# Patient Record
Sex: Female | Born: 1965 | Race: Black or African American | Hispanic: No | Marital: Single | Smoking: Never smoker
Health system: Southern US, Community
[De-identification: ages and names within clinical notes are randomized; demographics above are authoritative.]

## PROBLEM LIST (undated history)

## (undated) DIAGNOSIS — C189 Malignant neoplasm of colon, unspecified: Secondary | ICD-10-CM

## (undated) DIAGNOSIS — J45909 Unspecified asthma, uncomplicated: Secondary | ICD-10-CM

## (undated) DIAGNOSIS — E039 Hypothyroidism, unspecified: Secondary | ICD-10-CM

## (undated) DIAGNOSIS — I10 Essential (primary) hypertension: Secondary | ICD-10-CM

## (undated) DIAGNOSIS — E282 Polycystic ovarian syndrome: Secondary | ICD-10-CM

## (undated) HISTORY — PX: COLON SURGERY: SHX602

## (undated) HISTORY — PX: ABDOMINAL HYSTERECTOMY: SHX81

---

## 2007-06-27 DIAGNOSIS — C20 Malignant neoplasm of rectum: Secondary | ICD-10-CM | POA: Insufficient documentation

## 2008-04-20 DIAGNOSIS — Z85048 Personal history of other malignant neoplasm of rectum, rectosigmoid junction, and anus: Secondary | ICD-10-CM | POA: Insufficient documentation

## 2008-04-20 HISTORY — PX: COLOSTOMY: SHX63

## 2008-04-20 HISTORY — PX: ABDOMINOPERINEAL PROCTOCOLECTOMY: SUR8

## 2011-07-18 DIAGNOSIS — K432 Incisional hernia without obstruction or gangrene: Secondary | ICD-10-CM | POA: Insufficient documentation

## 2011-07-18 DIAGNOSIS — IMO0002 Reserved for concepts with insufficient information to code with codable children: Secondary | ICD-10-CM | POA: Insufficient documentation

## 2013-01-26 DIAGNOSIS — D704 Cyclic neutropenia: Secondary | ICD-10-CM | POA: Insufficient documentation

## 2015-09-19 DIAGNOSIS — I10 Essential (primary) hypertension: Secondary | ICD-10-CM | POA: Diagnosis present

## 2015-09-19 DIAGNOSIS — F5101 Primary insomnia: Secondary | ICD-10-CM | POA: Diagnosis present

## 2015-09-19 DIAGNOSIS — D649 Anemia, unspecified: Secondary | ICD-10-CM | POA: Insufficient documentation

## 2015-09-19 DIAGNOSIS — K219 Gastro-esophageal reflux disease without esophagitis: Secondary | ICD-10-CM | POA: Diagnosis present

## 2015-09-25 DIAGNOSIS — Z6841 Body Mass Index (BMI) 40.0 and over, adult: Secondary | ICD-10-CM

## 2015-09-25 DIAGNOSIS — E039 Hypothyroidism, unspecified: Secondary | ICD-10-CM | POA: Insufficient documentation

## 2015-09-25 DIAGNOSIS — E282 Polycystic ovarian syndrome: Secondary | ICD-10-CM | POA: Diagnosis present

## 2015-10-07 ENCOUNTER — Emergency Department (HOSPITAL_BASED_OUTPATIENT_CLINIC_OR_DEPARTMENT_OTHER)
Admission: EM | Admit: 2015-10-07 | Discharge: 2015-10-07 | Disposition: A | Discharge status: Home / Self Care | Payer: BLUE CROSS/BLUE SHIELD | Attending: Emergency Medicine | Admitting: Emergency Medicine

## 2015-10-07 ENCOUNTER — Emergency Department (HOSPITAL_BASED_OUTPATIENT_CLINIC_OR_DEPARTMENT_OTHER): Payer: BLUE CROSS/BLUE SHIELD

## 2015-10-07 ENCOUNTER — Encounter (HOSPITAL_BASED_OUTPATIENT_CLINIC_OR_DEPARTMENT_OTHER): Payer: Self-pay | Admitting: *Deleted

## 2015-10-07 DIAGNOSIS — K439 Ventral hernia without obstruction or gangrene: Secondary | ICD-10-CM

## 2015-10-07 DIAGNOSIS — Z7984 Long term (current) use of oral hypoglycemic drugs: Secondary | ICD-10-CM | POA: Insufficient documentation

## 2015-10-07 DIAGNOSIS — Z85038 Personal history of other malignant neoplasm of large intestine: Secondary | ICD-10-CM | POA: Insufficient documentation

## 2015-10-07 DIAGNOSIS — Z7982 Long term (current) use of aspirin: Secondary | ICD-10-CM | POA: Diagnosis not present

## 2015-10-07 DIAGNOSIS — R1013 Epigastric pain: Secondary | ICD-10-CM

## 2015-10-07 DIAGNOSIS — R112 Nausea with vomiting, unspecified: Secondary | ICD-10-CM

## 2015-10-07 DIAGNOSIS — I10 Essential (primary) hypertension: Secondary | ICD-10-CM | POA: Insufficient documentation

## 2015-10-07 DIAGNOSIS — E119 Type 2 diabetes mellitus without complications: Secondary | ICD-10-CM | POA: Diagnosis not present

## 2015-10-07 DIAGNOSIS — J45909 Unspecified asthma, uncomplicated: Secondary | ICD-10-CM | POA: Insufficient documentation

## 2015-10-07 HISTORY — DX: Essential (primary) hypertension: I10

## 2015-10-07 HISTORY — DX: Polycystic ovarian syndrome: E28.2

## 2015-10-07 HISTORY — DX: Malignant neoplasm of colon, unspecified: C18.9

## 2015-10-07 HISTORY — DX: Unspecified asthma, uncomplicated: J45.909

## 2015-10-07 LAB — URINALYSIS, ROUTINE W REFLEX MICROSCOPIC
BILIRUBIN URINE: NEGATIVE
Glucose, UA: NEGATIVE mg/dL
Hgb urine dipstick: NEGATIVE
KETONES UR: NEGATIVE mg/dL
LEUKOCYTES UA: NEGATIVE
NITRITE: NEGATIVE
PH: 7 (ref 5.0–8.0)
Protein, ur: NEGATIVE mg/dL
Specific Gravity, Urine: 1.007 (ref 1.005–1.030)

## 2015-10-07 LAB — CBC WITH DIFFERENTIAL/PLATELET
BASOS ABS: 0 10*3/uL (ref 0.0–0.1)
BASOS PCT: 0 %
Eosinophils Absolute: 0 10*3/uL (ref 0.0–0.7)
Eosinophils Relative: 1 %
HEMATOCRIT: 38.3 % (ref 36.0–46.0)
HEMOGLOBIN: 12.6 g/dL (ref 12.0–15.0)
Lymphocytes Relative: 16 %
Lymphs Abs: 0.9 10*3/uL (ref 0.7–4.0)
MCH: 27.3 pg (ref 26.0–34.0)
MCHC: 32.9 g/dL (ref 30.0–36.0)
MCV: 83.1 fL (ref 78.0–100.0)
Monocytes Absolute: 0.5 10*3/uL (ref 0.1–1.0)
Monocytes Relative: 8 %
NEUTROS ABS: 4.5 10*3/uL (ref 1.7–7.7)
NEUTROS PCT: 75 %
Platelets: 256 10*3/uL (ref 150–400)
RBC: 4.61 MIL/uL (ref 3.87–5.11)
RDW: 14.2 % (ref 11.5–15.5)
WBC: 5.9 10*3/uL (ref 4.0–10.5)

## 2015-10-07 LAB — COMPREHENSIVE METABOLIC PANEL
ALBUMIN: 4.3 g/dL (ref 3.5–5.0)
ALT: 34 U/L (ref 14–54)
ANION GAP: 10 (ref 5–15)
AST: 29 U/L (ref 15–41)
Alkaline Phosphatase: 74 U/L (ref 38–126)
BILIRUBIN TOTAL: 0.8 mg/dL (ref 0.3–1.2)
BUN: 12 mg/dL (ref 6–20)
CO2: 26 mmol/L (ref 22–32)
Calcium: 9.3 mg/dL (ref 8.9–10.3)
Chloride: 100 mmol/L — ABNORMAL LOW (ref 101–111)
Creatinine, Ser: 0.76 mg/dL (ref 0.44–1.00)
GFR calc Af Amer: >60 mL/min (ref >60)
GLUCOSE: 113 mg/dL — AB (ref 65–99)
POTASSIUM: 3.8 mmol/L (ref 3.5–5.1)
Sodium: 136 mmol/L (ref 135–145)
TOTAL PROTEIN: 8.3 g/dL — AB (ref 6.5–8.1)

## 2015-10-07 LAB — LIPASE, BLOOD: LIPASE: 23 U/L (ref 11–51)

## 2015-10-07 MED ORDER — SODIUM CHLORIDE 0.9 % IV SOLN
Freq: Once | INTRAVENOUS | Status: AC
  Administered 2015-10-07: 07:00:00 via INTRAVENOUS

## 2015-10-07 MED ORDER — ONDANSETRON 4 MG PO TBDP: 4.0000 mg | ORAL_TABLET | Freq: Three times a day (TID) | ORAL | 0 refills | Status: DC | PRN

## 2015-10-07 MED ORDER — FENTANYL CITRATE (PF) 100 MCG/2ML IJ SOLN: 100.0000 ug | INTRAMUSCULAR | Status: DC | PRN

## 2015-10-07 MED ORDER — ONDANSETRON HCL 4 MG/2ML IJ SOLN
4.0000 mg | Freq: Once | INTRAMUSCULAR | Status: AC
Start: 2015-10-07 — End: 2015-10-07
  Administered 2015-10-07: 4 mg via INTRAVENOUS
  Filled 2015-10-07: qty 2

## 2015-10-07 MED ORDER — IOPAMIDOL (ISOVUE-300) INJECTION 61%
100.0000 mL | Freq: Once | INTRAVENOUS | Status: AC | PRN
  Administered 2015-10-07: 100 mL via INTRAVENOUS

## 2015-10-07 NOTE — ED Triage Notes (Signed)
Pt reports generalized abdominal pain x 1 day.  N/V that started this morning.  Vomited x 1 today-relief with vomiting.  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

## 2015-10-07 NOTE — ED Provider Notes (Signed)
[facility] DEPT [facility] Provider Note   CSN: [id number] Arrival date & time: 10/07/15  0608  First Provider Contact:  None       History   Chief Complaint Chief Complaint  Patient presents with  . Abdominal Pain    HPI Cindy Peterson is a 50 y.o. 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 She is here with abdominal pain that began yesterday morning. The pain is located primarily in the epigastrium and radiates around to the back. It is crampy in nature and has been waxing and waning. It is been severe at its worst but is currently about a 6/10. She had an episode of nausea and vomiting this morning about 5:30. She had multiple episodes of emesis and around which has subsequently subsided. She denies fever, abdominal distention or change in her colostomy output.  HPI  Past Medical History:  Diagnosis Date  . Asthma   . Colon cancer ([place])   . Diabetes mellitus without complication ([place])   . Hypertension   . PCOS (polycystic ovarian syndrome)     There are no active problems to display for this patient.   Past Surgical History:  Procedure Laterality Date  . ABDOMINAL HYSTERECTOMY    . COLON SURGERY    . COLOSTOMY      OB History    No data available       Home Medications    Prior to Admission medications   Medication Sig Start Date End Date Taking? Authorizing Provider  aspirin 81 MG tablet Take 81 mg by mouth daily.   Yes Historical Provider, MD  fluticasone (FLOVENT HFA) 44 MCG/ACT inhaler Inhale into the lungs 2 (two) times daily.   Yes Historical Provider, MD  losartan-hydrochlorothiazide (HYZAAR) 100-12.5 MG tablet Take 1 tablet by mouth daily.   Yes Historical Provider, MD  metFORMIN (GLUCOPHAGE) 500 MG tablet Take by mouth 2 (two) times daily with a meal.   Yes Historical Provider, MD  ondansetron (ZOFRAN ODT) 4 MG disintegrating tablet Take 1 tablet (4 mg total) by mouth every 8  (eight) hours as needed for nausea or vomiting. 10/07/15   Sherwood Gambler, MD    Family History History reviewed. No pertinent family history.  Social History Social History  Substance Use Topics  . Smoking status: Never Smoker  . Smokeless tobacco: Never Used  . Alcohol use No     Allergies   Latex   Review of Systems Review of Systems  All other systems reviewed and are negative.    Physical Exam Updated Vital Signs BP 111/87 (BP Location: Right Arm)   Pulse 92   Temp 98.3 F (36.8 C) (Oral)   Resp 16   Ht 5' 3.5" (1.613 m)   Wt 250 lb (113.4 kg)   SpO2 96%   BMI 43.59 kg/m   Physical Exam General: Well-developed, well-nourished female in no acute distress; appearance consistent with age of record HENT: normocephalic; atraumatic Eyes: pupils equal, round and reactive to light; extraocular muscles intact Neck: supple Heart: regular rate and rhythm Lungs: clear to auscultation bilaterally Abdomen: soft; nondistended; epigastric tenderness; no masses or hepatosplenomegaly; bowel sounds present; colostomy in left lower quadrant Extremities: No deformity; full range of motion; pulses normal Neurologic: Awake, alert and oriented; motor function intact in all extremities and symmetric; no facial droop Skin: Warm and dry Psychiatric: Normal mood and affect    ED Treatments / Results  Nursing  notes and vitals signs, including pulse oximetry, reviewed.  Summary of this visit's results, reviewed by myself:  Labs:  Results for orders placed or performed during the hospital encounter of 10/07/15 (from the past 24 hour(s))  Comprehensive metabolic panel     Status: Abnormal   Collection Time: 10/07/15  6:50 AM  Result Value Ref Range   Sodium 136 135 - 145 mmol/L   Potassium 3.8 3.5 - 5.1 mmol/L   Chloride 100 (L) 101 - 111 mmol/L   CO2 26 22 - 32 mmol/L   Glucose, Bld 113 (H) 65 - 99 mg/dL   BUN 12 6 - 20 mg/dL   Creatinine, Ser 0.76 0.44 - 1.00 mg/dL    Calcium 9.3 8.9 - 10.3 mg/dL   Total Protein 8.3 (H) 6.5 - 8.1 g/dL   Albumin 4.3 3.5 - 5.0 g/dL   AST 29 15 - 41 U/L   ALT 34 14 - 54 U/L   Alkaline Phosphatase 74 38 - 126 U/L   Total Bilirubin 0.8 0.3 - 1.2 mg/dL   GFR calc non Af Amer >60 >60 mL/min   GFR calc Af Amer >60 >60 mL/min   Anion gap 10 5 - 15  Lipase, blood     Status: None   Collection Time: 10/07/15  6:50 AM  Result Value Ref Range   Lipase 23 11 - 51 U/L  CBC with Differential/Platelet     Status: None   Collection Time: 10/07/15  6:50 AM  Result Value Ref Range   WBC 5.9 4.0 - 10.5 K/uL   RBC 4.61 3.87 - 5.11 MIL/uL   Hemoglobin 12.6 12.0 - 15.0 g/dL   HCT 38.3 36.0 - 46.0 %   MCV 83.1 78.0 - 100.0 fL   MCH 27.3 26.0 - 34.0 pg   MCHC 32.9 30.0 - 36.0 g/dL   RDW 14.2 11.5 - 15.5 %   Platelets 256 150 - 400 K/uL   Neutrophils Relative % 75 %   Neutro Abs 4.5 1.7 - 7.7 K/uL   Lymphocytes Relative 16 %   Lymphs Abs 0.9 0.7 - 4.0 K/uL   Monocytes Relative 8 %   Monocytes Absolute 0.5 0.1 - 1.0 K/uL   Eosinophils Relative 1 %   Eosinophils Absolute 0.0 0.0 - 0.7 K/uL   Basophils Relative 0 %   Basophils Absolute 0.0 0.0 - 0.1 K/uL  Urinalysis, Routine w reflex microscopic (not at [facility])     Status: None   Collection Time: 10/07/15  7:53 AM  Result Value Ref Range   Color, Urine YELLOW YELLOW   APPearance CLEAR CLEAR   Specific Gravity, Urine 1.007 1.005 - 1.030   pH 7.0 5.0 - 8.0   Glucose, UA NEGATIVE NEGATIVE mg/dL   Hgb urine dipstick NEGATIVE NEGATIVE   Bilirubin Urine NEGATIVE NEGATIVE   Ketones, ur NEGATIVE NEGATIVE mg/dL   Protein, ur NEGATIVE NEGATIVE mg/dL   Nitrite NEGATIVE NEGATIVE   Leukocytes, UA NEGATIVE NEGATIVE    Imaging Studies: Ct Abdomen Pelvis W Contrast  Result Date: 10/07/2015 CLINICAL DATA:  Epigastric pain, nausea, and vomiting for 1 day. Colon cancer with colostomy. EXAM: CT ABDOMEN AND PELVIS WITH CONTRAST TECHNIQUE: Multidetector CT imaging of the abdomen and pelvis was  performed using the standard protocol following bolus administration of intravenous contrast. CONTRAST:  12mL ISOVUE-300 IOPAMIDOL (ISOVUE-300) INJECTION 61% COMPARISON:  04/21/2009 FINDINGS: No lung base nodules. Soft tissue density is layering in the gallbladder. There are calcifications either at the periphery of this  density or in the wall of the gallbladder. No wall thickening of the gallbladder. Liver, spleen, pancreas, adrenal glands, and kidneys are within normal limits The patient is status post AP are. Left lower quadrant colostomy is stable. A peristomal hernia in the left lower quadrant containing small bowel loops is again noted. The hernia has worsened with an increasing amount of small bowel. There are also 2 adjacent midline hernia defects containing bowel loops. The most inferior hernia sac contains a bowel loop with an air-fluid level. This segment of small bowel is mildly dilated and there is wall thickened. There is possible edema in the adjacent mesenteric. See images 57 through 63 of series 2. There is no dilatation of proximal small bowel to suggest obstruction. Air-fluid levels throughout the colon are nonspecific. The see come is in the deep pelvis and the appendix is normal. Presacral soft tissue thickening is stable. There is no free fluid. There is no abnormal retroperitoneal adenopathy by measurement criteria. Soft tissue adjacent to the left external iliac artery on image 65 is stable and likely represents ovarian tissue. No vertebral compression deformity. Anterolisthesis L4 upon L5. Advanced facet arthropathy in the lumbar spine. IMPRESSION: There are multiple lower abdominal anterior hernia sacs as described. The most inferior a midline hernia sac contains a loop of small bowel with wall thickening. An inflammatory process and/or incarcerated hernia with venous congestion cannot be excluded. There is no evidence of bowel obstruction. Small bowel loops within the left lower quadrant  peristomal hernia have increased. There are calcifications within the region of the gallbladder representing either gallstones or gallbladder wall calcification. Ultrasound may be helpful. Electronically Signed   By: Marybelle Killings M.D.   On: 10/07/2015 08:50     Procedures (including critical care time)   Final Clinical Impressions(s) / ED Diagnoses   Final diagnoses:  Epigastric pain  Ventral hernia without obstruction or gangrene  Nausea and vomiting in adult      Shanon Rosser, MD 10/07/15 2244

## 2015-10-07 NOTE — ED Provider Notes (Signed)
Patient feels well. Reports no abdominal pain, no current nausea. On my exam I do not feel an obvious hernia or obvious tenderness. CT shows hernia with possible early incarceration be given no pain at this is less likely. Discussed with Dr. Lucia Gaskins of [place], who reviewed CT scan and feels she is stable for discharge home, which I agree. She is to follow-up with her surgeon who she can follow-up with about possible outpatient fixation. I did discussed strict return precautions including recurrent abdominal pain, nausea, vomiting, or other concerning symptoms.  Results for orders placed or performed during the hospital encounter of 10/07/15  Urinalysis, Routine w reflex microscopic (not at [facility])  Result Value Ref Range   Color, Urine YELLOW YELLOW   APPearance CLEAR CLEAR   Specific Gravity, Urine 1.007 1.005 - 1.030   pH 7.0 5.0 - 8.0   Glucose, UA NEGATIVE NEGATIVE mg/dL   Hgb urine dipstick NEGATIVE NEGATIVE   Bilirubin Urine NEGATIVE NEGATIVE   Ketones, ur NEGATIVE NEGATIVE mg/dL   Protein, ur NEGATIVE NEGATIVE mg/dL   Nitrite NEGATIVE NEGATIVE   Leukocytes, UA NEGATIVE NEGATIVE  Comprehensive metabolic panel  Result Value Ref Range   Sodium 136 135 - 145 mmol/L   Potassium 3.8 3.5 - 5.1 mmol/L   Chloride 100 (L) 101 - 111 mmol/L   CO2 26 22 - 32 mmol/L   Glucose, Bld 113 (H) 65 - 99 mg/dL   BUN 12 6 - 20 mg/dL   Creatinine, Ser 0.76 0.44 - 1.00 mg/dL   Calcium 9.3 8.9 - 10.3 mg/dL   Total Protein 8.3 (H) 6.5 - 8.1 g/dL   Albumin 4.3 3.5 - 5.0 g/dL   AST 29 15 - 41 U/L   ALT 34 14 - 54 U/L   Alkaline Phosphatase 74 38 - 126 U/L   Total Bilirubin 0.8 0.3 - 1.2 mg/dL   GFR calc non Af Amer >60 >60 mL/min   GFR calc Af Amer >60 >60 mL/min   Anion gap 10 5 - 15  Lipase, blood  Result Value Ref Range   Lipase 23 11 - 51 U/L  CBC with Differential/Platelet  Result Value Ref Range   WBC 5.9 4.0 - 10.5 K/uL   RBC 4.61 3.87 - 5.11 MIL/uL   Hemoglobin 12.6 12.0 - 15.0 g/dL   HCT 38.3  36.0 - 46.0 %   MCV 83.1 78.0 - 100.0 fL   MCH 27.3 26.0 - 34.0 pg   MCHC 32.9 30.0 - 36.0 g/dL   RDW 14.2 11.5 - 15.5 %   Platelets 256 150 - 400 K/uL   Neutrophils Relative % 75 %   Neutro Abs 4.5 1.7 - 7.7 K/uL   Lymphocytes Relative 16 %   Lymphs Abs 0.9 0.7 - 4.0 K/uL   Monocytes Relative 8 %   Monocytes Absolute 0.5 0.1 - 1.0 K/uL   Eosinophils Relative 1 %   Eosinophils Absolute 0.0 0.0 - 0.7 K/uL   Basophils Relative 0 %   Basophils Absolute 0.0 0.0 - 0.1 K/uL   Ct Abdomen Pelvis W Contrast  Result Date: 10/07/2015 CLINICAL DATA:  Epigastric pain, nausea, and vomiting for 1 day. Colon cancer with colostomy. EXAM: CT ABDOMEN AND PELVIS WITH CONTRAST TECHNIQUE: Multidetector CT imaging of the abdomen and pelvis was performed using the standard protocol following bolus administration of intravenous contrast. CONTRAST:  159mL ISOVUE-300 IOPAMIDOL (ISOVUE-300) INJECTION 61% COMPARISON:  04/21/2009 FINDINGS: No lung base nodules. Soft tissue density is layering in the gallbladder. There  are calcifications either at the periphery of this density or in the wall of the gallbladder. No wall thickening of the gallbladder. Liver, spleen, pancreas, adrenal glands, and kidneys are within normal limits The patient is status post AP are. Left lower quadrant colostomy is stable. A peristomal hernia in the left lower quadrant containing small bowel loops is again noted. The hernia has worsened with an increasing amount of small bowel. There are also 2 adjacent midline hernia defects containing bowel loops. The most inferior hernia sac contains a bowel loop with an air-fluid level. This segment of small bowel is mildly dilated and there is wall thickened. There is possible edema in the adjacent mesenteric. See images 57 through 63 of series 2. There is no dilatation of proximal small bowel to suggest obstruction. Air-fluid levels throughout the colon are nonspecific. The see come is in the deep pelvis and  the appendix is normal. Presacral soft tissue thickening is stable. There is no free fluid. There is no abnormal retroperitoneal adenopathy by measurement criteria. Soft tissue adjacent to the left external iliac artery on image 65 is stable and likely represents ovarian tissue. No vertebral compression deformity. Anterolisthesis L4 upon L5. Advanced facet arthropathy in the lumbar spine. IMPRESSION: There are multiple lower abdominal anterior hernia sacs as described. The most inferior a midline hernia sac contains a loop of small bowel with wall thickening. An inflammatory process and/or incarcerated hernia with venous congestion cannot be excluded. There is no evidence of bowel obstruction. Small bowel loops within the left lower quadrant peristomal hernia have increased. There are calcifications within the region of the gallbladder representing either gallstones or gallbladder wall calcification. Ultrasound may be helpful. Electronically Signed   By: Marybelle Killings M.D.   On: 10/07/2015 08:50      Sherwood Gambler, MD 10/07/15 [phone]

## 2016-04-04 DIAGNOSIS — R7989 Other specified abnormal findings of blood chemistry: Secondary | ICD-10-CM | POA: Insufficient documentation

## 2016-05-23 DIAGNOSIS — J4531 Mild persistent asthma with (acute) exacerbation: Secondary | ICD-10-CM | POA: Insufficient documentation

## 2016-12-06 DIAGNOSIS — H50112 Monocular exotropia, left eye: Secondary | ICD-10-CM | POA: Insufficient documentation

## 2016-12-06 DIAGNOSIS — H04123 Dry eye syndrome of bilateral lacrimal glands: Secondary | ICD-10-CM | POA: Insufficient documentation

## 2016-12-06 DIAGNOSIS — Z83511 Family history of glaucoma: Secondary | ICD-10-CM | POA: Insufficient documentation

## 2016-12-06 DIAGNOSIS — H5203 Hypermetropia, bilateral: Secondary | ICD-10-CM | POA: Insufficient documentation

## 2017-03-05 DIAGNOSIS — D509 Iron deficiency anemia, unspecified: Secondary | ICD-10-CM | POA: Insufficient documentation

## 2017-04-05 ENCOUNTER — Emergency Department (HOSPITAL_BASED_OUTPATIENT_CLINIC_OR_DEPARTMENT_OTHER)
Admission: EM | Admit: 2017-04-05 | Discharge: 2017-04-05 | Disposition: A | Discharge status: Home / Self Care | Payer: Managed Care, Other (non HMO) | Attending: Physician Assistant | Admitting: Physician Assistant

## 2017-04-05 ENCOUNTER — Other Ambulatory Visit: Payer: Self-pay

## 2017-04-05 ENCOUNTER — Encounter (HOSPITAL_BASED_OUTPATIENT_CLINIC_OR_DEPARTMENT_OTHER): Payer: Self-pay | Admitting: *Deleted

## 2017-04-05 DIAGNOSIS — J111 Influenza due to unidentified influenza virus with other respiratory manifestations: Secondary | ICD-10-CM

## 2017-04-05 DIAGNOSIS — J45909 Unspecified asthma, uncomplicated: Secondary | ICD-10-CM | POA: Insufficient documentation

## 2017-04-05 DIAGNOSIS — Z79899 Other long term (current) drug therapy: Secondary | ICD-10-CM | POA: Insufficient documentation

## 2017-04-05 DIAGNOSIS — I10 Essential (primary) hypertension: Secondary | ICD-10-CM | POA: Insufficient documentation

## 2017-04-05 DIAGNOSIS — Z85038 Personal history of other malignant neoplasm of large intestine: Secondary | ICD-10-CM | POA: Insufficient documentation

## 2017-04-05 DIAGNOSIS — R69 Illness, unspecified: Secondary | ICD-10-CM

## 2017-04-05 DIAGNOSIS — R509 Fever, unspecified: Secondary | ICD-10-CM | POA: Diagnosis present

## 2017-04-05 DIAGNOSIS — Z91040 Latex allergy status: Secondary | ICD-10-CM | POA: Insufficient documentation

## 2017-04-05 MED ORDER — ACETAMINOPHEN 325 MG PO TABS
650.0000 mg | ORAL_TABLET | Freq: Once | ORAL | Status: AC
  Administered 2017-04-05: 650 mg via ORAL

## 2017-04-05 MED ORDER — IBUPROFEN 800 MG PO TABS
800.0000 mg | ORAL_TABLET | Freq: Once | ORAL | Status: AC
  Administered 2017-04-05: 800 mg via ORAL
  Filled 2017-04-05: qty 1

## 2017-04-05 MED ORDER — ACETAMINOPHEN 325 MG PO TABS
ORAL_TABLET | ORAL | Status: AC
  Filled 2017-04-05: qty 2

## 2017-04-05 NOTE — ED Notes (Signed)
Given OJ to drink.

## 2017-04-05 NOTE — ED Provider Notes (Signed)
[facility] EMERGENCY DEPARTMENT Provider Note   CSN: [id number] Arrival date & time: 04/05/17  1844     History   Chief Complaint Chief Complaint  Patient presents with  . URI    HPI Paulina Muchmore is a 52 y.o. female.  HPI   Is a 52 year old female presenting with fever, body aches.  Patient saw her primary physician yesterday who diagnosed her with a viral illness.  She is worried that she has a flu because her family members had flu recently.  She did not have a fever yesterday when she saw a physician now she is febrile, similar symptoms.  The symptoms are consistent with the flu.  Area of the cast, colon cancer, hypertension.  Patient had no vomiting, has not take any medications to help with fever.  Past Medical History:  Diagnosis Date  . Asthma   . Colon cancer ([place])   . Hypertension   . PCOS (polycystic ovarian syndrome)     There are no active problems to display for this patient.   Past Surgical History:  Procedure Laterality Date  . ABDOMINAL HYSTERECTOMY    . COLON SURGERY    . COLOSTOMY      OB History    No data available       Home Medications    Prior to Admission medications   Medication Sig Start Date End Date Taking? Authorizing Provider  omeprazole (PRILOSEC) 20 MG capsule Take 20 mg by mouth daily.   Yes [provider]  zolpidem (AMBIEN) 5 MG tablet Take 5 mg by mouth at bedtime as needed for sleep.   Yes [provider]  fluticasone (FLOVENT HFA) 44 MCG/ACT inhaler Inhale into the lungs 2 (two) times daily.    [provider]  losartan-hydrochlorothiazide (HYZAAR) 100-12.5 MG tablet Take 1 tablet by mouth daily.    [provider]  metFORMIN (GLUCOPHAGE) 500 MG tablet Take by mouth 2 (two) times daily with a meal.    [provider]    Family History History reviewed. No pertinent family history.  Social History Social History   Tobacco Use  . Smoking status: Never Smoker    . Smokeless tobacco: Never Used  Substance Use Topics  . Alcohol use: No  . Drug use: No     Allergies   Latex   Review of Systems Review of Systems  Constitutional: Positive for fatigue and fever. Negative for activity change.  HENT: Positive for congestion.   Respiratory: Positive for cough. Negative for shortness of breath.   Cardiovascular: Negative for chest pain.  Gastrointestinal: Negative for abdominal pain.  Neurological: Positive for headaches.  All other systems reviewed and are negative.    Physical Exam Updated Vital Signs BP (!) 143/95 (BP Location: Left Arm)   Pulse (!) 118   Temp (!) 101 F (38.3 C) (Oral)   Resp 20   Ht 5\' 3"  (1.6 m)   Wt 122.5 kg (270 lb)   SpO2 98%   BMI 47.83 kg/m   Physical Exam  Constitutional: She is oriented to person, place, and time. She appears well-developed and well-nourished.  HENT:  Head: Normocephalic and atraumatic.  Eyes: EOM are normal. Pupils are equal, round, and reactive to light. Right eye exhibits no discharge. Left eye exhibits no discharge.  Neck:  movign neck normally.  Cardiovascular: Regular rhythm.  Tachycardic.  Pulmonary/Chest: Effort normal and breath sounds normal. No respiratory distress.  Neurological: She is oriented to person, place, and time.  Skin: Skin is warm and dry. She is not diaphoretic.  Psychiatric: She has a normal mood and affect.  Nursing note and vitals reviewed.    ED Treatments / Results  Labs (all labs ordered are listed, but only abnormal results are displayed) Labs Reviewed - No data to display  EKG  EKG Interpretation None       Radiology No results found.  Procedures Procedures (including critical care time)  Medications Ordered in ED Medications  acetaminophen (TYLENOL) 325 MG tablet (not administered)  acetaminophen (TYLENOL) tablet 650 mg (650 mg Oral Given 04/05/17 1909)     Initial Impression / Assessment and Plan / ED Course  I have reviewed  the triage vital signs and the nursing notes.  Pertinent labs & imaging results that were available during my care of the patient were reviewed by me and considered in my medical decision making (see chart for details).     Is a 52 year old female presenting with fever, body aches.  Patient saw her primary physician yesterday who diagnosed her with a viral illness.  She is worried that she has a flu because her family members had flu recently.  She did not have a fever yesterday when she saw a physician now she is febrile, similar symptoms.  The symptoms are consistent with the flu.  Area of the cast, colon cancer, hypertension.  Patient had no vomiting, has not take any medications to help with fever.  9:12 PM Believe patient has a flu.  Discussed Tamiflu with her, patient does not think that it would be a good financial decision for her.  We will give her room for supportive care, ibuprofen, Tylenol, work note.   Final Clinical Impressions(s) / ED Diagnoses   Final diagnoses:  None    ED Discharge Orders    None       Macarthur Critchley, MD 04/05/17 [phone]

## 2017-04-05 NOTE — Discharge Instructions (Signed)
Please take ibuprofen and Tylenol to help with your symptoms.  Please rest and drink plenty of fluids.  Please return with any concerns.

## 2017-04-05 NOTE — ED Notes (Signed)
Pt given d/c instructions as per chart. Verbalizes understanding. No questions. 

## 2017-04-05 NOTE — ED Triage Notes (Signed)
Fever, body aches, cough x 1-2 days.

## 2017-09-18 IMAGING — CT CT ABD-PELV W/ CM
2 of 5 series · 16 of 46 positions shown, 18 images · IV contrast (iopamidol)
Comparison: 04/21/2009

CLINICAL DATA: Epigastric pain, nausea, and vomiting for 1 day.
Colon cancer with colostomy.

EXAM:
CT ABDOMEN AND PELVIS WITH CONTRAST
TECHNIQUE: Multidetector CT imaging of the abdomen and pelvis was performed
using the standard protocol following bolus administration of
intravenous contrast.
CONTRAST:  100mL 3G812B-GYY IOPAMIDOL (3G812B-GYY) INJECTION 61%

[Series 2: axial st · axial · 0.98mm/px · z∈[-456,-42]mm · 13 of 95 slices shown, 15 images]
[im 6/95  soft-tissue]
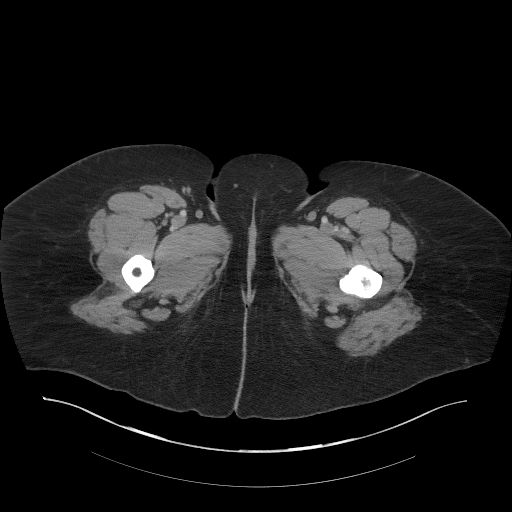
[im 6/95  bone]
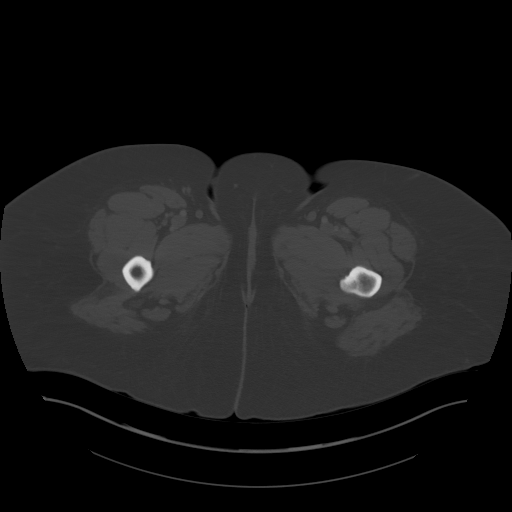
[im 11/95  soft-tissue]
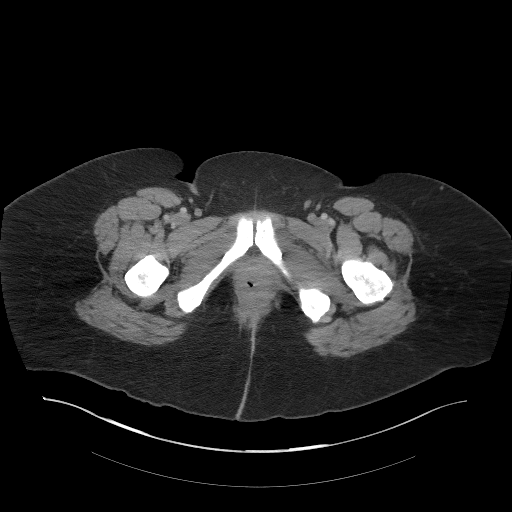
[im 21/95  soft-tissue]
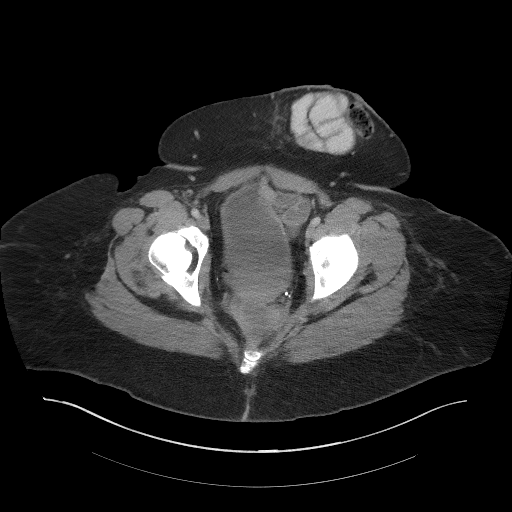
[im 27/95  soft-tissue]
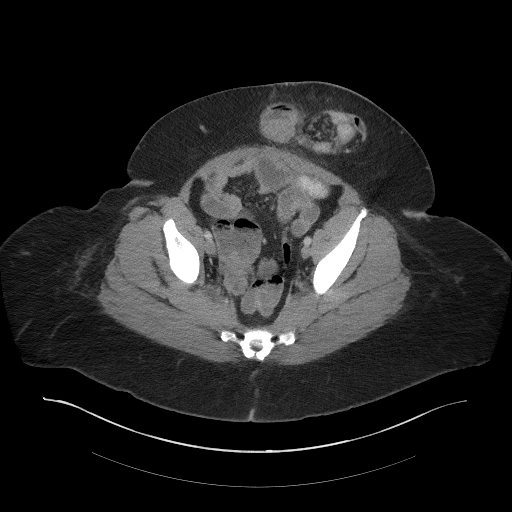
[im 32/95  soft-tissue]
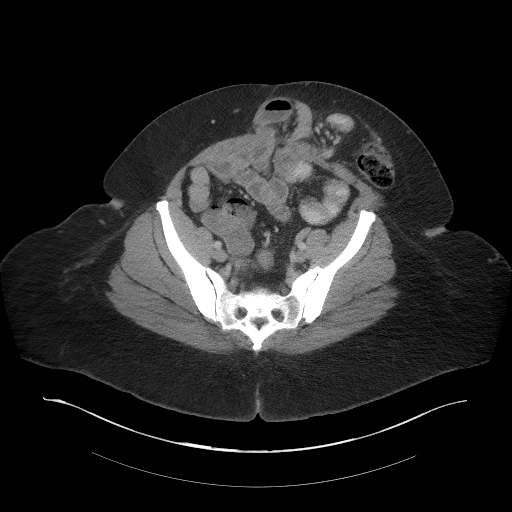
[im 42/95  soft-tissue]
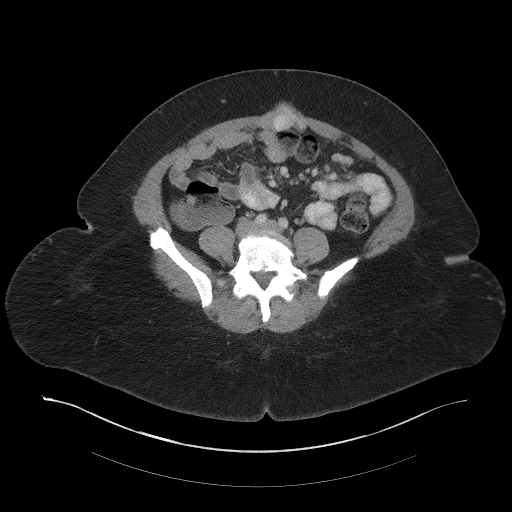
[im 48/95  soft-tissue]
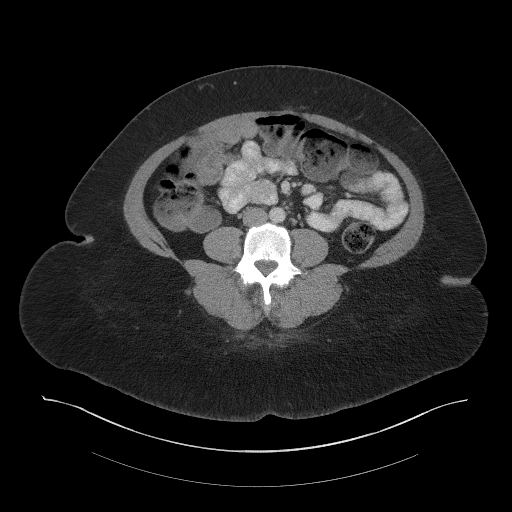
[im 53/95  soft-tissue]
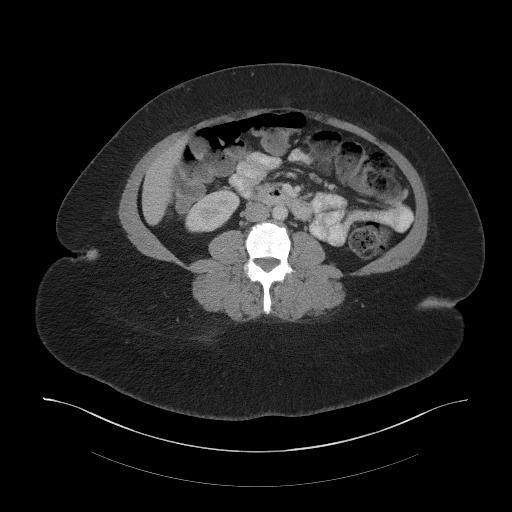
[im 63/95  soft-tissue]
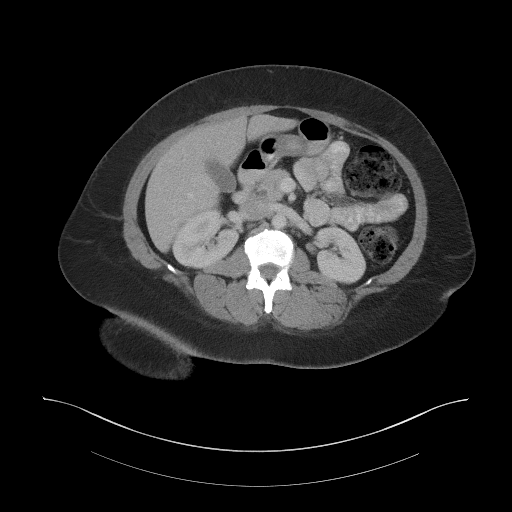
[im 63/95  bone]
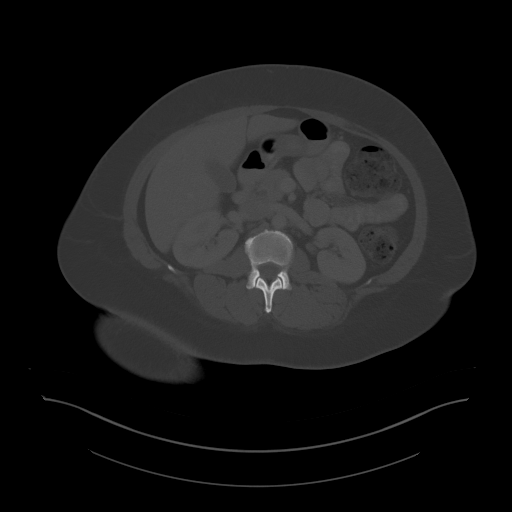
[im 68/95  soft-tissue]
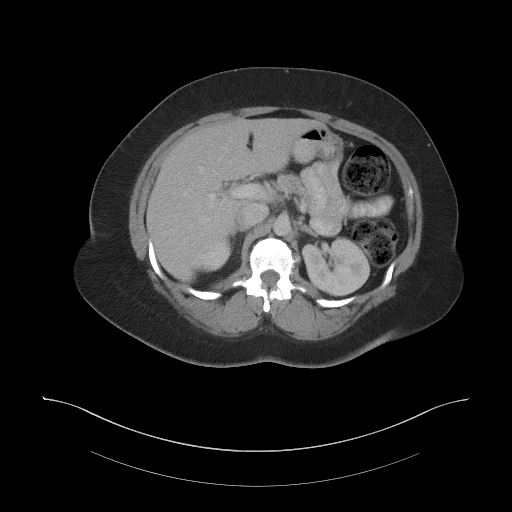
[im 74/95  soft-tissue]
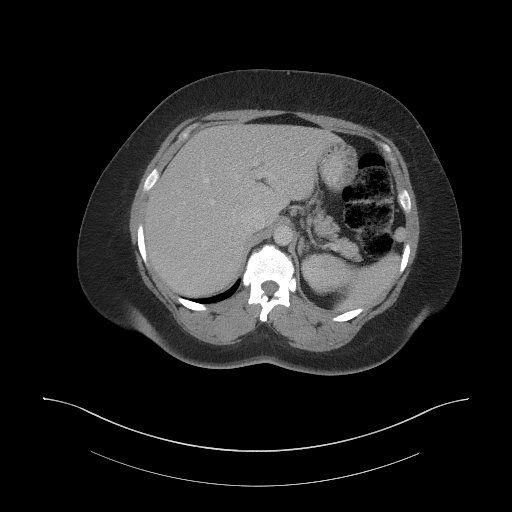
[im 84/95  soft-tissue]
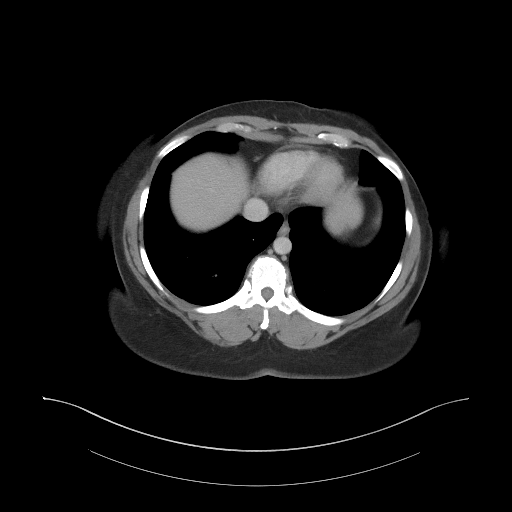
[im 89/95  soft-tissue]
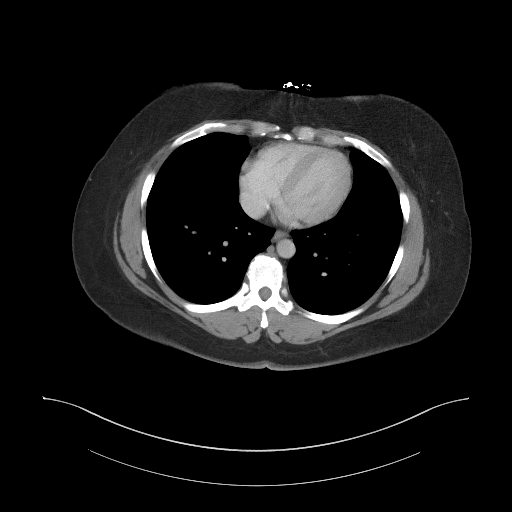

[Series 5: coronal st · coronal · 0.98mm/px · 3 of 92 slices shown]
[im 31/92  soft-tissue]
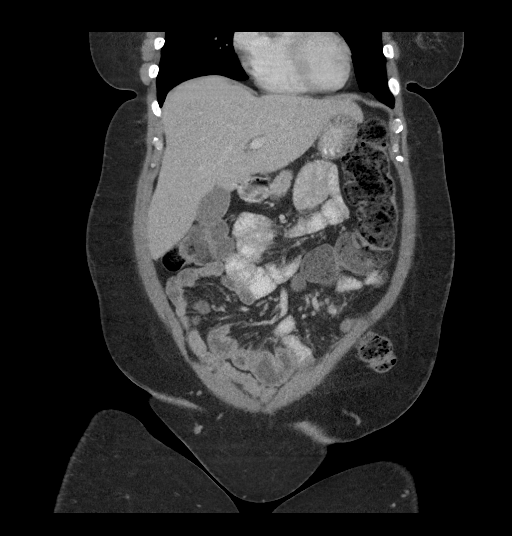
[im 41/92  soft-tissue]
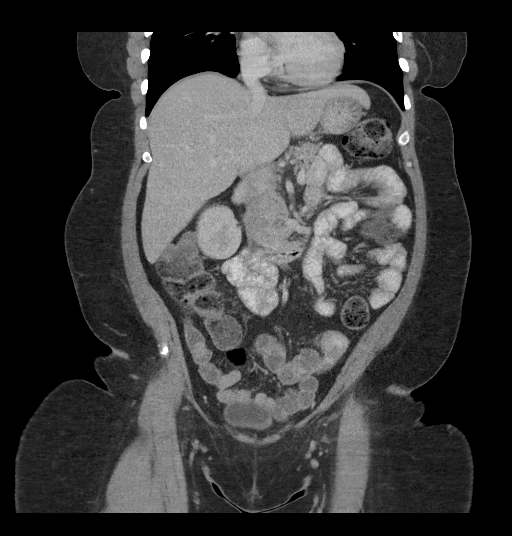
[im 51/92  soft-tissue]
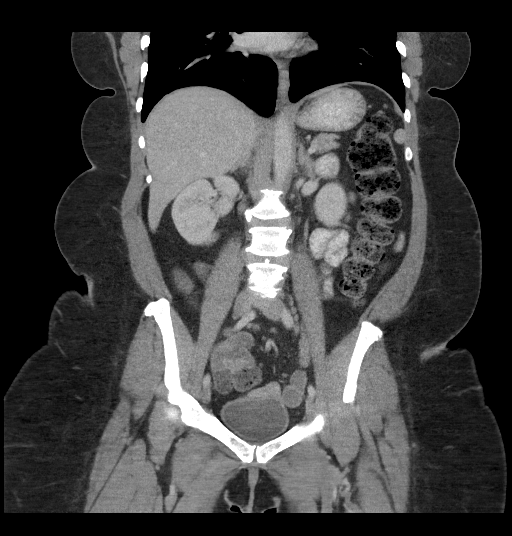

[16 of 46 positions shown; findings below may reference images not displayed]

FINDINGS: No lung base nodules.

Soft tissue density is layering in the gallbladder. There are
calcifications either at the periphery of this density or in the
wall of the gallbladder. No wall thickening of the gallbladder.

Liver, spleen, pancreas, adrenal glands, and kidneys are within
normal limits

The patient is status post AP are. Left lower quadrant colostomy is
stable. A peristomal hernia in the left lower quadrant containing
small bowel loops is again noted. The hernia has worsened with an
increasing amount of small bowel. There are also 2 adjacent midline
hernia defects containing bowel loops. The most inferior hernia sac
contains a bowel loop with an air-fluid level. This segment of small
bowel is mildly dilated and there is wall thickened. There is
possible edema in the adjacent mesenteric. See images 57 through 63
of series 2. There is no dilatation of proximal small bowel to
suggest obstruction. Air-fluid levels throughout the colon are
nonspecific.

The see come is in the deep pelvis and the appendix is normal.
Presacral soft tissue thickening is stable.

There is no free fluid. There is no abnormal retroperitoneal
adenopathy by measurement criteria. Soft tissue adjacent to the left
external iliac artery on image 65 is stable and likely represents
ovarian tissue.

No vertebral compression deformity. Anterolisthesis L4 upon L5.
Advanced facet arthropathy in the lumbar spine.
IMPRESSION: There are multiple lower abdominal anterior hernia sacs as
described. The most inferior a midline hernia sac contains a loop of
small bowel with wall thickening. An inflammatory process and/or
incarcerated hernia with venous congestion cannot be excluded. There
is no evidence of bowel obstruction. Small bowel loops within the
left lower quadrant peristomal hernia have increased.

There are calcifications within the region of the gallbladder
representing either gallstones or gallbladder wall calcification.
Ultrasound may be helpful.

## 2018-09-26 HISTORY — PX: LAPAROSCOPIC CHOLECYSTECTOMY: SUR755

## 2019-10-13 DIAGNOSIS — R7301 Impaired fasting glucose: Secondary | ICD-10-CM | POA: Insufficient documentation

## 2020-01-24 ENCOUNTER — Inpatient Hospital Stay (HOSPITAL_BASED_OUTPATIENT_CLINIC_OR_DEPARTMENT_OTHER)
Admission: EM | Admit: 2020-01-24 | Discharge: 2020-02-08 | DRG: 336 | Disposition: A | Discharge status: Home / Self Care | Payer: BC Managed Care – PPO | Attending: Internal Medicine | Admitting: Internal Medicine

## 2020-01-24 ENCOUNTER — Emergency Department (HOSPITAL_BASED_OUTPATIENT_CLINIC_OR_DEPARTMENT_OTHER): Payer: BC Managed Care – PPO

## 2020-01-24 ENCOUNTER — Other Ambulatory Visit: Payer: Self-pay

## 2020-01-24 ENCOUNTER — Encounter (HOSPITAL_BASED_OUTPATIENT_CLINIC_OR_DEPARTMENT_OTHER): Payer: Self-pay | Admitting: *Deleted

## 2020-01-24 DIAGNOSIS — Z8249 Family history of ischemic heart disease and other diseases of the circulatory system: Secondary | ICD-10-CM | POA: Diagnosis not present

## 2020-01-24 DIAGNOSIS — Z9049 Acquired absence of other specified parts of digestive tract: Secondary | ICD-10-CM

## 2020-01-24 DIAGNOSIS — Z6841 Body Mass Index (BMI) 40.0 and over, adult: Secondary | ICD-10-CM | POA: Diagnosis not present

## 2020-01-24 DIAGNOSIS — E282 Polycystic ovarian syndrome: Secondary | ICD-10-CM | POA: Diagnosis present

## 2020-01-24 DIAGNOSIS — Z20822 Contact with and (suspected) exposure to covid-19: Secondary | ICD-10-CM | POA: Diagnosis present

## 2020-01-24 DIAGNOSIS — Z933 Colostomy status: Secondary | ICD-10-CM | POA: Diagnosis not present

## 2020-01-24 DIAGNOSIS — Z85048 Personal history of other malignant neoplasm of rectum, rectosigmoid junction, and anus: Secondary | ICD-10-CM

## 2020-01-24 DIAGNOSIS — K660 Peritoneal adhesions (postprocedural) (postinfection): Secondary | ICD-10-CM | POA: Diagnosis present

## 2020-01-24 DIAGNOSIS — R112 Nausea with vomiting, unspecified: Secondary | ICD-10-CM

## 2020-01-24 DIAGNOSIS — K219 Gastro-esophageal reflux disease without esophagitis: Secondary | ICD-10-CM | POA: Diagnosis present

## 2020-01-24 DIAGNOSIS — E119 Type 2 diabetes mellitus without complications: Secondary | ICD-10-CM | POA: Diagnosis present

## 2020-01-24 DIAGNOSIS — K433 Parastomal hernia with obstruction, without gangrene: Principal | ICD-10-CM | POA: Diagnosis present

## 2020-01-24 DIAGNOSIS — Z800 Family history of malignant neoplasm of digestive organs: Secondary | ICD-10-CM | POA: Diagnosis not present

## 2020-01-24 DIAGNOSIS — K435 Parastomal hernia without obstruction or  gangrene: Secondary | ICD-10-CM | POA: Diagnosis present

## 2020-01-24 DIAGNOSIS — E876 Hypokalemia: Secondary | ICD-10-CM | POA: Diagnosis present

## 2020-01-24 DIAGNOSIS — Z7984 Long term (current) use of oral hypoglycemic drugs: Secondary | ICD-10-CM

## 2020-01-24 DIAGNOSIS — K432 Incisional hernia without obstruction or gangrene: Secondary | ICD-10-CM | POA: Diagnosis present

## 2020-01-24 DIAGNOSIS — J45909 Unspecified asthma, uncomplicated: Secondary | ICD-10-CM | POA: Diagnosis present

## 2020-01-24 DIAGNOSIS — F5101 Primary insomnia: Secondary | ICD-10-CM | POA: Diagnosis present

## 2020-01-24 DIAGNOSIS — G4730 Sleep apnea, unspecified: Secondary | ICD-10-CM | POA: Diagnosis present

## 2020-01-24 DIAGNOSIS — Z833 Family history of diabetes mellitus: Secondary | ICD-10-CM | POA: Diagnosis not present

## 2020-01-24 DIAGNOSIS — K5909 Other constipation: Secondary | ICD-10-CM | POA: Diagnosis not present

## 2020-01-24 DIAGNOSIS — K567 Ileus, unspecified: Secondary | ICD-10-CM | POA: Diagnosis present

## 2020-01-24 DIAGNOSIS — Z0189 Encounter for other specified special examinations: Secondary | ICD-10-CM

## 2020-01-24 DIAGNOSIS — I10 Essential (primary) hypertension: Secondary | ICD-10-CM | POA: Diagnosis present

## 2020-01-24 DIAGNOSIS — K56609 Unspecified intestinal obstruction, unspecified as to partial versus complete obstruction: Secondary | ICD-10-CM | POA: Diagnosis not present

## 2020-01-24 LAB — URINALYSIS, ROUTINE W REFLEX MICROSCOPIC
Bilirubin Urine: NEGATIVE
Glucose, UA: NEGATIVE mg/dL
Hgb urine dipstick: NEGATIVE
Ketones, ur: 40 mg/dL — AB
Nitrite: NEGATIVE
Protein, ur: NEGATIVE mg/dL
Specific Gravity, Urine: 1.02 (ref 1.005–1.030)
pH: 6 (ref 5.0–8.0)

## 2020-01-24 LAB — RESP PANEL BY RT-PCR (FLU A&B, COVID) ARPGX2
Influenza A by PCR: NEGATIVE
Influenza B by PCR: NEGATIVE
SARS Coronavirus 2 by RT PCR: NEGATIVE

## 2020-01-24 LAB — CBC
HCT: 37.3 % (ref 36.0–46.0)
Hemoglobin: 12.1 g/dL (ref 12.0–15.0)
MCH: 27 pg (ref 26.0–34.0)
MCHC: 32.4 g/dL (ref 30.0–36.0)
MCV: 83.3 fL (ref 80.0–100.0)
Platelets: 280 10*3/uL (ref 150–400)
RBC: 4.48 MIL/uL (ref 3.87–5.11)
RDW: 15.4 % (ref 11.5–15.5)
WBC: 7 10*3/uL (ref 4.0–10.5)
nRBC: 0 % (ref 0.0–0.2)

## 2020-01-24 LAB — COMPREHENSIVE METABOLIC PANEL
ALT: 37 U/L (ref 0–44)
AST: 25 U/L (ref 15–41)
Albumin: 4.7 g/dL (ref 3.5–5.0)
Alkaline Phosphatase: 71 U/L (ref 38–126)
Anion gap: 12 (ref 5–15)
BUN: 13 mg/dL (ref 6–20)
CO2: 23 mmol/L (ref 22–32)
Calcium: 9.3 mg/dL (ref 8.9–10.3)
Chloride: 99 mmol/L (ref 98–111)
Creatinine, Ser: 0.67 mg/dL (ref 0.44–1.00)
GFR, Estimated: >60 mL/min (ref >60)
Glucose, Bld: 154 mg/dL — ABNORMAL HIGH (ref 70–99)
Potassium: 4.2 mmol/L (ref 3.5–5.1)
Sodium: 134 mmol/L — ABNORMAL LOW (ref 135–145)
Total Bilirubin: 0.5 mg/dL (ref 0.3–1.2)
Total Protein: 8.8 g/dL — ABNORMAL HIGH (ref 6.5–8.1)

## 2020-01-24 LAB — URINALYSIS, MICROSCOPIC (REFLEX)

## 2020-01-24 LAB — LACTIC ACID, PLASMA
Lactic Acid, Venous: 2.1 mmol/L (ref 0.5–1.9)
Lactic Acid, Venous: 2.7 mmol/L (ref 0.5–1.9)

## 2020-01-24 LAB — PREGNANCY, URINE: Preg Test, Ur: NEGATIVE

## 2020-01-24 LAB — LIPASE, BLOOD: Lipase: 19 U/L (ref 11–51)

## 2020-01-24 MED ORDER — ONDANSETRON 4 MG PO TBDP: 4.0000 mg | ORAL_TABLET | Freq: Once | ORAL | Status: AC | PRN

## 2020-01-24 MED ORDER — DROPERIDOL 2.5 MG/ML IJ SOLN
2.5000 mg | Freq: Once | INTRAMUSCULAR | Status: AC
  Administered 2020-01-24: 2.5 mg via INTRAVENOUS
  Filled 2020-01-24: qty 2

## 2020-01-24 MED ORDER — SODIUM CHLORIDE 0.9 % IV SOLN: Freq: Once | INTRAVENOUS | Status: AC

## 2020-01-24 MED ORDER — SODIUM CHLORIDE 0.9 % IV BOLUS
1000.0000 mL | Freq: Once | INTRAVENOUS | Status: AC
  Administered 2020-01-24: 1000 mL via INTRAVENOUS

## 2020-01-24 MED ORDER — ONDANSETRON 4 MG PO TBDP
ORAL_TABLET | ORAL | Status: AC
  Administered 2020-01-24: 4 mg via ORAL
  Filled 2020-01-24: qty 1

## 2020-01-24 MED ORDER — FENTANYL CITRATE (PF) 100 MCG/2ML IJ SOLN
100.0000 ug | Freq: Once | INTRAMUSCULAR | Status: AC
  Administered 2020-01-24: 100 ug via INTRAVENOUS
  Filled 2020-01-24: qty 2

## 2020-01-24 MED ORDER — IOHEXOL 300 MG/ML  SOLN
100.0000 mL | Freq: Once | INTRAMUSCULAR | Status: AC
  Administered 2020-01-24: 100 mL via INTRAVENOUS

## 2020-01-24 MED ORDER — ONDANSETRON HCL 4 MG/2ML IJ SOLN
INTRAMUSCULAR | Status: AC
  Administered 2020-01-24: 4 mg
  Filled 2020-01-24: qty 2

## 2020-01-24 MED ORDER — DIPHENHYDRAMINE HCL 50 MG/ML IJ SOLN
12.5000 mg | Freq: Once | INTRAMUSCULAR | Status: AC
  Administered 2020-01-24: 12.5 mg via INTRAVENOUS
  Filled 2020-01-24: qty 1

## 2020-01-24 MED ORDER — PROCHLORPERAZINE EDISYLATE 10 MG/2ML IJ SOLN
10.0000 mg | Freq: Once | INTRAMUSCULAR | Status: AC
  Administered 2020-01-24: 10 mg via INTRAVENOUS
  Filled 2020-01-24: qty 2

## 2020-01-24 MED ORDER — FENTANYL CITRATE (PF) 100 MCG/2ML IJ SOLN
50.0000 ug | Freq: Once | INTRAMUSCULAR | Status: AC
  Administered 2020-01-24: 50 ug via INTRAVENOUS
  Filled 2020-01-24: qty 2

## 2020-01-24 NOTE — ED Notes (Signed)
Per [place] MD pt can go to CT scan without waiting for labs at med center r/t labs being WNL at [facility] earlier today.

## 2020-01-24 NOTE — ED Notes (Signed)
Date and time results received: 01/24/20 2036  Test: lactic acid Critical Value: 2.7  Name of Provider Notified: Dr. Ronnald Nian  Orders Received? Or Actions Taken?: no new orders

## 2020-01-24 NOTE — Progress Notes (Signed)
Placed patient on oxygen set at 5lpm due to decrease in Sp02 levels.

## 2020-01-24 NOTE — ED Provider Notes (Signed)
[facility] EMERGENCY DEPARTMENT Provider Note   CSN: [id number] Arrival date & time: 01/24/20  1628     History Chief Complaint  Patient presents with  . Abdominal Pain    Cindy Peterson is a 54 y.o. female.  The history is provided by the patient.  Abdominal Pain Pain location: ostomy site. Pain quality: aching and bloating   Pain radiates to:  Does not radiate Pain severity:  Severe Onset quality:  Gradual Timing:  Constant Progression:  Worsening Chronicity:  New Context: previous surgery (History of colon cancer s/p ostomy with hisotry of parastomal hernia, acute worsening of pain today and inscreasing pain and swelling around ostomy with minimal input)   Relieved by:  Nothing Worsened by:  Nothing Associated symptoms: nausea and vomiting   Associated symptoms: no chest pain, no chills, no cough, no dysuria, no fever, no hematuria, no shortness of breath and no sore throat   Risk factors: multiple surgeries        Past Medical History:  Diagnosis Date  . Asthma   . Colon cancer ([place])   . Hypertension   . PCOS (polycystic ovarian syndrome)     There are no problems to display for this patient.   Past Surgical History:  Procedure Laterality Date  . ABDOMINAL HYSTERECTOMY    . COLON SURGERY    . COLOSTOMY       OB History   No obstetric history on file.     History reviewed. No pertinent family history.  Social History   Tobacco Use  . Smoking status: Never Smoker  . Smokeless tobacco: Never Used  Substance Use Topics  . Alcohol use: Yes    Comment: rarely  . Drug use: No    Home Medications Prior to Admission medications   Medication Sig Start Date End Date Taking? Authorizing Provider  fluticasone (FLOVENT HFA) 44 MCG/ACT inhaler Inhale into the lungs 2 (two) times daily.    [provider]  losartan-hydrochlorothiazide (HYZAAR) 100-12.5 MG tablet Take 1 tablet by mouth daily.    [provider]  metFORMIN  (GLUCOPHAGE) 500 MG tablet Take by mouth 2 (two) times daily with a meal.    [provider]  omeprazole (PRILOSEC) 20 MG capsule Take 20 mg by mouth daily.    [provider]  zolpidem (AMBIEN) 5 MG tablet Take 5 mg by mouth at bedtime as needed for sleep.    [provider]    Allergies    Morphine and related and Latex  Review of Systems   Review of Systems  Constitutional: Negative for chills and fever.  HENT: Negative for ear pain and sore throat.   Eyes: Negative for pain and visual disturbance.  Respiratory: Negative for cough and shortness of breath.   Cardiovascular: Negative for chest pain and palpitations.  Gastrointestinal: Positive for abdominal distention, abdominal pain, nausea and vomiting.  Genitourinary: Negative for dysuria and hematuria.  Musculoskeletal: Negative for arthralgias and back pain.  Skin: Negative for color change and rash.  Neurological: Negative for seizures and syncope.  All other systems reviewed and are negative.   Physical Exam Updated Vital Signs  ED Triage Vitals  Enc Vitals Group     BP 01/24/20 1653 (!) 118/55     Pulse Rate 01/24/20 1653 73     Resp --      Temp 01/24/20 1653 98.3 F (36.8 C)     Temp Source 01/24/20 1653 Oral     SpO2 01/24/20 1653  100 %     Weight 01/24/20 1655 270 lb (122.5 kg)     Height 01/24/20 1655 5\' 3"  (1.6 m)     Head Circumference --      Peak Flow --      Pain Score 01/24/20 1654 10     Pain Loc --      Pain Edu? --      Excl. in [place]? --     Physical Exam Vitals and nursing note reviewed.  Constitutional:      General: She is not in acute distress.    Appearance: She is well-developed.  HENT:     Head: Normocephalic and atraumatic.  Eyes:     Conjunctiva/sclera: Conjunctivae normal.  Cardiovascular:     Rate and Rhythm: Normal rate and regular rhythm.     Heart sounds: No murmur heard.   Pulmonary:     Effort: Pulmonary effort is normal. No respiratory  distress.     Breath sounds: Normal breath sounds.  Abdominal:     Palpations: Abdomen is soft.     Tenderness: There is abdominal tenderness. There is guarding and rebound.     Hernia: A hernia (parastomal hernia that is firm and irreducible, ostomy with no gas, pink bowel protruding, firmness goes up into the LUQ) is present.  Musculoskeletal:     Cervical back: Neck supple.  Skin:    General: Skin is warm and dry.     Capillary Refill: Capillary refill takes less than 2 seconds.  Neurological:     General: No focal deficit present.     Mental Status: She is alert.  Psychiatric:        Mood and Affect: Mood normal.     ED Results / Procedures / Treatments   Labs (all labs ordered are listed, but only abnormal results are displayed) Labs Reviewed  COMPREHENSIVE METABOLIC PANEL - Abnormal; Notable for the following components:      Result Value   Sodium 134 (*)    Glucose, Bld 154 (*)    Total Protein 8.8 (*)    All other components within normal limits  URINALYSIS, ROUTINE W REFLEX MICROSCOPIC - Abnormal; Notable for the following components:   Ketones, ur 40 (*)    Leukocytes,Ua TRACE (*)    All other components within normal limits  LACTIC ACID, PLASMA - Abnormal; Notable for the following components:   Lactic Acid, Venous 2.1 (*)    All other components within normal limits  URINALYSIS, MICROSCOPIC (REFLEX) - Abnormal; Notable for the following components:   Bacteria, UA FEW (*)    All other components within normal limits  RESP PANEL BY RT-PCR (FLU A&B, COVID) ARPGX2  LIPASE, BLOOD  PREGNANCY, URINE  CBC  LACTIC ACID, PLASMA    EKG None  Radiology DG Abdomen 1 View  Result Date: 01/24/2020 CLINICAL DATA:  Status post gastric catheter placed EXAM: ABDOMEN - 1 VIEW COMPARISON:  None. FINDINGS: Gastric catheter is noted within the stomach. Relative paucity of bowel gas is noted within the abdomen. No free air is seen. IMPRESSION: Gastric catheter within the  stomach. Electronically Signed   By: Inez Catalina M.D.   On: 01/24/2020 20:15   CT ABDOMEN PELVIS W CONTRAST  Result Date: 01/24/2020 CLINICAL DATA:  Abdominal pain, nausea and vomiting, history of colon cancer EXAM: CT ABDOMEN AND PELVIS WITH CONTRAST TECHNIQUE: Multidetector CT imaging of the abdomen and pelvis was performed using the standard protocol following bolus administration of intravenous contrast. CONTRAST:  175mL OMNIPAQUE IOHEXOL 300 MG/ML  SOLN COMPARISON:  03/20/2019 FINDINGS: Lower chest: No acute pleural or parenchymal lung disease. Hepatobiliary: No focal liver abnormality is seen. Status post cholecystectomy. No biliary dilatation. Pancreas: Unremarkable. No pancreatic ductal dilatation or surrounding inflammatory changes. Spleen: Normal in size without focal abnormality. Adrenals/Urinary Tract: Adrenal glands are unremarkable. Kidneys are normal, without renal calculi, focal lesion, or hydronephrosis. Bladder is unremarkable. Stomach/Bowel: There is a left lower quadrant colostomy. There is a large parastomal hernia, containing multiple loops of small bowel. The small bowel is dilated measuring up to 5 cm in diameter, consistent with obstruction. There is also an infraumbilical midline ventral hernia containing nondilated loops of small bowel. No bowel wall thickening. Vascular/Lymphatic: No significant vascular findings are present. No enlarged abdominal or pelvic lymph nodes. Reproductive: Status post hysterectomy. No adnexal masses. Other: No free fluid or free gas. Musculoskeletal: No acute or destructive bony lesions. Reconstructed images demonstrate no additional findings. IMPRESSION: 1. Large left lower quadrant parastomal hernia, with small-bowel obstruction involving the herniated small bowel. 2. Midline infraumbilical ventral hernia without evidence of complication. Electronically Signed   By: Randa Ngo M.D.   On: 01/24/2020 19:18    Procedures Procedures (including  critical care time)  Medications Ordered in ED Medications  ondansetron (ZOFRAN-ODT) disintegrating tablet 4 mg (4 mg Oral Given 01/24/20 1710)  sodium chloride 0.9 % bolus 1,000 mL (0 mLs Intravenous Stopped 01/24/20 1954)  prochlorperazine (COMPAZINE) injection 10 mg (10 mg Intravenous Given 01/24/20 1834)  diphenhydrAMINE (BENADRYL) injection 12.5 mg (12.5 mg Intravenous Given 01/24/20 1828)  fentaNYL (SUBLIMAZE) injection 100 mcg (100 mcg Intravenous Given 01/24/20 1829)  iohexol (OMNIPAQUE) 300 MG/ML solution 100 mL (100 mLs Intravenous Contrast Given 01/24/20 1851)  0.9 %  sodium chloride infusion ( Intravenous New Bag/Given 01/24/20 1956)  ondansetron (ZOFRAN) 4 MG/2ML injection (4 mg  Given 01/24/20 1956)    ED Course  I have reviewed the triage vital signs and the nursing notes.  Pertinent labs & imaging results that were available during my care of the patient were reviewed by me and considered in my medical decision making (see chart for details).    MDM Rules/Calculators/A&P                          Avie Checo is a 54 year old female who presents to the ED with abdominal pain.  History of colon cancer status post colectomy.  Has ostomy in place.  Known parastomal hernia and now with worsening pain.  Has firmness and tenderness around the parastomal hernia area and suspect she has a bowel obstruction causing increasing pain.  Has not had much output from stoma today.  CT scan of the abdomen pelvis confirmed large parastomal hernia with small bowel obstruction.  Lactic acid was 2.1.  Otherwise lab work is unremarkable.  NG tube was placed.  I made phone calls to [facility] surgery where she has followed with in the past but hospital has a 24 to 48-hour wait.  Therefore talked with Dr. Redmond Pulling with our general surgery team and are okay with admission to our hospitalist.  Patient was given IV fluids, IV narcotics and improving following NG tube placement.  Will admit to  medicine for further care.  This chart was dictated using voice recognition software.  Despite best efforts to proofread,  errors can occur which can change the documentation meaning.    Final Clinical Impression(s) / ED Diagnoses Final diagnoses:  SBO (  small bowel obstruction) [facility])    Rx / DC Orders ED Discharge Orders    None       Lennice Sites, DO 01/24/20 2033

## 2020-01-24 NOTE — ED Notes (Signed)
Patient is resting comfortably. 

## 2020-01-24 NOTE — ED Notes (Signed)
[facility] arrived at bedside.

## 2020-01-24 NOTE — ED Notes (Signed)
Patient transported to CT 

## 2020-01-24 NOTE — ED Triage Notes (Addendum)
Patient has colostomy and stated that she might have a blockage as per history.  Pain started this morning with nausea and vomiting. Denies fever.  Patient is supposed to take Miralax but has not taken it for a week or so.

## 2020-01-25 ENCOUNTER — Inpatient Hospital Stay (HOSPITAL_COMMUNITY): Payer: BC Managed Care – PPO

## 2020-01-25 ENCOUNTER — Encounter (HOSPITAL_COMMUNITY): Payer: Self-pay | Admitting: Family Medicine

## 2020-01-25 DIAGNOSIS — K433 Parastomal hernia with obstruction, without gangrene: Principal | ICD-10-CM

## 2020-01-25 DIAGNOSIS — I10 Essential (primary) hypertension: Secondary | ICD-10-CM

## 2020-01-25 DIAGNOSIS — K435 Parastomal hernia without obstruction or  gangrene: Secondary | ICD-10-CM | POA: Diagnosis present

## 2020-01-25 DIAGNOSIS — K56609 Unspecified intestinal obstruction, unspecified as to partial versus complete obstruction: Secondary | ICD-10-CM

## 2020-01-25 LAB — GLUCOSE, CAPILLARY
Glucose-Capillary: 125 mg/dL — ABNORMAL HIGH (ref 70–99)
Glucose-Capillary: 137 mg/dL — ABNORMAL HIGH (ref 70–99)
Glucose-Capillary: 104 mg/dL — ABNORMAL HIGH (ref 70–99)
Glucose-Capillary: 105 mg/dL — ABNORMAL HIGH (ref 70–99)
Glucose-Capillary: 117 mg/dL — ABNORMAL HIGH (ref 70–99)

## 2020-01-25 LAB — LACTIC ACID, PLASMA: Lactic Acid, Venous: 1.9 mmol/L (ref 0.5–1.9)

## 2020-01-25 LAB — HIV ANTIBODY (ROUTINE TESTING W REFLEX): HIV Screen 4th Generation wRfx: NONREACTIVE

## 2020-01-25 LAB — BASIC METABOLIC PANEL
Anion gap: 12 (ref 5–15)
BUN: 11 mg/dL (ref 6–20)
CO2: 25 mmol/L (ref 22–32)
Calcium: 9 mg/dL (ref 8.9–10.3)
Chloride: 100 mmol/L (ref 98–111)
Creatinine, Ser: 0.61 mg/dL (ref 0.44–1.00)
GFR, Estimated: >60 mL/min (ref >60)
Glucose, Bld: 145 mg/dL — ABNORMAL HIGH (ref 70–99)
Potassium: 3.8 mmol/L (ref 3.5–5.1)
Sodium: 137 mmol/L (ref 135–145)

## 2020-01-25 LAB — HEMOGLOBIN A1C
Hgb A1c MFr Bld: 6.2 % — ABNORMAL HIGH (ref 4.8–5.6)
Mean Plasma Glucose: 131.24 mg/dL

## 2020-01-25 MED ORDER — ACETAMINOPHEN 650 MG RE SUPP: 650.0000 mg | Freq: Four times a day (QID) | RECTAL | Status: DC | PRN

## 2020-01-25 MED ORDER — ENOXAPARIN SODIUM 40 MG/0.4ML ~~LOC~~ SOLN
40.0000 mg | SUBCUTANEOUS | Status: DC
  Administered 2020-01-25 – 2020-01-31 (×7): 40 mg via SUBCUTANEOUS
  Filled 2020-01-25 (×7): qty 0.4

## 2020-01-25 MED ORDER — DIATRIZOATE MEGLUMINE & SODIUM 66-10 % PO SOLN
90.0000 mL | Freq: Once | ORAL | Status: AC
  Administered 2020-01-25: 90 mL via NASOGASTRIC
  Filled 2020-01-25: qty 90

## 2020-01-25 MED ORDER — HYDROMORPHONE HCL 1 MG/ML IJ SOLN
0.5000 mg | INTRAMUSCULAR | Status: DC | PRN
  Administered 2020-01-25 – 2020-02-04 (×53): 1 mg via INTRAVENOUS
  Filled 2020-01-25 (×53): qty 1

## 2020-01-25 MED ORDER — ACETAMINOPHEN 325 MG PO TABS
650.0000 mg | ORAL_TABLET | Freq: Four times a day (QID) | ORAL | Status: DC | PRN
  Administered 2020-01-27: 650 mg via ORAL
  Filled 2020-01-25: qty 2

## 2020-01-25 MED ORDER — LACTATED RINGERS IV SOLN: INTRAVENOUS | Status: DC

## 2020-01-25 MED ORDER — ONDANSETRON HCL 4 MG PO TABS
4.0000 mg | ORAL_TABLET | Freq: Four times a day (QID) | ORAL | Status: DC | PRN
  Filled 2020-01-25: qty 1

## 2020-01-25 MED ORDER — INSULIN ASPART 100 UNIT/ML ~~LOC~~ SOLN: 0.0000 [IU] | SUBCUTANEOUS | Status: DC

## 2020-01-25 MED ORDER — ONDANSETRON HCL 4 MG/2ML IJ SOLN
4.0000 mg | Freq: Four times a day (QID) | INTRAMUSCULAR | Status: DC | PRN
  Administered 2020-01-25 – 2020-02-07 (×24): 4 mg via INTRAVENOUS
  Filled 2020-01-25 (×25): qty 2

## 2020-01-25 NOTE — Progress Notes (Signed)
Patient ID: Cindy Peterson, female   DOB: 10-Feb-1966, 54 y.o.   MRN: [id number] [facility] Surgery Progress Note:   * No surgery found *  Subjective: Mental status is clear;  Patient seen and examined.  Complaints pain is better. . Objective: Vital signs in last 24 hours: Temp:  [98 F (36.7 C)-98.6 F (37 C)] 98.6 F (37 C) (11/30 0539) Pulse Rate:  [61-109] 98 (11/30 0539) Resp:  [16-18] 18 (11/30 0539) BP: (118-157)/(55-110) 130/65 (11/30 0539) SpO2:  [80 %-100 %] 100 % (11/30 0539) Weight:  [122.5 kg] 122.5 kg (11/29 1655)  Intake/Output from previous day: 11/29 0701 - 11/30 0700 In: 1000.4 [I.V.:1.4; IV Piggyback:999] Out: 1 [Urine:1] Intake/Output this shift: Total I/O In: 81.1 [I.V.:81.1] Out: 450 [Urine:450]  Physical Exam: Work of breathing is normal.  LLQ ostomy in this large lady (APR per Darrol Jump at [facility]).  Some ostomy in bag.    Lab Results:  Results for orders placed or performed during the hospital encounter of 01/24/20 (from the past 48 hour(s))  Lipase, blood     Status: None   Collection Time: 01/24/20  6:10 PM  Result Value Ref Range   Lipase 19 11 - 51 U/L    Comment: Performed at [facility], [address] [place], [place] [address]  Comprehensive metabolic panel     Status: Abnormal   Collection Time: 01/24/20  6:10 PM  Result Value Ref Range   Sodium 134 (L) 135 - 145 mmol/L   Potassium 4.2 3.5 - 5.1 mmol/L   Chloride 99 98 - 111 mmol/L   CO2 23 22 - 32 mmol/L   Glucose, Bld 154 (H) 70 - 99 mg/dL    Comment: Glucose reference range applies only to samples taken after fasting for at least 8 hours.   BUN 13 6 - 20 mg/dL   Creatinine, Ser 0.67 0.44 - 1.00 mg/dL   Calcium 9.3 8.9 - 10.3 mg/dL   Total Protein 8.8 (H) 6.5 - 8.1 g/dL   Albumin 4.7 3.5 - 5.0 g/dL   AST 25 15 - 41 U/L   ALT 37 0 - 44 U/L   Alkaline Phosphatase 71 38 - 126 U/L   Total Bilirubin 0.5 0.3 - 1.2 mg/dL   GFR, Estimated >60 >60 mL/min    Comment:  (NOTE) Calculated using the CKD-EPI Creatinine Equation (2021)    Anion gap 12 5 - 15    Comment: Performed at [facility], [address] [place], [place] [address]  Lactic acid, plasma     Status: Abnormal   Collection Time: 01/24/20  6:10 PM  Result Value Ref Range   Lactic Acid, Venous 2.1 (HH) 0.5 - 1.9 mmol/L    Comment: CRITICAL RESULT CALLED TO, READ BACK BY AND VERIFIED WITH: JAMIE BAILEY,RN AT 01/24/20 1842 BY K BARR Performed at [facility], [address] [place], [place] [address]   CBC     Status: None   Collection Time: 01/24/20  6:45 PM  Result Value Ref Range   WBC 7.0 4.0 - 10.5 K/uL   RBC 4.48 3.87 - 5.11 MIL/uL   Hemoglobin 12.1 12.0 - 15.0 g/dL   HCT 37.3 36 - 46 %   MCV 83.3 80.0 - 100.0 fL   MCH 27.0 26.0 - 34.0 pg   MCHC 32.4 30.0 - 36.0 g/dL   RDW 15.4 11.5 - 15.5 %   Platelets 280 150 - 400 K/uL  nRBC 0.0 0.0 - 0.2 %    Comment: Performed at [facility], [address] [place], [place] [address]  Resp Panel by RT-PCR (Flu A&B, Covid) Nasopharyngeal Swab     Status: None   Collection Time: 01/24/20  7:24 PM   Specimen: Nasopharyngeal Swab; Nasopharyngeal(NP) swabs in vial transport medium  Result Value Ref Range   SARS Coronavirus 2 by RT PCR NEGATIVE NEGATIVE    Comment: (NOTE) SARS-CoV-2 target nucleic acids are NOT DETECTED.  The SARS-CoV-2 RNA is generally detectable in upper respiratory specimens during the acute phase of infection. The lowest concentration of SARS-CoV-2 viral copies this assay can detect is 138 copies/mL. A negative result does not preclude SARS-Cov-2 infection and should not be used as the sole basis for treatment or other patient management decisions. A negative result may occur with  improper specimen collection/handling, submission of specimen other than nasopharyngeal swab, presence of viral mutation(s) within the areas targeted by this assay, and inadequate number of  viral copies(<138 copies/mL). A negative result must be combined with clinical observations, patient history, and epidemiological information. The expected result is Negative.  Fact Sheet for Patients:  EntrepreneurPulse.com.au  Fact Sheet for Healthcare Providers:  IncredibleEmployment.be  This test is no t yet approved or cleared by the [place] FDA and  has been authorized for detection and/or diagnosis of SARS-CoV-2 by FDA under an Emergency Use Authorization (EUA). This EUA will remain  in effect (meaning this test can be used) for the duration of the COVID-19 declaration under Section 564(b)(1) of the Act, 21 U.S.C.section 360bbb-3(b)(1), unless the authorization is terminated  or revoked sooner.       Influenza A by PCR NEGATIVE NEGATIVE   Influenza B by PCR NEGATIVE NEGATIVE    Comment: (NOTE) The Xpert Xpress SARS-CoV-2/FLU/RSV plus assay is intended as an aid in the diagnosis of influenza from Nasopharyngeal swab specimens and should not be used as a sole basis for treatment. Nasal washings and aspirates are unacceptable for Xpert Xpress SARS-CoV-2/FLU/RSV testing.  Fact Sheet for Patients: EntrepreneurPulse.com.au  Fact Sheet for Healthcare Providers: IncredibleEmployment.be  This test is not yet approved or cleared by the [place] FDA and has been authorized for detection and/or diagnosis of SARS-CoV-2 by FDA under an Emergency Use Authorization (EUA). This EUA will remain in effect (meaning this test can be used) for the duration of the COVID-19 declaration under Section 564(b)(1) of the Act, 21 U.S.C. section 360bbb-3(b)(1), unless the authorization is terminated or revoked.  Performed at [facility], [address] [place], [place] [address]   Lactic acid, plasma     Status: Abnormal   Collection Time: 01/24/20  7:57 PM  Result Value Ref Range   Lactic Acid,  Venous 2.7 (HH) 0.5 - 1.9 mmol/L    Comment: CRITICAL RESULT CALLED TO, READ BACK BY AND VERIFIED WITH: Marsa Aris RN AT 2035 ON 01/24/20 BY [email or web address] Performed at [facility], [address] [place], [place] [address]   Urinalysis, Routine w reflex microscopic     Status: Abnormal   Collection Time: 01/24/20  7:58 PM  Result Value Ref Range   Color, Urine YELLOW YELLOW   APPearance CLEAR CLEAR   Specific Gravity, Urine 1.020 1.005 - 1.030   pH 6.0 5.0 - 8.0   Glucose, UA NEGATIVE NEGATIVE mg/dL   Hgb urine dipstick NEGATIVE NEGATIVE   Bilirubin Urine NEGATIVE NEGATIVE   Ketones, ur 40 (A) NEGATIVE mg/dL  Protein, ur NEGATIVE NEGATIVE mg/dL   Nitrite NEGATIVE NEGATIVE   Leukocytes,Ua TRACE (A) NEGATIVE    Comment: Performed at [facility], [address] [place], [place] [address]  Pregnancy, urine     Status: None   Collection Time: 01/24/20  7:58 PM  Result Value Ref Range   Preg Test, Ur NEGATIVE NEGATIVE    Comment:        THE SENSITIVITY OF THIS METHODOLOGY IS >20 mIU/mL. Performed at [facility], [address] [place], [place] [address]   Urinalysis, Microscopic (reflex)     Status: Abnormal   Collection Time: 01/24/20  7:58 PM  Result Value Ref Range   RBC / HPF 0-5 0 - 5 RBC/hpf   WBC, UA 0-5 0 - 5 WBC/hpf   Bacteria, UA FEW (A) NONE SEEN   Squamous Epithelial / LPF 0-5 0 - 5    Comment: Performed at [facility], [address] [place], [place] [address]  Glucose, capillary     Status: Abnormal   Collection Time: 01/25/20  4:01 AM  Result Value Ref Range   Glucose-Capillary 125 (H) 70 - 99 mg/dL    Comment: Glucose reference range applies only to samples taken after fasting for at least 8 hours.  Basic metabolic panel     Status: Abnormal   Collection Time: 01/25/20  6:12 AM  Result Value Ref Range   Sodium 137 135 - 145 mmol/L   Potassium 3.8 3.5 - 5.1 mmol/L   Chloride 100 98 - 111 mmol/L   CO2 25 22 - 32  mmol/L   Glucose, Bld 145 (H) 70 - 99 mg/dL    Comment: Glucose reference range applies only to samples taken after fasting for at least 8 hours.   BUN 11 6 - 20 mg/dL   Creatinine, Ser 0.61 0.44 - 1.00 mg/dL   Calcium 9.0 8.9 - 10.3 mg/dL   GFR, Estimated >60 >60 mL/min    Comment: (NOTE) Calculated using the CKD-EPI Creatinine Equation (2021)    Anion gap 12 5 - 15    Comment: Performed at [facility], [place] [address], [place], [place] [address]  Hemoglobin A1c     Status: Abnormal   Collection Time: 01/25/20  6:12 AM  Result Value Ref Range   Hgb A1c MFr Bld 6.2 (H) 4.8 - 5.6 %    Comment: (NOTE) Pre diabetes:          5.7%-6.4%  Diabetes:              >6.4%  Glycemic control for   <7.0% adults with diabetes    Mean Plasma Glucose 131.24 mg/dL    Comment: Performed at [facility] [address] [place], [place] [address]  Glucose, capillary     Status: Abnormal   Collection Time: 01/25/20  7:44 AM  Result Value Ref Range   Glucose-Capillary 137 (H) 70 - 99 mg/dL    Comment: Glucose reference range applies only to samples taken after fasting for at least 8 hours.    Radiology/Results: DG Abdomen 1 View  Result Date: 01/24/2020 CLINICAL DATA:  Status post gastric catheter placed EXAM: ABDOMEN - 1 VIEW COMPARISON:  None. FINDINGS: Gastric catheter is noted within the stomach. Relative paucity of bowel gas is noted within the abdomen. No free air is seen. IMPRESSION: Gastric catheter within the stomach. Electronically Signed   By: Linus Mako.D.  On: 01/24/2020 20:15   CT ABDOMEN PELVIS W CONTRAST  Result Date: 01/24/2020 CLINICAL DATA:  Abdominal pain, nausea and vomiting, history of colon cancer EXAM: CT ABDOMEN AND PELVIS WITH CONTRAST TECHNIQUE: Multidetector CT imaging of the abdomen and pelvis was performed using the standard protocol following bolus administration of intravenous contrast. CONTRAST:  167mL OMNIPAQUE IOHEXOL 300 MG/ML   SOLN COMPARISON:  03/20/2019 FINDINGS: Lower chest: No acute pleural or parenchymal lung disease. Hepatobiliary: No focal liver abnormality is seen. Status post cholecystectomy. No biliary dilatation. Pancreas: Unremarkable. No pancreatic ductal dilatation or surrounding inflammatory changes. Spleen: Normal in size without focal abnormality. Adrenals/Urinary Tract: Adrenal glands are unremarkable. Kidneys are normal, without renal calculi, focal lesion, or hydronephrosis. Bladder is unremarkable. Stomach/Bowel: There is a left lower quadrant colostomy. There is a large parastomal hernia, containing multiple loops of small bowel. The small bowel is dilated measuring up to 5 cm in diameter, consistent with obstruction. There is also an infraumbilical midline ventral hernia containing nondilated loops of small bowel. No bowel wall thickening. Vascular/Lymphatic: No significant vascular findings are present. No enlarged abdominal or pelvic lymph nodes. Reproductive: Status post hysterectomy. No adnexal masses. Other: No free fluid or free gas. Musculoskeletal: No acute or destructive bony lesions. Reconstructed images demonstrate no additional findings. IMPRESSION: 1. Large left lower quadrant parastomal hernia, with small-bowel obstruction involving the herniated small bowel. 2. Midline infraumbilical ventral hernia without evidence of complication. Electronically Signed   By: Randa Ngo M.D.   On: 01/24/2020 19:18    Anti-infectives: Anti-infectives (From admission, onward)   None      Assessment/Plan: Problem List: Patient Active Problem List   Diagnosis Date Noted  . Parastomal hernia 01/25/2020  . HTN (hypertension) 01/25/2020  . Small bowel obstruction ([place]) 01/24/2020    She need ostomy irrigation.  Patient ran out of Miralax and now with constipation.  May need ostomy repair and will refer back to Dr. Gillie Manners.   * No surgery found *    LOS: 1 day   Matt B. Hassell Done, MD, [facility] [phone] to reach the surgeon on call.    01/25/2020 9:43 AM

## 2020-01-25 NOTE — H&P (Addendum)
History and Physical    Cindy Peterson IWP:[id number] DOB: 11-10-1965 DOA: 01/24/2020  PCP: Karlene Einstein, MD  Patient coming from: Home  I have personally briefly reviewed patient's old medical records in [place]  Chief Complaint: Abd pain  HPI: Cindy Peterson is a 54 y.o. female with medical history significant of colon cancer s/p colectomy and ostomy creation, parastomal hernia with chronic incarceration, prior SBO from hernia.  Pt presents to ED at [facility] with c/o severe abd pain.  Located at ostomy site with radiation to back.  Pain is severe, onset was gradual and pain is worsening.  Pain onset earlier today.  Pt with poor ostomy out put.  Supposed to take miralax but hasnt taken in a week.  No fever, has had N/V.   ED Course: CT reveals incarcerated parastomal hernia with SBO.  No strangulation.  NGT placed and pt transferred to [facility] for admit.   Review of Systems: As per HPI, otherwise all review of systems negative.  Past Medical History:  Diagnosis Date  . Asthma   . Colon cancer ([place])   . Hypertension   . PCOS (polycystic ovarian syndrome)     Past Surgical History:  Procedure Laterality Date  . ABDOMINAL HYSTERECTOMY    . COLON SURGERY    . COLOSTOMY       reports that she has never smoked. She has never used smokeless tobacco. She reports current alcohol use. She reports that she does not use drugs.  Allergies  Allergen Reactions  . Morphine And Related Nausea And Vomiting  . Latex     Family History  Problem Relation Age of Onset  . Diabetes Mother   . Hypertension Mother   . Colon cancer Father      Prior to Admission medications   Medication Sig Start Date End Date Taking? Authorizing Provider  fluticasone (FLOVENT HFA) 44 MCG/ACT inhaler Inhale into the lungs 2 (two) times daily.    [provider]  losartan-hydrochlorothiazide (HYZAAR) 100-12.5 MG tablet Take 1 tablet by mouth daily.    [provider]  metFORMIN  (GLUCOPHAGE) 500 MG tablet Take by mouth 2 (two) times daily with a meal.    [provider]  omeprazole (PRILOSEC) 20 MG capsule Take 20 mg by mouth daily.    [provider]  zolpidem (AMBIEN) 5 MG tablet Take 5 mg by mouth at bedtime as needed for sleep.    [provider]    Physical Exam: Vitals:   01/24/20 2105 01/24/20 2200 01/24/20 2300 01/24/20 2356  BP:  (!) 149/81 (!) 153/110 138/80  Pulse: (!) 102 (!) 105 (!) 102 61  Resp:  16 16 18   Temp:    98.2 F (36.8 C)  TempSrc:    Oral  SpO2: 100% 100% 100% 100%  Weight:      Height:        Constitutional: NAD, calm, comfortable Eyes: PERRL, lids and conjunctivae normal ENMT: Mucous membranes are moist. Posterior pharynx clear of any exudate or lesions.Normal dentition.  Neck: normal, supple, no masses, no thyromegaly Respiratory: clear to auscultation bilaterally, no wheezing, no crackles. Normal respiratory effort. No accessory muscle use.  Cardiovascular: Regular rate and rhythm, no murmurs / rubs / gallops. No extremity edema. 2+ pedal pulses. No carotid bruits.  Abdomen: Rock hard parastomal hernia, not reducible. Musculoskeletal: no clubbing / cyanosis. No joint deformity upper and lower extremities. Good ROM, no contractures. Normal muscle tone.  Skin: no rashes, lesions, ulcers. No  induration Neurologic: CN 2-12 grossly intact. Sensation intact, DTR normal. Strength 5/5 in all 4.  Psychiatric: Normal judgment and insight. Alert and oriented x 3. Normal mood.    Labs on Admission: I have personally reviewed following labs and imaging studies  CBC: Recent Labs  Lab 01/24/20 1845  WBC 7.0  HGB 12.1  HCT 37.3  MCV 83.3  PLT 397   Basic Metabolic Panel: Recent Labs  Lab 01/24/20 1810  NA 134*  K 4.2  CL 99  CO2 23  GLUCOSE 154*  BUN 13  CREATININE 0.67  CALCIUM 9.3   GFR: Estimated Creatinine Clearance: 104.4 mL/min (by C-G formula based on SCr of 0.67 mg/dL). Liver Function  Tests: Recent Labs  Lab 01/24/20 1810  AST 25  ALT 37  ALKPHOS 71  BILITOT 0.5  PROT 8.8*  ALBUMIN 4.7   Recent Labs  Lab 01/24/20 1810  LIPASE 19   No results for input(s): AMMONIA in the last 168 hours. Coagulation Profile: No results for input(s): INR, PROTIME in the last 168 hours. Cardiac Enzymes: No results for input(s): CKTOTAL, CKMB, CKMBINDEX, TROPONINI in the last 168 hours. BNP (last 3 results) No results for input(s): PROBNP in the last 8760 hours. HbA1C: No results for input(s): HGBA1C in the last 72 hours. CBG: No results for input(s): GLUCAP in the last 168 hours. Lipid Profile: No results for input(s): CHOL, HDL, LDLCALC, TRIG, CHOLHDL, LDLDIRECT in the last 72 hours. Thyroid Function Tests: No results for input(s): TSH, T4TOTAL, FREET4, T3FREE, THYROIDAB in the last 72 hours. Anemia Panel: No results for input(s): VITAMINB12, FOLATE, FERRITIN, TIBC, IRON, RETICCTPCT in the last 72 hours. Urine analysis:    Component Value Date/Time   COLORURINE YELLOW 01/24/2020 1958   APPEARANCEUR CLEAR 01/24/2020 1958   LABSPEC 1.020 01/24/2020 [place] 6.0 01/24/2020 [place] NEGATIVE 01/24/2020 [place] NEGATIVE 01/24/2020 [place] NEGATIVE 01/24/2020 1958   KETONESUR 40 (A) 01/24/2020 1958   PROTEINUR NEGATIVE 01/24/2020 1958   NITRITE NEGATIVE 01/24/2020 1958   LEUKOCYTESUR TRACE (A) 01/24/2020 1958    Radiological Exams on Admission: DG Abdomen 1 View  Result Date: 01/24/2020 CLINICAL DATA:  Status post gastric catheter placed EXAM: ABDOMEN - 1 VIEW COMPARISON:  None. FINDINGS: Gastric catheter is noted within the stomach. Relative paucity of bowel gas is noted within the abdomen. No free air is seen. IMPRESSION: Gastric catheter within the stomach. Electronically Signed   By: Inez Catalina M.D.   On: 01/24/2020 20:15   CT ABDOMEN PELVIS W CONTRAST  Result Date: 01/24/2020 CLINICAL DATA:  Abdominal pain, nausea and vomiting,  history of colon cancer EXAM: CT ABDOMEN AND PELVIS WITH CONTRAST TECHNIQUE: Multidetector CT imaging of the abdomen and pelvis was performed using the standard protocol following bolus administration of intravenous contrast. CONTRAST:  155mL OMNIPAQUE IOHEXOL 300 MG/ML  SOLN COMPARISON:  03/20/2019 FINDINGS: Lower chest: No acute pleural or parenchymal lung disease. Hepatobiliary: No focal liver abnormality is seen. Status post cholecystectomy. No biliary dilatation. Pancreas: Unremarkable. No pancreatic ductal dilatation or surrounding inflammatory changes. Spleen: Normal in size without focal abnormality. Adrenals/Urinary Tract: Adrenal glands are unremarkable. Kidneys are normal, without renal calculi, focal lesion, or hydronephrosis. Bladder is unremarkable. Stomach/Bowel: There is a left lower quadrant colostomy. There is a large parastomal hernia, containing multiple loops of small bowel. The small bowel is dilated measuring up to 5 cm in diameter, consistent with obstruction. There is also an infraumbilical midline ventral hernia  containing nondilated loops of small bowel. No bowel wall thickening. Vascular/Lymphatic: No significant vascular findings are present. No enlarged abdominal or pelvic lymph nodes. Reproductive: Status post hysterectomy. No adnexal masses. Other: No free fluid or free gas. Musculoskeletal: No acute or destructive bony lesions. Reconstructed images demonstrate no additional findings. IMPRESSION: 1. Large left lower quadrant parastomal hernia, with small-bowel obstruction involving the herniated small bowel. 2. Midline infraumbilical ventral hernia without evidence of complication. Electronically Signed   By: Randa Ngo M.D.   On: 01/24/2020 19:18    EKG: Independently reviewed.  Assessment/Plan Principal Problem:   Small bowel obstruction (HCC) Active Problems:   Parastomal hernia   HTN (hypertension)    1. SBO - 1. NPO 2. IVF: LR at 125 3. NGT in  place 4. Dilaudid PRN pain control 1. Cont pulse ox 5. Spoke with Dr. Redmond Pulling 1. Nothing emergent surgical to do 2. Parastomal hernia is chronically incarcerated, not surprised at the hard feel.  No strangulation as this would have been seen on CT scan he states. 3. Surgery will see in AM 2. HTN - 1. Add PRN if needed, 140/73 currently 3. DM2 - 1. Hold metformin 2. Sensitive SSI Q4H  DVT prophylaxis: Lovenox Code Status: Full Family Communication: No family in room Disposition Plan: Home after SBO resolved and taking POs again Consults called: Dr. Redmond Pulling Admission status: Admit to inpatient  Severity of Illness: The appropriate patient status for this patient is INPATIENT. Inpatient status is judged to be reasonable and necessary in order to provide the required intensity of service to ensure the patient's safety. The patient's presenting symptoms, physical exam findings, and initial radiographic and laboratory data in the context of their chronic comorbidities is felt to place them at high risk for further clinical deterioration. Furthermore, it is not anticipated that the patient will be medically stable for discharge from the hospital within 2 midnights of admission. The following factors support the patient status of inpatient.   IP status due to SBO treated with NGT.  * I certify that at the point of admission it is my clinical judgment that the patient will require inpatient hospital care spanning beyond 2 midnights from the point of admission due to high intensity of service, high risk for further deterioration and high frequency of surveillance required.*    Berit Raczkowski M. DO [facility]  How to contact the [facility] Attending or Consulting provider [place] or covering provider during after hours [place], for this patient?  1. Check the care team in [facility] and look for a) attending/consulting [facility] provider listed and b) the [facility] team listed 2. Log into [email or web address]  Amion  Physician Scheduling and messaging for groups and whole hospitals  On call and physician scheduling software for group practices, residents, hospitalists and other medical providers for call, clinic, rotation and shift schedules. On[employer] is a hospital-wide system for scheduling doctors and paging doctors on call. EasyPlot is for scientific plotting and data analysis.  [email or web address]  and use [place]'s universal password to access. If you do not have the password, please contact the hospital operator.  3. Locate the [facility] provider you are looking for under [facility] and page to a number that you can be directly reached. 4. If you still have difficulty reaching the provider, please page the [facility] (Director on Call) for the Hospitalists listed on amion for assistance.  01/25/2020, 1:44 AM

## 2020-01-25 NOTE — Progress Notes (Signed)
Patient seen and examined.  Admitted early morning hours by nighttime hospitalist.  54 year old with history of colon cancer and prior colectomy with colostomy.  She also has parastomal hernia with chronic incarceration.  Presented to the ER with abdominal pain nausea and vomiting.  Poor output from ostomy.  Probable small bowel obstruction or constipation.  Seen by surgery.  Already improving.  Conservative management and follow-up with her regular surgeon for parastomal hernia anticipated.  We will continue to monitor.

## 2020-01-25 NOTE — Progress Notes (Signed)
Gastrografin given at 9200 called x-ray and spoke with Amy about re-scan in 8 hours.

## 2020-01-25 NOTE — Consult Note (Signed)
[place] [facility] Nurse ostomy consult note Stoma type/location: LLQ colostomy. Asked to assist with colostomy irrigation. Stomal assessment/size: 2 and 1/4 inch round, slightly prolapsed, pink, dry Peristomal assessment: intact Treatment options for stomal/peristomal skin: skin carrier ring, changed from a 1-piece pouching system to a 2 and 3/4 inch pouching system for colostomy irrigation Output small smear of brown stool and flatus in pouch prior to procedure Ostomy pouching: 2pc. 2 and 3/4 inch pouching system with skin barrier ring Education provided:  Patient is provided information about procedure to instill 575mls of SSE with warm tap water via a colostomy irrigation set.537mls instilled with some difficulty, stoma points downward to 5 o'clock. There is a mild parastomal hernia present. No stool felt following digital exam or on tip of cone. Following procedure, there is approximately 250 mls of tea colored water in irrigation sleeve.  No stool flecks. Bedside RN instructed to leave irrigation sleeve in place for 60 minutes and to remove, empty and record output post procedure.  She will thn attach the pouch that corresponds to the 2 and 3/4 inch skin barrier. Patient tolerated procedure well.  Enrolled patient in [place] program: No. Patient is established with a supplier  [place] nursing team will not follow, but will remain available to this patient, the nursing and medical teams.  Please re-consult if needed. Thanks, Maudie Flakes, MSN, RN, [place], Arther Abbott  Pager# [phone]

## 2020-01-25 NOTE — Progress Notes (Signed)
Patient coughed NGT out, Randol Kern was paged.

## 2020-01-25 NOTE — Consult Note (Addendum)
[facility] Surgery Consult Note  Cindy Peterson Mar 05, 1965  [id number].    Requesting MD: Jennette Kettle Chief Complaint: abdominal pain with nausea and vomiting Reason for Consult: SBO  HPI: Patient is a 54 year old female with a history of colon cancer and prior colectomy with colostomy.  She has a history of parastomal hernia with chronic incarceration.  She presented to the ED with abdominal pain, nausea and vomiting.  She reports poor output from her ostomy.  She also reported she was supposed to be taking MiraLAX but has not taken it in a week.  Work-up in the ED shows she is afebrile.  Blood pressure is mildly elevated at times.  She also has some mild tachycardia intermittently.  Sats are 100% on 5 L nasal cannula.  Admission labs shows a sodium of 134, glucose of 154, remainder of the CMP is normal.  Lactate on admission was 2.1, repeat 2.7.  WBC 7.0, hemoglobin 12.1, hematocrit 37.3, platelets 280,000.CT scan shows a large left lower quadrant parastomal hernia with SBO involving the herniated small bowel, midline infraumbilical ventral hernia without complication.    She has a hs of rectal Cancer with abdominal perineal resection 2017, Dr. Morton Stall . She was hospitalized at [facility] 1/23-1/28/2021, with a small bowel obstruction secondary to constipation.  She has managed conservatively with an NG tube.  Her SBO was thought to be secondary to constipation so she was treated with laxatives her diet was advanced and she was discharged.  She was seen 03/30/2019, Dr. Samuel Germany for consultation and treatment of her parastomal hernia.  It was their opinion her SBO in January was due to constipation and not a hernia.  They recommended conservative management because of her weight.  Currently she has had some gas and loose stool from her ostomy, NG with minimal drainage, and she is feeling better.   ROS: Review of Systems  Constitutional: Negative.   HENT: Negative.    Eyes: Negative.   Respiratory:       She was told she has sleep apnea, last hospital admit, but has not followed up as an Op for this.  Cardiovascular: Negative.   Gastrointestinal: Positive for abdominal pain, constipation, nausea and vomiting.  Genitourinary: Negative.   Musculoskeletal: Negative.   Skin: Negative.   Neurological: Negative.   Endo/Heme/Allergies: Negative.   Psychiatric/Behavioral: Negative.     Family History  Problem Relation Age of Onset  . Diabetes Mother   . Hypertension Mother   . Colon cancer Father     Past Medical History:  Diagnosis Date  . Asthma   . Colon cancer ([place])   . Hypertension   . PCOS (polycystic ovarian syndrome)     Past Surgical History:  Procedure Laterality Date  . ABDOMINAL HYSTERECTOMY    . COLON SURGERY    . COLOSTOMY      Social History:  reports that she has never smoked. She has never used smokeless tobacco. She reports current alcohol use. She reports that she does not use drugs.  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 Morphine And Related Nausea And Vomiting  . Latex   . Tape Rash    Medications Prior to Admission  Medication Sig Dispense Refill  . albuterol (VENTOLIN HFA) 108 (90 Base) MCG/ACT inhaler INHALE 2 PUFFS INTO THE LUNGS EVERY 4 HOURS AS NEEDED FOR WHEEZING OR SHORTNESS OF BREATH OR  COUGH    . candesartan-hydrochlorothiazide (ATACAND HCT) 32-12.5 MG tablet Take 1 tablet by mouth daily.    . Fluticasone Propionate, Inhal, (FLOVENT DISKUS) 250 MCG/BLIST AEPB INHALE 1 PUFF INTO THE LUNGS TWICE DAILY    . metFORMIN (GLUCOPHAGE) 500 MG tablet Take by mouth 2 (two) times daily with a meal.    . montelukast (SINGULAIR) 10 MG tablet TAKE 1 TABLET(10 MG) BY MOUTH EVERY NIGHT    . zolpidem (AMBIEN) 5 MG tablet Take 5 mg by mouth at bedtime as needed for sleep.      Blood pressure 130/65, pulse 98, temperature 98.6  F (37 C), temperature source Oral, resp. rate 18, height 5\' 3"  (1.6 m), weight 122.5 kg, SpO2 100 %. Physical Exam:  General: pleasant, WD, obese AA female, who is laying in bed in NAD HEENT: head is normocephalic, atraumatic.  Sclera are noninjected.  Pupils are equal.  Ears and nose without any masses or lesions.  Mouth is pink and moist Heart: regular, rate, and rhythm.  Normal s1,s2. No obvious murmurs, gallops, or rubs noted.  Palpable radial and pedal pulses bilaterally Lungs: CTAB, no wheezes, rhonchi, or rales noted.  Respiratory effort nonlabored Abd: large abdomen with left sided colostomy, large parastomal hernia, not tender this AM.  +BS, some flatus and liquid stool in the ostomy bag this AM. no masses, or organomegaly MS: all 4 extremities are symmetrical with no cyanosis, clubbing, or edema. Skin: warm and dry with no masses, lesions, or rashes Neuro: Cranial nerves 2-12 grossly intact, sensation is normal throughout Psych: A&Ox3 with an appropriate affect.   Results for orders placed or performed during the hospital encounter of 01/24/20 (from the past 48 hour(s))  Lipase, blood     Status: None   Collection Time: 01/24/20  6:10 PM  Result Value Ref Range   Lipase 19 11 - 51 U/L    Comment: Performed at [facility], [address] [place], [place] [address]  Comprehensive metabolic panel     Status: Abnormal   Collection Time: 01/24/20  6:10 PM  Result Value Ref Range   Sodium 134 (L) 135 - 145 mmol/L   Potassium 4.2 3.5 - 5.1 mmol/L   Chloride 99 98 - 111 mmol/L   CO2 23 22 - 32 mmol/L   Glucose, Bld 154 (H) 70 - 99 mg/dL    Comment: Glucose reference range applies only to samples taken after fasting for at least 8 hours.   BUN 13 6 - 20 mg/dL   Creatinine, Ser 0.67 0.44 - 1.00 mg/dL   Calcium 9.3 8.9 - 10.3 mg/dL   Total Protein 8.8 (H) 6.5 - 8.1 g/dL   Albumin 4.7 3.5 - 5.0 g/dL   AST 25 15 - 41 U/L   ALT 37 0 - 44 U/L   Alkaline Phosphatase 71 38 -  126 U/L   Total Bilirubin 0.5 0.3 - 1.2 mg/dL   GFR, Estimated >60 >60 mL/min    Comment: (NOTE) Calculated using the CKD-EPI Creatinine Equation (2021)    Anion gap 12 5 - 15    Comment: Performed at [facility], [address] [place], [place] [address]  Lactic acid, plasma     Status: Abnormal   Collection Time: 01/24/20  6:10 PM  Result Value Ref Range   Lactic Acid, Venous 2.1 (HH) 0.5 - 1.9 mmol/L    Comment: CRITICAL RESULT CALLED TO, READ BACK BY AND VERIFIED WITH: JAMIE BAILEY,RN AT  01/24/20 1842 BY [place] Performed at [facility], [address] [place], [place] [address]   CBC     Status: None   Collection Time: 01/24/20  6:45 PM  Result Value Ref Range   WBC 7.0 4.0 - 10.5 K/uL   RBC 4.48 3.87 - 5.11 MIL/uL   Hemoglobin 12.1 12.0 - 15.0 g/dL   HCT 37.3 36 - 46 %   MCV 83.3 80.0 - 100.0 fL   MCH 27.0 26.0 - 34.0 pg   MCHC 32.4 30.0 - 36.0 g/dL   RDW 15.4 11.5 - 15.5 %   Platelets 280 150 - 400 K/uL   nRBC 0.0 0.0 - 0.2 %    Comment: Performed at [facility], [address] [place], [place] [address]  Resp Panel by RT-PCR (Flu A&B, Covid) Nasopharyngeal Swab     Status: None   Collection Time: 01/24/20  7:24 PM   Specimen: Nasopharyngeal Swab; Nasopharyngeal(NP) swabs in vial transport medium  Result Value Ref Range   SARS Coronavirus 2 by RT PCR NEGATIVE NEGATIVE    Comment: (NOTE) SARS-CoV-2 target nucleic acids are NOT DETECTED.  The SARS-CoV-2 RNA is generally detectable in upper respiratory specimens during the acute phase of infection. The lowest concentration of SARS-CoV-2 viral copies this assay can detect is 138 copies/mL. A negative result does not preclude SARS-Cov-2 infection and should not be used as the sole basis for treatment or other patient management decisions. A negative result may occur with  improper specimen collection/handling, submission of specimen other than nasopharyngeal swab, presence of viral  mutation(s) within the areas targeted by this assay, and inadequate number of viral copies(<138 copies/mL). A negative result must be combined with clinical observations, patient history, and epidemiological information. The expected result is Negative.  Fact Sheet for Patients:  EntrepreneurPulse.com.au  Fact Sheet for Healthcare Providers:  IncredibleEmployment.be  This test is no t yet approved or cleared by the [place] FDA and  has been authorized for detection and/or diagnosis of SARS-CoV-2 by FDA under an Emergency Use Authorization (EUA). This EUA will remain  in effect (meaning this test can be used) for the duration of the COVID-19 declaration under Section 564(b)(1) of the Act, 21 U.S.C.section 360bbb-3(b)(1), unless the authorization is terminated  or revoked sooner.       Influenza A by PCR NEGATIVE NEGATIVE   Influenza B by PCR NEGATIVE NEGATIVE    Comment: (NOTE) The Xpert Xpress SARS-CoV-2/FLU/RSV plus assay is intended as an aid in the diagnosis of influenza from Nasopharyngeal swab specimens and should not be used as a sole basis for treatment. Nasal washings and aspirates are unacceptable for Xpert Xpress SARS-CoV-2/FLU/RSV testing.  Fact Sheet for Patients: EntrepreneurPulse.com.au  Fact Sheet for Healthcare Providers: IncredibleEmployment.be  This test is not yet approved or cleared by the [place] FDA and has been authorized for detection and/or diagnosis of SARS-CoV-2 by FDA under an Emergency Use Authorization (EUA). This EUA will remain in effect (meaning this test can be used) for the duration of the COVID-19 declaration under Section 564(b)(1) of the Act, 21 U.S.C. section 360bbb-3(b)(1), unless the authorization is terminated or revoked.  Performed at [facility], [address] [place], [place] [address]   Lactic acid, plasma     Status: Abnormal    Collection Time: 01/24/20  7:57 PM  Result Value Ref Range   Lactic Acid, Venous 2.7 (HH) 0.5 - 1.9 mmol/L  Comment: CRITICAL RESULT CALLED TO, READ BACK BY AND VERIFIED WITH: Marsa Aris RN AT 2035 ON 01/24/20 BY I.[email or web address] Performed at [facility], [address] [place], [place] [address]   Urinalysis, Routine w reflex microscopic     Status: Abnormal   Collection Time: 01/24/20  7:58 PM  Result Value Ref Range   Color, Urine YELLOW YELLOW   APPearance CLEAR CLEAR   Specific Gravity, Urine 1.020 1.005 - 1.030   pH 6.0 5.0 - 8.0   Glucose, UA NEGATIVE NEGATIVE mg/dL   Hgb urine dipstick NEGATIVE NEGATIVE   Bilirubin Urine NEGATIVE NEGATIVE   Ketones, ur 40 (A) NEGATIVE mg/dL   Protein, ur NEGATIVE NEGATIVE mg/dL   Nitrite NEGATIVE NEGATIVE   Leukocytes,Ua TRACE (A) NEGATIVE    Comment: Performed at [facility], [address] [place], [place] [address]  Pregnancy, urine     Status: None   Collection Time: 01/24/20  7:58 PM  Result Value Ref Range   Preg Test, Ur NEGATIVE NEGATIVE    Comment:        THE SENSITIVITY OF THIS METHODOLOGY IS >20 mIU/mL. Performed at [facility] [place], [address] [place], [place] [address]   Urinalysis, Microscopic (reflex)     Status: Abnormal   Collection Time: 01/24/20  7:58 PM  Result Value Ref Range   RBC / HPF 0-5 0 - 5 RBC/hpf   WBC, UA 0-5 0 - 5 WBC/hpf   Bacteria, UA FEW (A) NONE SEEN   Squamous Epithelial / LPF 0-5 0 - 5    Comment: Performed at [facility], [address] [place], [place] [address]  Glucose, capillary     Status: Abnormal   Collection Time: 01/25/20  4:01 AM  Result Value Ref Range   Glucose-Capillary 125 (H) 70 - 99 mg/dL    Comment: Glucose reference range applies only to samples taken after fasting for at least 8 hours.  Basic metabolic panel     Status: Abnormal   Collection Time: 01/25/20  6:12 AM  Result Value Ref Range   Sodium 137 135 - 145 mmol/L    Potassium 3.8 3.5 - 5.1 mmol/L   Chloride 100 98 - 111 mmol/L   CO2 25 22 - 32 mmol/L   Glucose, Bld 145 (H) 70 - 99 mg/dL    Comment: Glucose reference range applies only to samples taken after fasting for at least 8 hours.   BUN 11 6 - 20 mg/dL   Creatinine, Ser 0.61 0.44 - 1.00 mg/dL   Calcium 9.0 8.9 - 10.3 mg/dL   GFR, Estimated >60 >60 mL/min    Comment: (NOTE) Calculated using the CKD-EPI Creatinine Equation (2021)    Anion gap 12 5 - 15    Comment: Performed at [facility], [address] [place], [place] [address]   DG Abdomen 1 View  Result Date: 01/24/2020 CLINICAL DATA:  Status post gastric catheter placed EXAM: ABDOMEN - 1 VIEW COMPARISON:  None. FINDINGS: Gastric catheter is noted within the stomach. Relative paucity of bowel gas is noted within the abdomen. No free air is seen. IMPRESSION: Gastric catheter within the stomach. Electronically Signed   By: Inez Catalina M.D.   On: 01/24/2020 20:15   CT ABDOMEN PELVIS W CONTRAST  Result Date: 01/24/2020 CLINICAL DATA:  Abdominal pain, nausea and vomiting, history of colon cancer EXAM: CT ABDOMEN AND PELVIS WITH CONTRAST TECHNIQUE: Multidetector CT imaging  of the abdomen and pelvis was performed using the standard protocol following bolus administration of intravenous contrast. CONTRAST:  163mL OMNIPAQUE IOHEXOL 300 MG/ML  SOLN COMPARISON:  03/20/2019 FINDINGS: Lower chest: No acute pleural or parenchymal lung disease. Hepatobiliary: No focal liver abnormality is seen. Status post cholecystectomy. No biliary dilatation. Pancreas: Unremarkable. No pancreatic ductal dilatation or surrounding inflammatory changes. Spleen: Normal in size without focal abnormality. Adrenals/Urinary Tract: Adrenal glands are unremarkable. Kidneys are normal, without renal calculi, focal lesion, or hydronephrosis. Bladder is unremarkable. Stomach/Bowel: There is a left lower quadrant colostomy. There is a large parastomal hernia,  containing multiple loops of small bowel. The small bowel is dilated measuring up to 5 cm in diameter, consistent with obstruction. There is also an infraumbilical midline ventral hernia containing nondilated loops of small bowel. No bowel wall thickening. Vascular/Lymphatic: No significant vascular findings are present. No enlarged abdominal or pelvic lymph nodes. Reproductive: Status post hysterectomy. No adnexal masses. Other: No free fluid or free gas. Musculoskeletal: No acute or destructive bony lesions. Reconstructed images demonstrate no additional findings. IMPRESSION: 1. Large left lower quadrant parastomal hernia, with small-bowel obstruction involving the herniated small bowel. 2. Midline infraumbilical ventral hernia without evidence of complication. Electronically Signed   By: Randa Ngo M.D.   On: 01/24/2020 19:18   . lactated ringers 125 mL/hr at 01/25/20 0818      Assessment/Plan Hypertension Type II diabetes Asthma Morbid obesity BMI 47.8 Reported sleep apnea  SBO Constipation  Hx of rectal Ca with APR 2017 [facility]  FEN: NPO/NG/IV fluids ID:  None DVT: Lovenox Follow up:  H[facility]:  She is much better this AM.  I will order a SSE via colostomy and we can see how she does.  Continue NG, but give her some sips and chips.  We are hoping this is just constipation.  I will also do SBP.  We will follow with you.     Earnstine Regal [facility] Surgery 01/25/2020, 6:55 AM Please see Amion for pager number during day hours 7:00am-4:30pm

## 2020-01-26 LAB — GLUCOSE, CAPILLARY
Glucose-Capillary: 112 mg/dL — ABNORMAL HIGH (ref 70–99)
Glucose-Capillary: 123 mg/dL — ABNORMAL HIGH (ref 70–99)
Glucose-Capillary: 125 mg/dL — ABNORMAL HIGH (ref 70–99)
Glucose-Capillary: 128 mg/dL — ABNORMAL HIGH (ref 70–99)
Glucose-Capillary: 94 mg/dL (ref 70–99)
Glucose-Capillary: 96 mg/dL (ref 70–99)

## 2020-01-26 LAB — BASIC METABOLIC PANEL
Anion gap: 11 (ref 5–15)
BUN: 13 mg/dL (ref 6–20)
CO2: 28 mmol/L (ref 22–32)
Calcium: 8.6 mg/dL — ABNORMAL LOW (ref 8.9–10.3)
Chloride: 99 mmol/L (ref 98–111)
Creatinine, Ser: 0.62 mg/dL (ref 0.44–1.00)
GFR, Estimated: >60 mL/min (ref >60)
Glucose, Bld: 125 mg/dL — ABNORMAL HIGH (ref 70–99)
Potassium: 3.5 mmol/L (ref 3.5–5.1)
Sodium: 138 mmol/L (ref 135–145)

## 2020-01-26 LAB — MAGNESIUM: Magnesium: 2.1 mg/dL (ref 1.7–2.4)

## 2020-01-26 MED ORDER — POTASSIUM CHLORIDE 10 MEQ/100ML IV SOLN
10.0000 meq | INTRAVENOUS | Status: AC
  Administered 2020-01-26 (×5): 10 meq via INTRAVENOUS
  Filled 2020-01-26: qty 100

## 2020-01-26 MED ORDER — SORBITOL 70 % SOLN
960.0000 mL | TOPICAL_OIL | Freq: Once | ORAL | Status: AC
  Administered 2020-01-26: 960 mL via RECTAL
  Filled 2020-01-26: qty 473

## 2020-01-26 NOTE — Progress Notes (Signed)
PROGRESS NOTE    Cindy Peterson  ALP:[id number] DOB: Feb 07, 1966 DOA: 01/24/2020 PCP: Karlene Einstein, MD    Brief Narrative:  54 year old female with history of colon cancer and prior colectomy with colostomy and parastomal hernia, recurrent large bowel obstruction presented to the emergency room with abdominal pain and poor output from ostomy. Admitted to hospital for treatment and management followed by surgery.   Assessment & Plan:   Principal Problem:   Small bowel obstruction (HCC) Active Problems:   Parastomal hernia   HTN (hypertension)  Partial small bowel obstruction/parastomal hernia/ileus with constipation: Followed by surgery. Anticipating conservative management. N.p.o., NG drain, adequate pain medications and mobility Repeating enema through colostomy. IV fluid. Electrolyte replacement. Waiting for lab test today.  Essential hypertension: Blood pressure is stable. On as needed medications. Unable to take by mouth.  Type 2 diabetes: On Metformin at home. Fairly controlled. Currently remains n.p.o. She is on sliding scale.   DVT prophylaxis: enoxaparin (LOVENOX) injection 40 mg Start: 01/25/20 0600   Code Status: Full code Family Communication: None Disposition Plan: Status is: Inpatient  Remains inpatient appropriate because:IV treatments appropriate due to intensity of illness or inability to take PO and Inpatient level of care appropriate due to severity of illness   Dispo: The patient is from: Home              Anticipated d/c is to: Home              Anticipated d/c date is: 2 days              Patient currently is not medically stable to d/c.         Consultants:   Surgery  Procedures:   None  Antimicrobials:   None   Subjective: Patient seen and examined. Overnight NG tube was replaced because it fell off. Patient still has some pain on her stomal area, however her pain in upper abdomen is better. Feels less distended than  yesterday. No meaningful output on her colostomy bag. Thin liquid present.  Objective: Vitals:   01/25/20 2256 01/26/20 0209 01/26/20 0626 01/26/20 0901  BP: (!) 149/83 (!) 141/69 (!) 149/84 139/87  Pulse: (!) 101 93 95 91  Resp: 18 18 17 18   Temp: 98.2 F (36.8 C) 98 F (36.7 C) 97.9 F (36.6 C) (!) 97.5 F (36.4 C)  TempSrc: Oral Oral Oral Oral  SpO2: 97% 93% 96% 98%  Weight:      Height:        Intake/Output Summary (Last 24 hours) at 01/26/2020 1044 Last data filed at 01/26/2020 0904 Gross per 24 hour  Intake 2674.72 ml  Output 2650 ml  Net 24.72 ml   Filed Weights   01/24/20 1655  Weight: 122.5 kg    Examination:  General exam: Appears calm and comfortable, on room air. Respiratory system: Clear to auscultation. Respiratory effort normal. Cardiovascular system: S1 & S2 heard, RRR. No JVD, murmurs, rubs, gallops or clicks. No pedal edema. Gastrointestinal system: Abdomen is large and pendulous. Nontender. Left lower quadrant colostomy present, scant thin secretions present. No stool. Central nervous system: Alert and oriented. No focal neurological deficits. Extremities: Symmetric 5 x 5 power. Skin: No rashes, lesions or ulcers Psychiatry: Judgement and insight appear normal. Mood & affect appropriate.     Data Reviewed: I have personally reviewed following labs and imaging studies  CBC: Recent Labs  Lab 01/24/20 1845  WBC 7.0  HGB 12.1  HCT 37.3  MCV 83.3  PLT 335   Basic Metabolic Panel: Recent Labs  Lab 01/24/20 1810 01/25/20 0612  NA 134* 137  K 4.2 3.8  CL 99 100  CO2 23 25  GLUCOSE 154* 145*  BUN 13 11  CREATININE 0.67 0.61  CALCIUM 9.3 9.0   GFR: Estimated Creatinine Clearance: 104.4 mL/min (by C-G formula based on SCr of 0.61 mg/dL). Liver Function Tests: Recent Labs  Lab 01/24/20 1810  AST 25  ALT 37  ALKPHOS 71  BILITOT 0.5  PROT 8.8*  ALBUMIN 4.7   Recent Labs  Lab 01/24/20 1810  LIPASE 19   No results for  input(s): AMMONIA in the last 168 hours. Coagulation Profile: No results for input(s): INR, PROTIME in the last 168 hours. Cardiac Enzymes: No results for input(s): CKTOTAL, CKMB, CKMBINDEX, TROPONINI in the last 168 hours. BNP (last 3 results) No results for input(s): PROBNP in the last 8760 hours. HbA1C: Recent Labs    01/25/20 0612  HGBA1C 6.2*   CBG: Recent Labs  Lab 01/25/20 1638 01/25/20 2018 01/26/20 0010 01/26/20 0408 01/26/20 0751  GLUCAP 105* 117* 112* 123* 125*   Lipid Profile: No results for input(s): CHOL, HDL, LDLCALC, TRIG, CHOLHDL, LDLDIRECT in the last 72 hours. Thyroid Function Tests: No results for input(s): TSH, T4TOTAL, FREET4, T3FREE, THYROIDAB in the last 72 hours. Anemia Panel: No results for input(s): VITAMINB12, FOLATE, FERRITIN, TIBC, IRON, RETICCTPCT in the last 72 hours. Sepsis Labs: Recent Labs  Lab 01/24/20 1810 01/24/20 1957 01/25/20 0922  LATICACIDVEN 2.1* 2.7* 1.9    Recent Results (from the past 240 hour(s))  Resp Panel by RT-PCR (Flu A&B, Covid) Nasopharyngeal Swab     Status: None   Collection Time: 01/24/20  7:24 PM   Specimen: Nasopharyngeal Swab; Nasopharyngeal(NP) swabs in vial transport medium  Result Value Ref Range Status   SARS Coronavirus 2 by RT PCR NEGATIVE NEGATIVE Final    Comment: (NOTE) SARS-CoV-2 target nucleic acids are NOT DETECTED.  The SARS-CoV-2 RNA is generally detectable in upper respiratory specimens during the acute phase of infection. The lowest concentration of SARS-CoV-2 viral copies this assay can detect is 138 copies/mL. A negative result does not preclude SARS-Cov-2 infection and should not be used as the sole basis for treatment or other patient management decisions. A negative result may occur with  improper specimen collection/handling, submission of specimen other than nasopharyngeal swab, presence of viral mutation(s) within the areas targeted by this assay, and inadequate number of  viral copies(<138 copies/mL). A negative result must be combined with clinical observations, patient history, and epidemiological information. The expected result is Negative.  Fact Sheet for Patients:  EntrepreneurPulse.com.au  Fact Sheet for Healthcare Providers:  IncredibleEmployment.be  This test is no t yet approved or cleared by the [place] FDA and  has been authorized for detection and/or diagnosis of SARS-CoV-2 by FDA under an Emergency Use Authorization (EUA). This EUA will remain  in effect (meaning this test can be used) for the duration of the COVID-19 declaration under Section 564(b)(1) of the Act, 21 U.S.C.section 360bbb-3(b)(1), unless the authorization is terminated  or revoked sooner.       Influenza A by PCR NEGATIVE NEGATIVE Final   Influenza B by PCR NEGATIVE NEGATIVE Final    Comment: (NOTE) The Xpert Xpress SARS-CoV-2/FLU/RSV plus assay is intended as an aid in the diagnosis of influenza from Nasopharyngeal swab specimens and should not be used as a sole basis for treatment. Nasal washings and aspirates are unacceptable for Xpert Xpress  SARS-CoV-2/FLU/RSV testing.  Fact Sheet for Patients: EntrepreneurPulse.com.au  Fact Sheet for Healthcare Providers: IncredibleEmployment.be  This test is not yet approved or cleared by the [place] FDA and has been authorized for detection and/or diagnosis of SARS-CoV-2 by FDA under an Emergency Use Authorization (EUA). This EUA will remain in effect (meaning this test can be used) for the duration of the COVID-19 declaration under Section 564(b)(1) of the Act, 21 U.S.C. section 360bbb-3(b)(1), unless the authorization is terminated or revoked.  Performed at [facility], [address] [place],  [address]          Radiology Studies: DG Abd 1 View  Result Date: 01/25/2020 CLINICAL DATA:  NG placement  assessment EXAM: ABDOMEN - 1 VIEW COMPARISON:  Radiograph 01/25/2020 FINDINGS: Radiograph encompasses much of the chest and portion of the upper abdomen. This is not a complete assessment of the abdomen itself. A transesophageal tube is seen coiling in the left upper quadrant with tip and side port distal to the GE junction. Small amount of high attenuation material is present in the gastric lumen as well, likely retained contrast media. Small-bowel dilatation seen on comparison abdominal radiographs is less well assessed given incomplete visualization of the abdomen. Atelectatic changes are present in the lungs. The cardiomediastinal contours are unremarkable. IMPRESSION: 1. A transesophageal tube is seen coiling in the left upper quadrant with tip and side port distal to the GE junction. 2. Small amount of high attenuation material in the gastric lumen, likely retained contrast media. Electronically Signed   By: Lovena Le M.D.   On: 01/25/2020 23:05   DG Abdomen 1 View  Result Date: 01/24/2020 CLINICAL DATA:  Status post gastric catheter placed EXAM: ABDOMEN - 1 VIEW COMPARISON:  None. FINDINGS: Gastric catheter is noted within the stomach. Relative paucity of bowel gas is noted within the abdomen. No free air is seen. IMPRESSION: Gastric catheter within the stomach. Electronically Signed   By: Inez Catalina M.D.   On: 01/24/2020 20:15   CT ABDOMEN PELVIS W CONTRAST  Result Date: 01/24/2020 CLINICAL DATA:  Abdominal pain, nausea and vomiting, history of colon cancer EXAM: CT ABDOMEN AND PELVIS WITH CONTRAST TECHNIQUE: Multidetector CT imaging of the abdomen and pelvis was performed using the standard protocol following bolus administration of intravenous contrast. CONTRAST:  165mL OMNIPAQUE IOHEXOL 300 MG/ML  SOLN COMPARISON:  03/20/2019 FINDINGS: Lower chest: No acute pleural or parenchymal lung disease. Hepatobiliary: No focal liver abnormality is seen. Status post cholecystectomy. No biliary  dilatation. Pancreas: Unremarkable. No pancreatic ductal dilatation or surrounding inflammatory changes. Spleen: Normal in size without focal abnormality. Adrenals/Urinary Tract: Adrenal glands are unremarkable. Kidneys are normal, without renal calculi, focal lesion, or hydronephrosis. Bladder is unremarkable. Stomach/Bowel: There is a left lower quadrant colostomy. There is a large parastomal hernia, containing multiple loops of small bowel. The small bowel is dilated measuring up to 5 cm in diameter, consistent with obstruction. There is also an infraumbilical midline ventral hernia containing nondilated loops of small bowel. No bowel wall thickening. Vascular/Lymphatic: No significant vascular findings are present. No enlarged abdominal or pelvic lymph nodes. Reproductive: Status post hysterectomy. No adnexal masses. Other: No free fluid or free gas. Musculoskeletal: No acute or destructive bony lesions. Reconstructed images demonstrate no additional findings. IMPRESSION: 1. Large left lower quadrant parastomal hernia, with small-bowel obstruction involving the herniated small bowel. 2. Midline infraumbilical ventral hernia without evidence of complication. Electronically Signed   By: Randa Ngo M.D.   On: 01/24/2020  19:18   DG Abd Portable 1V-Small Bowel Obstruction Protocol-initial, 8 hr delay  Result Date: 01/25/2020 CLINICAL DATA:  8 hour follow-up exam for small-bowel obstruction. EXAM: PORTABLE ABDOMEN - 1 VIEW COMPARISON:  Film from earlier in the same day. FINDINGS: Gastric catheter is noted within the stomach. A small amount of contrast is noted with stomach as well. Persistent small bowel dilatation is seen. The large peristomal hernia is again noted on the left. No definitive contrast is noted within the more distal small bowel. Continued follow-up is recommended. IMPRESSION: Minimal contrast within the stomach. No definitive contrast is noted within the colon. Continued follow-up is  recommended. Small bowel dilatation remains. Electronically Signed   By: Inez Catalina M.D.   On: 01/25/2020 20:31   DG Abd Portable 1V-Small Bowel Protocol-Position Verification  Result Date: 01/25/2020 CLINICAL DATA:  NG tube placement. EXAM: PORTABLE ABDOMEN - 1 VIEW COMPARISON:  01/24/2020. FINDINGS: NG tube noted with tip over the stomach. Surgical clips right upper quadrant. No bowel distention. Stool noted throughout the colon. No free air. Mild bibasilar atelectasis. Degenerative change thoracolumbar spine. IMPRESSION: NG tube noted with tip over the stomach. Electronically Signed   By: [place]   On: 01/25/2020 11:41        Scheduled Meds: . enoxaparin (LOVENOX) injection  40 mg Subcutaneous Q24H  . insulin aspart  0-9 Units Subcutaneous Q4H  . sorbitol, milk of mag, mineral oil, glycerin (SMOG) enema  960 mL Rectal Once   Continuous Infusions: . lactated ringers 125 mL/hr at 01/26/20 0800     LOS: 2 days    Time spent: 30 minutes    Barb Merino, MD [facility] Pager [phone]

## 2020-01-26 NOTE — Progress Notes (Signed)
Progress Note     Subjective: Patient reports some tea colored liquid output from stoma after stomal irrigation yesterday. Having more flatus via stoma. Asking about getting some miralax via NGT. Has been ambulating to bathroom but not in halls. Denies overt abdominal pain but still feels bloated.   Objective: Vital signs in last 24 hours: Temp:  [97.5 F (36.4 C)-98.6 F (37 C)] 97.5 F (36.4 C) (12/01 0901) Pulse Rate:  [87-101] 91 (12/01 0901) Resp:  [14-18] 18 (12/01 0901) BP: (133-150)/(69-92) 139/87 (12/01 0901) SpO2:  [93 %-98 %] 98 % (12/01 0901) Last BM Date: 01/24/20  Intake/Output from previous day: 11/30 0701 - 12/01 0700 In: 2755.8 [I.V.:2665.8; NG/GT:90] Out: 27 [Urine:1150; Emesis/NG output:1250; Stool:200] Intake/Output this shift: Total I/O In: 0  Out: 650 [Emesis/NG output:650]  PE:  General: pleasant, WD, obese female who is laying in bed in NAD Lungs:  Respiratory effort nonlabored Abd: soft, NT, distended, BS hypoactive, left sided colostomy with scant fluid, parastomal hernia Psych: A&Ox3 with an appropriate affect.    Lab Results:  Recent Labs    01/24/20 1845  WBC 7.0  HGB 12.1  HCT 37.3  PLT 280   BMET Recent Labs    01/24/20 1810 01/25/20 0612  NA 134* 137  K 4.2 3.8  CL 99 100  CO2 23 25  GLUCOSE 154* 145*  BUN 13 11  CREATININE 0.67 0.61  CALCIUM 9.3 9.0   PT/INR No results for input(s): LABPROT, INR in the last 72 hours. CMP     Component Value Date/Time   NA 137 01/25/2020 0612   K 3.8 01/25/2020 0612   CL 100 01/25/2020 0612   CO2 25 01/25/2020 0612   GLUCOSE 145 (H) 01/25/2020 0612   BUN 11 01/25/2020 0612   CREATININE 0.61 01/25/2020 0612   CALCIUM 9.0 01/25/2020 0612   PROT 8.8 (H) 01/24/2020 1810   ALBUMIN 4.7 01/24/2020 1810   AST 25 01/24/2020 1810   ALT 37 01/24/2020 1810   ALKPHOS 71 01/24/2020 1810   BILITOT 0.5 01/24/2020 1810   GFRNONAA >60 01/25/2020 0612   GFRAA >60 10/07/2015 0650    Lipase     Component Value Date/Time   LIPASE 19 01/24/2020 1810       Studies/Results: DG Abd 1 View  Result Date: 01/25/2020 CLINICAL DATA:  NG placement assessment EXAM: ABDOMEN - 1 VIEW COMPARISON:  Radiograph 01/25/2020 FINDINGS: Radiograph encompasses much of the chest and portion of the upper abdomen. This is not a complete assessment of the abdomen itself. A transesophageal tube is seen coiling in the left upper quadrant with tip and side port distal to the GE junction. Small amount of high attenuation material is present in the gastric lumen as well, likely retained contrast media. Small-bowel dilatation seen on comparison abdominal radiographs is less well assessed given incomplete visualization of the abdomen. Atelectatic changes are present in the lungs. The cardiomediastinal contours are unremarkable. IMPRESSION: 1. A transesophageal tube is seen coiling in the left upper quadrant with tip and side port distal to the GE junction. 2. Small amount of high attenuation material in the gastric lumen, likely retained contrast media. Electronically Signed   By: Lovena Le M.D.   On: 01/25/2020 23:05   DG Abdomen 1 View  Result Date: 01/24/2020 CLINICAL DATA:  Status post gastric catheter placed EXAM: ABDOMEN - 1 VIEW COMPARISON:  None. FINDINGS: Gastric catheter is noted within the stomach. Relative paucity of bowel gas is noted within the abdomen.  No free air is seen. IMPRESSION: Gastric catheter within the stomach. Electronically Signed   By: Inez Catalina M.D.   On: 01/24/2020 20:15   CT ABDOMEN PELVIS W CONTRAST  Result Date: 01/24/2020 CLINICAL DATA:  Abdominal pain, nausea and vomiting, history of colon cancer EXAM: CT ABDOMEN AND PELVIS WITH CONTRAST TECHNIQUE: Multidetector CT imaging of the abdomen and pelvis was performed using the standard protocol following bolus administration of intravenous contrast. CONTRAST:  123mL OMNIPAQUE IOHEXOL 300 MG/ML  SOLN COMPARISON:   03/20/2019 FINDINGS: Lower chest: No acute pleural or parenchymal lung disease. Hepatobiliary: No focal liver abnormality is seen. Status post cholecystectomy. No biliary dilatation. Pancreas: Unremarkable. No pancreatic ductal dilatation or surrounding inflammatory changes. Spleen: Normal in size without focal abnormality. Adrenals/Urinary Tract: Adrenal glands are unremarkable. Kidneys are normal, without renal calculi, focal lesion, or hydronephrosis. Bladder is unremarkable. Stomach/Bowel: There is a left lower quadrant colostomy. There is a large parastomal hernia, containing multiple loops of small bowel. The small bowel is dilated measuring up to 5 cm in diameter, consistent with obstruction. There is also an infraumbilical midline ventral hernia containing nondilated loops of small bowel. No bowel wall thickening. Vascular/Lymphatic: No significant vascular findings are present. No enlarged abdominal or pelvic lymph nodes. Reproductive: Status post hysterectomy. No adnexal masses. Other: No free fluid or free gas. Musculoskeletal: No acute or destructive bony lesions. Reconstructed images demonstrate no additional findings. IMPRESSION: 1. Large left lower quadrant parastomal hernia, with small-bowel obstruction involving the herniated small bowel. 2. Midline infraumbilical ventral hernia without evidence of complication. Electronically Signed   By: Randa Ngo M.D.   On: 01/24/2020 19:18   DG Abd Portable 1V-Small Bowel Obstruction Protocol-initial, 8 hr delay  Result Date: 01/25/2020 CLINICAL DATA:  8 hour follow-up exam for small-bowel obstruction. EXAM: PORTABLE ABDOMEN - 1 VIEW COMPARISON:  Film from earlier in the same day. FINDINGS: Gastric catheter is noted within the stomach. A small amount of contrast is noted with stomach as well. Persistent small bowel dilatation is seen. The large peristomal hernia is again noted on the left. No definitive contrast is noted within the more distal small  bowel. Continued follow-up is recommended. IMPRESSION: Minimal contrast within the stomach. No definitive contrast is noted within the colon. Continued follow-up is recommended. Small bowel dilatation remains. Electronically Signed   By: Inez Catalina M.D.   On: 01/25/2020 20:31   DG Abd Portable 1V-Small Bowel Protocol-Position Verification  Result Date: 01/25/2020 CLINICAL DATA:  NG tube placement. EXAM: PORTABLE ABDOMEN - 1 VIEW COMPARISON:  01/24/2020. FINDINGS: NG tube noted with tip over the stomach. Surgical clips right upper quadrant. No bowel distention. Stool noted throughout the colon. No free air. Mild bibasilar atelectasis. Degenerative change thoracolumbar spine. IMPRESSION: NG tube noted with tip over the stomach. Electronically Signed   By: Marcello Moores  Register   On: 01/25/2020 11:41    Anti-infectives: Anti-infectives (From admission, onward)   None       Assessment/Plan Hypertension Type II diabetes Asthma Morbid obesity BMI 47.8 Reported sleep apnea  SBO Constipation  Hx of rectal Ca with APR [facility] hospital - 8h film without clear contrast in colon, NGT replaced overnight - Some stool out with stomal enema yesterday, can repeat this today - continue NGT to LIWS  - mobilize more today  - repeat labs today - recommend keeping K >4.0 and Mg >2.0   FEN: NPO/NG/IV fluids ID:  None DVT: Lovenox Follow up:  Hoping she can return to  [facility]   LOS: 2 days    Norm Parcel , [facility] Surgery 01/26/2020, 9:43 AM Please see Amion for pager number during day hours 7:00am-4:30pm

## 2020-01-26 NOTE — Consult Note (Signed)
[place] Nurse ostomy consult note [place] Nurse requested for SSE via Colostomy this am.  Bedside RN instructed on procedure yesterday and assisted this Probation officer with procedure.  She Jarrett Soho, Therapist, sports) to perform SSE via colostomy this am. Supplies in room. [facility] PA Sandria Senter notified via text.  [place] nursing team will not follow, but will remain available to this patient, the nursing and medical teams.  Please re-consult if needed. Thanks, Maudie Flakes, MSN, RN, [place], Arther Abbott  Pager# [phone]

## 2020-01-27 ENCOUNTER — Inpatient Hospital Stay (HOSPITAL_COMMUNITY): Payer: BC Managed Care – PPO

## 2020-01-27 LAB — BASIC METABOLIC PANEL
Anion gap: 11 (ref 5–15)
BUN: 11 mg/dL (ref 6–20)
CO2: 28 mmol/L (ref 22–32)
Calcium: 8.7 mg/dL — ABNORMAL LOW (ref 8.9–10.3)
Chloride: 99 mmol/L (ref 98–111)
Creatinine, Ser: 0.61 mg/dL (ref 0.44–1.00)
GFR, Estimated: >60 mL/min (ref >60)
Glucose, Bld: 110 mg/dL — ABNORMAL HIGH (ref 70–99)
Potassium: 3.8 mmol/L (ref 3.5–5.1)
Sodium: 138 mmol/L (ref 135–145)

## 2020-01-27 LAB — GLUCOSE, CAPILLARY
Glucose-Capillary: 107 mg/dL — ABNORMAL HIGH (ref 70–99)
Glucose-Capillary: 109 mg/dL — ABNORMAL HIGH (ref 70–99)
Glucose-Capillary: 93 mg/dL (ref 70–99)
Glucose-Capillary: 94 mg/dL (ref 70–99)
Glucose-Capillary: 94 mg/dL (ref 70–99)
Glucose-Capillary: 96 mg/dL (ref 70–99)

## 2020-01-27 MED ORDER — SORBITOL 70 % SOLN
960.0000 mL | TOPICAL_OIL | Freq: Once | ORAL | Status: AC
  Administered 2020-01-27: 960 mL via RECTAL
  Filled 2020-01-27: qty 473

## 2020-01-27 MED ORDER — POTASSIUM CHLORIDE 10 MEQ/100ML IV SOLN
10.0000 meq | INTRAVENOUS | Status: AC
  Administered 2020-01-27 (×3): 10 meq via INTRAVENOUS
  Filled 2020-01-27 (×3): qty 100

## 2020-01-27 MED ORDER — ALUM & MAG HYDROXIDE-SIMETH 200-200-20 MG/5ML PO SUSP
30.0000 mL | Freq: Four times a day (QID) | ORAL | Status: DC | PRN
  Administered 2020-01-27 – 2020-01-30 (×4): 30 mL via ORAL
  Filled 2020-01-27 (×4): qty 30

## 2020-01-27 NOTE — Progress Notes (Addendum)
Progress Note     Subjective: Patient reports abdominal pain continues to improve. She reports more stool and gas since enema yesterday. Denies nausea this AM. Taking in ice chips. Patient reports she wants to be home by the weekend, we discussed that we will continue to see how she's doing but when she goes home will depend on bowel function.   Objective: Vital signs in last 24 hours: Temp:  [97.5 F (36.4 C)-99.3 F (37.4 C)] 98 F (36.7 C) (12/02 0610) Pulse Rate:  [62-81] 72 (12/02 0610) Resp:  [16-20] 16 (12/02 0610) BP: (129-157)/(72-96) 157/95 (12/02 0610) SpO2:  [97 %-100 %] 97 % (12/02 0610) Last BM Date: 01/24/20  Intake/Output from previous day: 12/01 0701 - 12/02 0700 In: 3469.4 [I.V.:2877.1; IV Piggyback:592.3] Out: 1610 [Urine:1200; Emesis/NG output:2100; Stool:320] Intake/Output this shift: No intake/output data recorded.  PE: General: pleasant, WD, obese female who is sitting in chair NAD Lungs:  Respiratory effort nonlabored Abd: soft, NT, less distended, BS hypoactive, left sided colostomy with scant liquid stool, parastomal hernia Psych: A&Ox3 with an appropriate affect.   Lab Results:  Recent Labs    01/24/20 1845  WBC 7.0  HGB 12.1  HCT 37.3  PLT 280   BMET Recent Labs    01/26/20 1125 01/27/20 0536  NA 138 138  K 3.5 3.8  CL 99 99  CO2 28 28  GLUCOSE 125* 110*  BUN 13 11  CREATININE 0.62 0.61  CALCIUM 8.6* 8.7*   PT/INR No results for input(s): LABPROT, INR in the last 72 hours. CMP     Component Value Date/Time   NA 138 01/27/2020 0536   K 3.8 01/27/2020 0536   CL 99 01/27/2020 0536   CO2 28 01/27/2020 0536   GLUCOSE 110 (H) 01/27/2020 0536   BUN 11 01/27/2020 0536   CREATININE 0.61 01/27/2020 0536   CALCIUM 8.7 (L) 01/27/2020 0536   PROT 8.8 (H) 01/24/2020 1810   ALBUMIN 4.7 01/24/2020 1810   AST 25 01/24/2020 1810   ALT 37 01/24/2020 1810   ALKPHOS 71 01/24/2020 1810   BILITOT 0.5 01/24/2020 1810   GFRNONAA >60  01/27/2020 0536   GFRAA >60 10/07/2015 0650   Lipase     Component Value Date/Time   LIPASE 19 01/24/2020 1810       Studies/Results: DG Abd 1 View  Result Date: 01/25/2020 CLINICAL DATA:  NG placement assessment EXAM: ABDOMEN - 1 VIEW COMPARISON:  Radiograph 01/25/2020 FINDINGS: Radiograph encompasses much of the chest and portion of the upper abdomen. This is not a complete assessment of the abdomen itself. A transesophageal tube is seen coiling in the left upper quadrant with tip and side port distal to the GE junction. Small amount of high attenuation material is present in the gastric lumen as well, likely retained contrast media. Small-bowel dilatation seen on comparison abdominal radiographs is less well assessed given incomplete visualization of the abdomen. Atelectatic changes are present in the lungs. The cardiomediastinal contours are unremarkable. IMPRESSION: 1. A transesophageal tube is seen coiling in the left upper quadrant with tip and side port distal to the GE junction. 2. Small amount of high attenuation material in the gastric lumen, likely retained contrast media. Electronically Signed   By: Lovena Le M.D.   On: 01/25/2020 23:05   DG Abd Portable 1V  Result Date: 01/27/2020 CLINICAL DATA:  Small-bowel obstruction. EXAM: PORTABLE ABDOMEN - 1 VIEW COMPARISON:  01/25/2020.  CT 01/24/2020. FINDINGS: NG tube noted stomach. Surgical clips right  upper quadrant. Persistent dilated loop of small bowel noted. Stool noted in the colon. No free air identified. Degenerative change lumbar spine. IMPRESSION: NG tube noted the stomach. Persistent dilated loop of small bowel noted. Stool noted in the colon. Reference is made to CT report 01/24/2020. Electronically Signed   By: Marcello Moores  Register   On: 01/27/2020 06:19   DG Abd Portable 1V-Small Bowel Obstruction Protocol-initial, 8 hr delay  Result Date: 01/25/2020 CLINICAL DATA:  8 hour follow-up exam for small-bowel obstruction. EXAM:  PORTABLE ABDOMEN - 1 VIEW COMPARISON:  Film from earlier in the same day. FINDINGS: Gastric catheter is noted within the stomach. A small amount of contrast is noted with stomach as well. Persistent small bowel dilatation is seen. The large peristomal hernia is again noted on the left. No definitive contrast is noted within the more distal small bowel. Continued follow-up is recommended. IMPRESSION: Minimal contrast within the stomach. No definitive contrast is noted within the colon. Continued follow-up is recommended. Small bowel dilatation remains. Electronically Signed   By: Inez Catalina M.D.   On: 01/25/2020 20:31   DG Abd Portable 1V-Small Bowel Protocol-Position Verification  Result Date: 01/25/2020 CLINICAL DATA:  NG tube placement. EXAM: PORTABLE ABDOMEN - 1 VIEW COMPARISON:  01/24/2020. FINDINGS: NG tube noted with tip over the stomach. Surgical clips right upper quadrant. No bowel distention. Stool noted throughout the colon. No free air. Mild bibasilar atelectasis. Degenerative change thoracolumbar spine. IMPRESSION: NG tube noted with tip over the stomach. Electronically Signed   By: Marcello Moores  Register   On: 01/25/2020 11:41    Anti-infectives: Anti-infectives (From admission, onward)   None       Assessment/Plan Hypertension Type II diabetes Asthma Morbid obesity BMI 47.8 Reported sleep apnea  SBO Constipation  Hx of rectal Ca with APR 2017 [facility] hospital - film this AM with persistent dilated loop of small bowel and stool in colon  - Some stool out with stomal enema yesterday, can repeat again today  - clamp NGT and allow some sips of clears   - mobilize more today  - recommend keeping K >4.0 and Mg >2.0   FEN: NGT clamping, sips of clears, IVF ID: None DVT: Lovenox Follow up: Hoping she can return to [facility]   LOS: 3 days    Norm Parcel , [facility] Surgery 01/27/2020, 9:12 AM Please see Amion for pager number during day hours  7:00am-4:30pm

## 2020-01-27 NOTE — Progress Notes (Signed)
Patient NG tube came out, notified on call doctor and ordered to put another one. We will continue to monitor.

## 2020-01-27 NOTE — Progress Notes (Signed)
PROGRESS NOTE    Cindy Peterson  JEH:[id number] DOB: [birth year] DOA: 01/24/2020 PCP: Karlene Einstein, MD    Brief Narrative:  54 year old female with history of colon cancer and prior colectomy with colostomy and parastomal hernia, recurrent large bowel obstruction presented to the emergency room with abdominal pain and poor output from ostomy. Admitted to hospital for treatment and management followed by surgery.   Assessment & Plan:   Principal Problem:   Small bowel obstruction (HCC) Active Problems:   Parastomal hernia   HTN (hypertension)  Partial small bowel obstruction/parastomal hernia/ileus with constipation: Followed by surgery. Anticipating conservative management. Some clinical improvement today.  NG clamping trial and clears today.  adequate pain medications and mobility IV fluid. Electrolyte replacement.  Potassium magnesium adequate.  Essential hypertension: Blood pressure is stable. On as needed medications. Unable to take by mouth.  Will resume oral antihypertensives once she is able to have NG tube out.  Type 2 diabetes: On Metformin at home. Fairly controlled.  On sliding scale.  Resume Metformin on discharge.  A1c 6.2.   DVT prophylaxis: enoxaparin (LOVENOX) injection 40 mg Start: 01/25/20 0600   Code Status: Full code Family Communication: None Disposition Plan: Status is: Inpatient  Remains inpatient appropriate because:IV treatments appropriate due to intensity of illness or inability to take PO and Inpatient level of care appropriate due to severity of illness   Dispo: The patient is from: Home              Anticipated d/c is to: Home              Anticipated d/c date is: Architectural technologist.              Patient currently is not medically stable to d/c.         Consultants:   Surgery  Procedures:   None  Antimicrobials:   None   Subjective: Seen and examined.  Most of the abdomen pain and distention gone.  Eager to go home.  Denies any  nausea vomiting.  Tolerating clamping trial.  Objective: Vitals:   01/26/20 2208 01/27/20 0140 01/27/20 0610 01/27/20 1027  BP: (!) 144/77 129/72 (!) 157/95 (!) 152/85  Pulse: 72 72 72 60  Resp: 16 18 16 15   Temp: 99.3 F (37.4 C) 98.5 F (36.9 C) 98 F (36.7 C) 98.4 F (36.9 C)  TempSrc: Oral Oral Oral Oral  SpO2: 99% 98% 97% 100%  Weight:      Height:        Intake/Output Summary (Last 24 hours) at 01/27/2020 1142 Last data filed at 01/27/2020 1032 Gross per 24 hour  Intake 3156.17 ml  Output 3665 ml  Net -508.83 ml   Filed Weights   01/24/20 1655  Weight: 122.5 kg    Examination:  General exam: Appears calm and comfortable, on room air. Respiratory system: Clear to auscultation. Respiratory effort normal. Cardiovascular system: S1 & S2 heard, RRR. No JVD, murmurs, rubs, gallops or clicks. No pedal edema. Gastrointestinal system: Abdomen is large and pendulous. Nontender. Left lower quadrant colostomy present, do not see any stool. Central nervous system: Alert and oriented. No focal neurological deficits. Extremities: Symmetric 5 x 5 power. Skin: No rashes, lesions or ulcers Psychiatry: Judgement and insight appear normal. Mood & affect appropriate.     Data Reviewed: I have personally reviewed following labs and imaging studies  CBC: Recent Labs  Lab 01/24/20 1845  WBC 7.0  HGB 12.1  HCT 37.3  MCV 83.3  PLT  417   Basic Metabolic Panel: Recent Labs  Lab 01/24/20 1810 01/25/20 0612 01/26/20 1125 01/27/20 0536  NA 134* 137 138 138  K 4.2 3.8 3.5 3.8  CL 99 100 99 99  CO2 23 25 28 28   GLUCOSE 154* 145* 125* 110*  BUN 13 11 13 11   CREATININE 0.67 0.61 0.62 0.61  CALCIUM 9.3 9.0 8.6* 8.7*  MG  --   --  2.1  --    GFR: Estimated Creatinine Clearance: 104.4 mL/min (by C-G formula based on SCr of 0.61 mg/dL). Liver Function Tests: Recent Labs  Lab 01/24/20 1810  AST 25  ALT 37  ALKPHOS 71  BILITOT 0.5  PROT 8.8*  ALBUMIN 4.7   Recent  Labs  Lab 01/24/20 1810  LIPASE 19   No results for input(s): AMMONIA in the last 168 hours. Coagulation Profile: No results for input(s): INR, PROTIME in the last 168 hours. Cardiac Enzymes: No results for input(s): CKTOTAL, CKMB, CKMBINDEX, TROPONINI in the last 168 hours. BNP (last 3 results) No results for input(s): PROBNP in the last 8760 hours. HbA1C: Recent Labs    01/25/20 0612  HGBA1C 6.2*   CBG: Recent Labs  Lab 01/26/20 1624 01/26/20 2006 01/27/20 0009 01/27/20 0419 01/27/20 0747  GLUCAP 96 94 94 96 107*   Lipid Profile: No results for input(s): CHOL, HDL, LDLCALC, TRIG, CHOLHDL, LDLDIRECT in the last 72 hours. Thyroid Function Tests: No results for input(s): TSH, T4TOTAL, FREET4, T3FREE, THYROIDAB in the last 72 hours. Anemia Panel: No results for input(s): VITAMINB12, FOLATE, FERRITIN, TIBC, IRON, RETICCTPCT in the last 72 hours. Sepsis Labs: Recent Labs  Lab 01/24/20 1810 01/24/20 1957 01/25/20 0922  LATICACIDVEN 2.1* 2.7* 1.9    Recent Results (from the past 240 hour(s))  Resp Panel by RT-PCR (Flu A&B, Covid) Nasopharyngeal Swab     Status: None   Collection Time: 01/24/20  7:24 PM   Specimen: Nasopharyngeal Swab; Nasopharyngeal(NP) swabs in vial transport medium  Result Value Ref Range Status   SARS Coronavirus 2 by RT PCR NEGATIVE NEGATIVE Final    Comment: (NOTE) SARS-CoV-2 target nucleic acids are NOT DETECTED.  The SARS-CoV-2 RNA is generally detectable in upper respiratory specimens during the acute phase of infection. The lowest concentration of SARS-CoV-2 viral copies this assay can detect is 138 copies/mL. A negative result does not preclude SARS-Cov-2 infection and should not be used as the sole basis for treatment or other patient management decisions. A negative result may occur with  improper specimen collection/handling, submission of specimen other than nasopharyngeal swab, presence of viral mutation(s) within the areas  targeted by this assay, and inadequate number of viral copies(<138 copies/mL). A negative result must be combined with clinical observations, patient history, and epidemiological information. The expected result is Negative.  Fact Sheet for Patients:  EntrepreneurPulse.com.au  Fact Sheet for Healthcare Providers:  IncredibleEmployment.be  This test is no t yet approved or cleared by the [place] FDA and  has been authorized for detection and/or diagnosis of SARS-CoV-2 by FDA under an Emergency Use Authorization (EUA). This EUA will remain  in effect (meaning this test can be used) for the duration of the COVID-19 declaration under Section 564(b)(1) of the Act, 21 U.S.C.section 360bbb-3(b)(1), unless the authorization is terminated  or revoked sooner.       Influenza A by PCR NEGATIVE NEGATIVE Final   Influenza B by PCR NEGATIVE NEGATIVE Final    Comment: (NOTE) The Xpert Xpress SARS-CoV-2/FLU/RSV plus assay is intended as  an aid in the diagnosis of influenza from Nasopharyngeal swab specimens and should not be used as a sole basis for treatment. Nasal washings and aspirates are unacceptable for Xpert Xpress SARS-CoV-2/FLU/RSV testing.  Fact Sheet for Patients: EntrepreneurPulse.com.au  Fact Sheet for Healthcare Providers: IncredibleEmployment.be  This test is not yet approved or cleared by the [place] FDA and has been authorized for detection and/or diagnosis of SARS-CoV-2 by FDA under an Emergency Use Authorization (EUA). This EUA will remain in effect (meaning this test can be used) for the duration of the COVID-19 declaration under Section 564(b)(1) of the Act, 21 U.S.C. section 360bbb-3(b)(1), unless the authorization is terminated or revoked.  Performed at [facility], [address] [place], [place] [address]          Radiology Studies: DG Abd 1 View  Result  Date: 01/25/2020 CLINICAL DATA:  NG placement assessment EXAM: ABDOMEN - 1 VIEW COMPARISON:  Radiograph 01/25/2020 FINDINGS: Radiograph encompasses much of the chest and portion of the upper abdomen. This is not a complete assessment of the abdomen itself. A transesophageal tube is seen coiling in the left upper quadrant with tip and side port distal to the GE junction. Small amount of high attenuation material is present in the gastric lumen as well, likely retained contrast media. Small-bowel dilatation seen on comparison abdominal radiographs is less well assessed given incomplete visualization of the abdomen. Atelectatic changes are present in the lungs. The cardiomediastinal contours are unremarkable. IMPRESSION: 1. A transesophageal tube is seen coiling in the left upper quadrant with tip and side port distal to the GE junction. 2. Small amount of high attenuation material in the gastric lumen, likely retained contrast media. Electronically Signed   By: Lovena Le M.D.   On: 01/25/2020 23:05   DG Abd Portable 1V  Result Date: 01/27/2020 CLINICAL DATA:  Small-bowel obstruction. EXAM: PORTABLE ABDOMEN - 1 VIEW COMPARISON:  01/25/2020.  CT 01/24/2020. FINDINGS: NG tube noted stomach. Surgical clips right upper quadrant. Persistent dilated loop of small bowel noted. Stool noted in the colon. No free air identified. Degenerative change lumbar spine. IMPRESSION: NG tube noted the stomach. Persistent dilated loop of small bowel noted. Stool noted in the colon. Reference is made to CT report 01/24/2020. Electronically Signed   By: Marcello Moores  Register   On: 01/27/2020 06:19   DG Abd Portable 1V-Small Bowel Obstruction Protocol-initial, 8 hr delay  Result Date: 01/25/2020 CLINICAL DATA:  8 hour follow-up exam for small-bowel obstruction. EXAM: PORTABLE ABDOMEN - 1 VIEW COMPARISON:  Film from earlier in the same day. FINDINGS: Gastric catheter is noted within the stomach. A small amount of contrast is noted with  stomach as well. Persistent small bowel dilatation is seen. The large peristomal hernia is again noted on the left. No definitive contrast is noted within the more distal small bowel. Continued follow-up is recommended. IMPRESSION: Minimal contrast within the stomach. No definitive contrast is noted within the colon. Continued follow-up is recommended. Small bowel dilatation remains. Electronically Signed   By: Inez Catalina M.D.   On: 01/25/2020 20:31        Scheduled Meds: . enoxaparin (LOVENOX) injection  40 mg Subcutaneous Q24H  . insulin aspart  0-9 Units Subcutaneous Q4H  . sorbitol, milk of mag, mineral oil, glycerin (SMOG) enema  960 mL Rectal Once   Continuous Infusions: . lactated ringers 125 mL/hr at 01/27/20 0802  . potassium chloride       LOS: 3 days  Time spent: 30 minutes    Barb Merino, MD [facility] Pager [phone]

## 2020-01-27 NOTE — Progress Notes (Signed)
French 16 NG tube successfully inserted on patients left nare patient tolerated the procedure, stat abdominal X ray was ordered to verify placement. We will continue to monitor.

## 2020-01-28 ENCOUNTER — Encounter (HOSPITAL_COMMUNITY): Payer: Self-pay | Admitting: Family Medicine

## 2020-01-28 DIAGNOSIS — Z933 Colostomy status: Secondary | ICD-10-CM

## 2020-01-28 DIAGNOSIS — K5909 Other constipation: Secondary | ICD-10-CM

## 2020-01-28 LAB — GLUCOSE, CAPILLARY
Glucose-Capillary: 112 mg/dL — ABNORMAL HIGH (ref 70–99)
Glucose-Capillary: 73 mg/dL (ref 70–99)
Glucose-Capillary: 77 mg/dL (ref 70–99)
Glucose-Capillary: 81 mg/dL (ref 70–99)
Glucose-Capillary: 85 mg/dL (ref 70–99)
Glucose-Capillary: 97 mg/dL (ref 70–99)
Glucose-Capillary: 99 mg/dL (ref 70–99)

## 2020-01-28 LAB — BASIC METABOLIC PANEL
Anion gap: 13 (ref 5–15)
BUN: 11 mg/dL (ref 6–20)
CO2: 28 mmol/L (ref 22–32)
Calcium: 8.5 mg/dL — ABNORMAL LOW (ref 8.9–10.3)
Chloride: 96 mmol/L — ABNORMAL LOW (ref 98–111)
Creatinine, Ser: 0.62 mg/dL (ref 0.44–1.00)
GFR, Estimated: >60 mL/min (ref >60)
Glucose, Bld: 114 mg/dL — ABNORMAL HIGH (ref 70–99)
Potassium: 3.7 mmol/L (ref 3.5–5.1)
Sodium: 137 mmol/L (ref 135–145)

## 2020-01-28 LAB — MAGNESIUM: Magnesium: 2.1 mg/dL (ref 1.7–2.4)

## 2020-01-28 MED ORDER — GLUCERNA SHAKE PO LIQD
237.0000 mL | Freq: Three times a day (TID) | ORAL | Status: DC
  Administered 2020-01-29: 237 mL via ORAL
  Filled 2020-01-28 (×11): qty 237

## 2020-01-28 MED ORDER — POLYETHYLENE GLYCOL 3350 17 G PO PACK
17.0000 g | PACK | Freq: Every day | ORAL | Status: DC
  Administered 2020-01-28 – 2020-01-29 (×2): 17 g via ORAL
  Filled 2020-01-28 (×2): qty 1

## 2020-01-28 MED ORDER — METHOCARBAMOL 1000 MG/10ML IJ SOLN
500.0000 mg | Freq: Three times a day (TID) | INTRAVENOUS | Status: DC | PRN
  Administered 2020-01-28: 500 mg via INTRAVENOUS
  Filled 2020-01-28 (×2): qty 5

## 2020-01-28 NOTE — Progress Notes (Signed)
Progress Note     Subjective: Patient reports back pain this AM. Denies nausea. NGT was replaced 2x overnight, but she tolerated clamping well yesterday. Denies abdominal pain this AM but does report ostomy is feeling a little uncomfortable.   Objective: Vital signs in last 24 hours: Temp:  [98.3 F (36.8 C)-99.6 F (37.6 C)] 98.3 F (36.8 C) (12/03 0635) Pulse Rate:  [60-79] 79 (12/03 0635) Resp:  [15-22] 18 (12/03 0635) BP: (129-166)/(75-96) 129/75 (12/03 0635) SpO2:  [95 %-100 %] 100 % (12/03 0635) Last BM Date: 01/26/20  Intake/Output from previous day: 12/02 0701 - 12/03 0700 In: 2811.2 [P.O.:230; I.V.:2281.2; IV Piggyback:300] Out: 6767 [Emesis/NG output:975; Stool:620] Intake/Output this shift: No intake/output data recorded.  PE: General: pleasant, WD,obesefemale who is sitting in chair NAD Lungs: Respiratory effort nonlabored Abd: soft, NT,less distended,BS hypoactive, left sided colostomy with scant liquid stool, parastomal hernia Psych: A&Ox3 with an appropriate affect.  Lab Results:  No results for input(s): WBC, HGB, HCT, PLT in the last 72 hours. BMET Recent Labs    01/27/20 0536 01/28/20 0524  NA 138 137  K 3.8 3.7  CL 99 96*  CO2 28 28  GLUCOSE 110* 114*  BUN 11 11  CREATININE 0.61 0.62  CALCIUM 8.7* 8.5*   PT/INR No results for input(s): LABPROT, INR in the last 72 hours. CMP     Component Value Date/Time   NA 137 01/28/2020 0524   K 3.7 01/28/2020 0524   CL 96 (L) 01/28/2020 0524   CO2 28 01/28/2020 0524   GLUCOSE 114 (H) 01/28/2020 0524   BUN 11 01/28/2020 0524   CREATININE 0.62 01/28/2020 0524   CALCIUM 8.5 (L) 01/28/2020 0524   PROT 8.8 (H) 01/24/2020 1810   ALBUMIN 4.7 01/24/2020 1810   AST 25 01/24/2020 1810   ALT 37 01/24/2020 1810   ALKPHOS 71 01/24/2020 1810   BILITOT 0.5 01/24/2020 1810   GFRNONAA >60 01/28/2020 0524   GFRAA >60 10/07/2015 0650   Lipase     Component Value Date/Time   LIPASE 19 01/24/2020 1810        Studies/Results: DG Abd Portable 1V  Result Date: 01/27/2020 CLINICAL DATA:  Check gastric catheter placement EXAM: PORTABLE ABDOMEN - 1 VIEW COMPARISON:  None. FINDINGS: Gastric catheter is noted within the stomach. Free air is noted. Scattered large and small bowel gas is noted. IMPRESSION: Gastric catheter within the stomach. Electronically Signed   By: Inez Catalina M.D.   On: 01/27/2020 23:24   DG Abd Portable 1V  Result Date: 01/27/2020 CLINICAL DATA:  Small-bowel obstruction. EXAM: PORTABLE ABDOMEN - 1 VIEW COMPARISON:  01/25/2020.  CT 01/24/2020. FINDINGS: NG tube noted stomach. Surgical clips right upper quadrant. Persistent dilated loop of small bowel noted. Stool noted in the colon. No free air identified. Degenerative change lumbar spine. IMPRESSION: NG tube noted the stomach. Persistent dilated loop of small bowel noted. Stool noted in the colon. Reference is made to CT report 01/24/2020. Electronically Signed   By: Marcello Moores  Register   On: 01/27/2020 06:19    Anti-infectives: Anti-infectives (From admission, onward)   None       Assessment/Plan Hypertension Type II diabetes Asthma Morbid obesity BMI 47.8 Reported sleep apnea  SBO Constipation  Hx of rectal Ca with APR 2017 [facility] hospital - film yesterday AM with persistent dilated loop of small bowel and stool in colon  - has had increased stool output from stoma overnight - clamp NGT and allow CLD and  ensure, if tolerating this will remove NGT this afternoon - ok to start some colace/miralax today  - mobilize more today  - recommend keeping K >4.0 and Mg >2.0  FEN: NGT clamping, CLD and ensure ID: None DVT: Lovenox Follow up: Hoping she can return to [facility]  LOS: 4 days    Norm Parcel , [facility] 01/28/2020, 8:53 AM Please see Amion for pager number during day hours 7:00am-4:30pm

## 2020-01-28 NOTE — Progress Notes (Signed)
PROGRESS NOTE    Cindy Peterson  OFB:[id number] DOB: 1965-04-22 DOA: 01/24/2020 PCP: Karlene Einstein, MD    Brief Narrative:  54 year old female with history of colon cancer and prior colectomy with colostomy and parastomal hernia, recurrent large bowel obstruction presented to the emergency room with abdominal pain and poor output from ostomy. Admitted to hospital for treatment and management followed by surgery. Patient is admitted to hospitalist service, however managed by surgery.   Assessment & Plan:   Principal Problem:   Small bowel obstruction (HCC) Active Problems:   Parastomal hernia   Essential hypertension   Acid reflux   Morbid obesity with BMI of 45.0-49.9, adult (HCC)   PCOS (polycystic ovarian syndrome)   Primary insomnia   Colostomy in place [facility])   Constipation, chronic  Partial small bowel obstruction/parastomal hernia/ileus with constipation: Followed by surgery. Anticipating conservative management. Some clinical improvement today.  NG clamping trial and advancing diet. adequate pain medications and mobility IV fluid. Electrolyte replacement.  Potassium magnesium adequate.  Essential hypertension: Blood pressure is stable. On as needed medications. Unable to take by mouth.  Will resume oral antihypertensives once she is able to have NG tube out.  Type 2 diabetes: On Metformin at home. Fairly controlled.  On sliding scale.  Resume Metformin on discharge.  A1c 6.2.   DVT prophylaxis: enoxaparin (LOVENOX) injection 40 mg Start: 01/25/20 0600   Code Status: Full code Family Communication: None Disposition Plan: Status is: Inpatient  Remains inpatient appropriate because:IV treatments appropriate due to intensity of illness or inability to take PO and Inpatient level of care appropriate due to severity of illness   Dispo: The patient is from: Home              Anticipated d/c is to: Home              Anticipated d/c date is: When stable from surgical  standpoint.              Patient currently is not medically stable to d/c.  I have requested surgery to take over the patient care as she has very minimal medical issues to manage.   Consultants:   Surgery  Procedures:   None  Antimicrobials:   None   Subjective: Seen and examined.  Anxious about slow recovery.  Has some back pain otherwise no issues.  She does not feel nausea vomiting.  NG tube was clamped.  She had some stool in her colostomy. Objective: Vitals:   01/27/20 2029 01/27/20 2100 01/28/20 0134 01/28/20 0635  BP: (!) 142/77  (!) 161/91 129/75  Pulse: 71  62 79  Resp: 18  18 18   Temp: 99.6 F (37.6 C)  98.3 F (36.8 C) 98.3 F (36.8 C)  TempSrc: Oral  Oral Oral  SpO2: 95% 100% 98% 100%  Weight:      Height:        Intake/Output Summary (Last 24 hours) at 01/28/2020 1130 Last data filed at 01/28/2020 0854 Gross per 24 hour  Intake 2243.41 ml  Output 1000 ml  Net 1243.41 ml   Filed Weights   01/24/20 1655  Weight: 122.5 kg    Examination:  General exam: Sitting comfortably in chair.  NG tube clamped. Respiratory system: Clear to auscultation. Respiratory effort normal.  No added sounds. Cardiovascular system: S1 & S2 heard, RRR.  Gastrointestinal system: Abdomen is large and pendulous. Nontender. Left lower quadrant colostomy with minimum loose stool present. Central nervous system: Alert and oriented. No focal  neurological deficits. Extremities: Symmetric 5 x 5 power. Skin: No rashes, lesions or ulcers Psychiatry: Judgement and insight appear normal. Mood & affect appropriate.     Data Reviewed: I have personally reviewed following labs and imaging studies  CBC: Recent Labs  Lab 01/24/20 1845  WBC 7.0  HGB 12.1  HCT 37.3  MCV 83.3  PLT 811   Basic Metabolic Panel: Recent Labs  Lab 01/24/20 1810 01/25/20 0612 01/26/20 1125 01/27/20 0536 01/28/20 0524  NA 134* 137 138 138 137  K 4.2 3.8 3.5 3.8 3.7  CL 99 100 99 99 96*  CO2  23 25 28 28 28   GLUCOSE 154* 145* 125* 110* 114*  BUN 13 11 13 11 11   CREATININE 0.67 0.61 0.62 0.61 0.62  CALCIUM 9.3 9.0 8.6* 8.7* 8.5*  MG  --   --  2.1  --  2.1   GFR: Estimated Creatinine Clearance: 104.4 mL/min (by C-G formula based on SCr of 0.62 mg/dL). Liver Function Tests: Recent Labs  Lab 01/24/20 1810  AST 25  ALT 37  ALKPHOS 71  BILITOT 0.5  PROT 8.8*  ALBUMIN 4.7   Recent Labs  Lab 01/24/20 1810  LIPASE 19   No results for input(s): AMMONIA in the last 168 hours. Coagulation Profile: No results for input(s): INR, PROTIME in the last 168 hours. Cardiac Enzymes: No results for input(s): CKTOTAL, CKMB, CKMBINDEX, TROPONINI in the last 168 hours. BNP (last 3 results) No results for input(s): PROBNP in the last 8760 hours. HbA1C: No results for input(s): HGBA1C in the last 72 hours. CBG: Recent Labs  Lab 01/27/20 1612 01/27/20 1949 01/28/20 0015 01/28/20 0431 01/28/20 0731  GLUCAP 93 94 97 99 112*   Lipid Profile: No results for input(s): CHOL, HDL, LDLCALC, TRIG, CHOLHDL, LDLDIRECT in the last 72 hours. Thyroid Function Tests: No results for input(s): TSH, T4TOTAL, FREET4, T3FREE, THYROIDAB in the last 72 hours. Anemia Panel: No results for input(s): VITAMINB12, FOLATE, FERRITIN, TIBC, IRON, RETICCTPCT in the last 72 hours. Sepsis Labs: Recent Labs  Lab 01/24/20 1810 01/24/20 1957 01/25/20 0922  LATICACIDVEN 2.1* 2.7* 1.9    Recent Results (from the past 240 hour(s))  Resp Panel by RT-PCR (Flu A&B, Covid) Nasopharyngeal Swab     Status: None   Collection Time: 01/24/20  7:24 PM   Specimen: Nasopharyngeal Swab; Nasopharyngeal(NP) swabs in vial transport medium  Result Value Ref Range Status   SARS Coronavirus 2 by RT PCR NEGATIVE NEGATIVE Final    Comment: (NOTE) SARS-CoV-2 target nucleic acids are NOT DETECTED.  The SARS-CoV-2 RNA is generally detectable in upper respiratory specimens during the acute phase of infection. The  lowest concentration of SARS-CoV-2 viral copies this assay can detect is 138 copies/mL. A negative result does not preclude SARS-Cov-2 infection and should not be used as the sole basis for treatment or other patient management decisions. A negative result may occur with  improper specimen collection/handling, submission of specimen other than nasopharyngeal swab, presence of viral mutation(s) within the areas targeted by this assay, and inadequate number of viral copies(<138 copies/mL). A negative result must be combined with clinical observations, patient history, and epidemiological information. The expected result is Negative.  Fact Sheet for Patients:  EntrepreneurPulse.com.au  Fact Sheet for Healthcare Providers:  IncredibleEmployment.be  This test is no t yet approved or cleared by the [place] FDA and  has been authorized for detection and/or diagnosis of SARS-CoV-2 by FDA under an Emergency Use Authorization (EUA). This EUA will remain  in effect (meaning this test can be used) for the duration of the COVID-19 declaration under Section 564(b)(1) of the Act, 21 U.S.C.section 360bbb-3(b)(1), unless the authorization is terminated  or revoked sooner.       Influenza A by PCR NEGATIVE NEGATIVE Final   Influenza B by PCR NEGATIVE NEGATIVE Final    Comment: (NOTE) The Xpert Xpress SARS-CoV-2/FLU/RSV plus assay is intended as an aid in the diagnosis of influenza from Nasopharyngeal swab specimens and should not be used as a sole basis for treatment. Nasal washings and aspirates are unacceptable for Xpert Xpress SARS-CoV-2/FLU/RSV testing.  Fact Sheet for Patients: EntrepreneurPulse.com.au  Fact Sheet for Healthcare Providers: IncredibleEmployment.be  This test is not yet approved or cleared by the [place] FDA and has been authorized for detection and/or diagnosis of SARS-CoV-2 by FDA under  an Emergency Use Authorization (EUA). This EUA will remain in effect (meaning this test can be used) for the duration of the COVID-19 declaration under Section 564(b)(1) of the Act, 21 U.S.C. section 360bbb-3(b)(1), unless the authorization is terminated or revoked.  Performed at [facility], [address], [place], [place] [address]          Radiology Studies: DG Abd Portable 1V  Result Date: 01/27/2020 CLINICAL DATA:  Check gastric catheter placement EXAM: PORTABLE ABDOMEN - 1 VIEW COMPARISON:  None. FINDINGS: Gastric catheter is noted within the stomach. Free air is noted. Scattered large and small bowel gas is noted. IMPRESSION: Gastric catheter within the stomach. Electronically Signed   By: Inez Catalina M.D.   On: 01/27/2020 23:24   DG Abd Portable 1V  Result Date: 01/27/2020 CLINICAL DATA:  Small-bowel obstruction. EXAM: PORTABLE ABDOMEN - 1 VIEW COMPARISON:  01/25/2020.  CT 01/24/2020. FINDINGS: NG tube noted stomach. Surgical clips right upper quadrant. Persistent dilated loop of small bowel noted. Stool noted in the colon. No free air identified. Degenerative change lumbar spine. IMPRESSION: NG tube noted the stomach. Persistent dilated loop of small bowel noted. Stool noted in the colon. Reference is made to CT report 01/24/2020. Electronically Signed   By: Marcello Moores  Register   On: 01/27/2020 06:19        Scheduled Meds: . enoxaparin (LOVENOX) injection  40 mg Subcutaneous Q24H  . feeding supplement (GLUCERNA SHAKE)  237 mL Oral TID BM  . insulin aspart  0-9 Units Subcutaneous Q4H  . polyethylene glycol  17 g Oral Daily   Continuous Infusions: . lactated ringers 125 mL/hr at 01/28/20 0041  . methocarbamol (ROBAXIN) IV       LOS: 4 days    Time spent: 18 minutes    Barb Merino, MD [facility] Pager [phone]

## 2020-01-29 LAB — GLUCOSE, CAPILLARY
Glucose-Capillary: 63 mg/dL — ABNORMAL LOW (ref 70–99)
Glucose-Capillary: 74 mg/dL (ref 70–99)
Glucose-Capillary: 82 mg/dL (ref 70–99)
Glucose-Capillary: 91 mg/dL (ref 70–99)
Glucose-Capillary: 97 mg/dL (ref 70–99)
Glucose-Capillary: 99 mg/dL (ref 70–99)

## 2020-01-29 MED ORDER — PHENOL 1.4 % MT LIQD
1.0000 | OROMUCOSAL | Status: DC | PRN
  Administered 2020-01-29: 1 via OROMUCOSAL
  Filled 2020-01-29: qty 177

## 2020-01-29 NOTE — Progress Notes (Signed)
PROGRESS NOTE    Cindy Peterson  PZW:[id number] DOB: 12/31/1965 DOA: 01/24/2020 PCP: Karlene Einstein, MD    Brief Narrative:  54 year old female with history of colon cancer and prior colectomy with colostomy and parastomal hernia, recurrent large bowel obstruction presented to the emergency room with abdominal pain and poor output from ostomy. Admitted to hospital for treatment and management followed by surgery. Patient is admitted to hospitalist service, managed by surgery.   Assessment & Plan:   Principal Problem:   Small bowel obstruction (HCC) Active Problems:   Parastomal hernia   Essential hypertension   Acid reflux   Morbid obesity with BMI of 45.0-49.9, adult (HCC)   PCOS (polycystic ovarian syndrome)   Primary insomnia   Colostomy in place [facility])   Constipation, chronic  Partial small bowel obstruction/parastomal hernia/ileus with constipation: Followed by surgery. Anticipating conservative management. Some clinical improvement today.  Clamping NG trial today.  Full liquid diet. IV fluid. Electrolyte replacement.  Potassium magnesium adequate. Ordered electrolytes magnesium and phosphorus in the morning labs.  Essential hypertension: Blood pressure is stable. On as needed medications. Unable to take by mouth.  Will resume oral antihypertensives once she is able to have NG tube out.  Type 2 diabetes: On Metformin at home. Fairly controlled.  On sliding scale.  Resume Metformin on discharge.  A1c 6.2.   DVT prophylaxis: enoxaparin (LOVENOX) injection 40 mg Start: 01/25/20 0600   Code Status: Full code Family Communication: None Disposition Plan: Status is: Inpatient  Remains inpatient appropriate because:IV treatments appropriate due to intensity of illness or inability to take PO and Inpatient level of care appropriate due to severity of illness   Dispo: The patient is from: Home              Anticipated d/c is to: Home              Anticipated d/c date is:  When stable from surgical standpoint.              Patient currently is not medically stable to d/c.   Consultants:   Surgery  Procedures:   None  Antimicrobials:   None   Subjective: Patient seen and examined.  Denies any abdominal pain.  She is very anxious about not getting adequate improvement.  Denies any nausea vomiting.  She had 600 mL on her NG tube overnight.  Has some stool in the stoma.  Objective: Vitals:   01/28/20 1325 01/28/20 2119 01/28/20 2353 01/29/20 0535  BP: (!) 160/96 (!) 170/87 (!) 159/83 (!) 147/77  Pulse: 69 86 74 77  Resp: 17 16  16   Temp:  98.1 F (36.7 C)  99 F (37.2 C)  TempSrc:    Oral  SpO2: 99% 100%  100%  Weight:      Height:        Intake/Output Summary (Last 24 hours) at 01/29/2020 1127 Last data filed at 01/29/2020 0600 Gross per 24 hour  Intake 1099.17 ml  Output 1000 ml  Net 99.17 ml   Filed Weights   01/24/20 1655  Weight: 122.5 kg    Examination:  General exam: lying in bed. Anxious . NG tube clamped. Respiratory system: Clear to auscultation. Respiratory effort normal.  No added sounds. Cardiovascular system: S1 & S2 heard, RRR.  Gastrointestinal system: Abdomen is large and pendulous. Nontender. Left lower quadrant colostomy with loose stool. Central nervous system: Alert and oriented. No focal neurological deficits. Extremities: Symmetric 5 x 5 power. Skin: No rashes, lesions  or ulcers Psychiatry: Judgement and insight appear normal. Mood & affect appropriate.     Data Reviewed: I have personally reviewed following labs and imaging studies  CBC: Recent Labs  Lab 01/24/20 1845  WBC 7.0  HGB 12.1  HCT 37.3  MCV 83.3  PLT 329   Basic Metabolic Panel: Recent Labs  Lab 01/24/20 1810 01/25/20 0612 01/26/20 1125 01/27/20 0536 01/28/20 0524  NA 134* 137 138 138 137  K 4.2 3.8 3.5 3.8 3.7  CL 99 100 99 99 96*  CO2 23 25 28 28 28   GLUCOSE 154* 145* 125* 110* 114*  BUN 13 11 13 11 11   CREATININE 0.67  0.61 0.62 0.61 0.62  CALCIUM 9.3 9.0 8.6* 8.7* 8.5*  MG  --   --  2.1  --  2.1   GFR: Estimated Creatinine Clearance: 104.4 mL/min (by C-G formula based on SCr of 0.62 mg/dL). Liver Function Tests: Recent Labs  Lab 01/24/20 1810  AST 25  ALT 37  ALKPHOS 71  BILITOT 0.5  PROT 8.8*  ALBUMIN 4.7   Recent Labs  Lab 01/24/20 1810  LIPASE 19   No results for input(s): AMMONIA in the last 168 hours. Coagulation Profile: No results for input(s): INR, PROTIME in the last 168 hours. Cardiac Enzymes: No results for input(s): CKTOTAL, CKMB, CKMBINDEX, TROPONINI in the last 168 hours. BNP (last 3 results) No results for input(s): PROBNP in the last 8760 hours. HbA1C: No results for input(s): HGBA1C in the last 72 hours. CBG: Recent Labs  Lab 01/28/20 1526 01/28/20 1939 01/28/20 2332 01/29/20 0451 01/29/20 0726  GLUCAP 73 77 81 74 82   Lipid Profile: No results for input(s): CHOL, HDL, LDLCALC, TRIG, CHOLHDL, LDLDIRECT in the last 72 hours. Thyroid Function Tests: No results for input(s): TSH, T4TOTAL, FREET4, T3FREE, THYROIDAB in the last 72 hours. Anemia Panel: No results for input(s): VITAMINB12, FOLATE, FERRITIN, TIBC, IRON, RETICCTPCT in the last 72 hours. Sepsis Labs: Recent Labs  Lab 01/24/20 1810 01/24/20 1957 01/25/20 0922  LATICACIDVEN 2.1* 2.7* 1.9    Recent Results (from the past 240 hour(s))  Resp Panel by RT-PCR (Flu A&B, Covid) Nasopharyngeal Swab     Status: None   Collection Time: 01/24/20  7:24 PM   Specimen: Nasopharyngeal Swab; Nasopharyngeal(NP) swabs in vial transport medium  Result Value Ref Range Status   SARS Coronavirus 2 by RT PCR NEGATIVE NEGATIVE Final    Comment: (NOTE) SARS-CoV-2 target nucleic acids are NOT DETECTED.  The SARS-CoV-2 RNA is generally detectable in upper respiratory specimens during the acute phase of infection. The lowest concentration of SARS-CoV-2 viral copies this assay can detect is 138 copies/mL. A negative  result does not preclude SARS-Cov-2 infection and should not be used as the sole basis for treatment or other patient management decisions. A negative result may occur with  improper specimen collection/handling, submission of specimen other than nasopharyngeal swab, presence of viral mutation(s) within the areas targeted by this assay, and inadequate number of viral copies(<138 copies/mL). A negative result must be combined with clinical observations, patient history, and epidemiological information. The expected result is Negative.  Fact Sheet for Patients:  EntrepreneurPulse.com.au  Fact Sheet for Healthcare Providers:  IncredibleEmployment.be  This test is no t yet approved or cleared by the [place] FDA and  has been authorized for detection and/or diagnosis of SARS-CoV-2 by FDA under an Emergency Use Authorization (EUA). This EUA will remain  in effect (meaning this test can be used) for the duration  of the COVID-19 declaration under Section 564(b)(1) of the Act, 21 U.S.C.section 360bbb-3(b)(1), unless the authorization is terminated  or revoked sooner.       Influenza A by PCR NEGATIVE NEGATIVE Final   Influenza B by PCR NEGATIVE NEGATIVE Final    Comment: (NOTE) The Xpert Xpress SARS-CoV-2/FLU/RSV plus assay is intended as an aid in the diagnosis of influenza from Nasopharyngeal swab specimens and should not be used as a sole basis for treatment. Nasal washings and aspirates are unacceptable for Xpert Xpress SARS-CoV-2/FLU/RSV testing.  Fact Sheet for Patients: EntrepreneurPulse.com.au  Fact Sheet for Healthcare Providers: IncredibleEmployment.be  This test is not yet approved or cleared by the [place] FDA and has been authorized for detection and/or diagnosis of SARS-CoV-2 by FDA under an Emergency Use Authorization (EUA). This EUA will remain in effect (meaning this test can be used)  for the duration of the COVID-19 declaration under Section 564(b)(1) of the Act, 21 U.S.C. section 360bbb-3(b)(1), unless the authorization is terminated or revoked.  Performed at [facility], [address] [place], [place] [address]          Radiology Studies: DG Abd Portable 1V  Result Date: 01/27/2020 CLINICAL DATA:  Check gastric catheter placement EXAM: PORTABLE ABDOMEN - 1 VIEW COMPARISON:  None. FINDINGS: Gastric catheter is noted within the stomach. Free air is noted. Scattered large and small bowel gas is noted. IMPRESSION: Gastric catheter within the stomach. Electronically Signed   By: Inez Catalina M.D.   On: 01/27/2020 23:24        Scheduled Meds: . enoxaparin (LOVENOX) injection  40 mg Subcutaneous Q24H  . feeding supplement (GLUCERNA SHAKE)  237 mL Oral TID BM  . insulin aspart  0-9 Units Subcutaneous Q4H  . polyethylene glycol  17 g Oral Daily   Continuous Infusions: . lactated ringers 50 mL/hr at 01/29/20 0330  . methocarbamol (ROBAXIN) IV 500 mg (01/28/20 1404)     LOS: 5 days    Time spent: 20 minutes    Barb Merino, MD [facility] Pager [phone]

## 2020-01-29 NOTE — Progress Notes (Signed)
     Assessment & Plan: SBO Constipation  Hx of rectal Ca with APR - [place] NG tube this AM  Begin full liquid diet  Encouraged OOB, ambulation in halls        [name], MD       [facility]       Office: [phone]   Chief Complaint: SBO, constipation  Subjective: Patient in bed, no complaints.  Wants to eat.  Passing flatus, stool in ostomy bag.  Objective: Vital signs in last 24 hours: Temp:  [98.1 F (36.7 C)-99 F (37.2 C)] 99 F (37.2 C) (12/04 0535) Pulse Rate:  [69-86] 77 (12/04 0535) Resp:  [16-17] 16 (12/04 0535) BP: (147-170)/(77-96) 147/77 (12/04 0535) SpO2:  [99 %-100 %] 100 % (12/04 0535) Last BM Date: 01/28/20  Intake/Output from previous day: 12/03 0701 - 12/04 0700 In: 1099.2 [P.O.:150; I.V.:899.2; IV Piggyback:50] Out: 1100 [Emesis/NG output:1100] Intake/Output this shift: No intake/output data recorded.  Physical Exam: HEENT - sclerae clear, mucous membranes moist Neck - soft Abdomen - soft, obese; non-tender; gas and stool in ostomy bag Neuro - alert & oriented  Lab Results:  No results for input(s): WBC, HGB, HCT, PLT in the last 72 hours. BMET Recent Labs    01/27/20 0536 01/28/20 0524  NA 138 137  K 3.8 3.7  CL 99 96*  CO2 28 28  GLUCOSE 110* 114*  BUN 11 11  CREATININE 0.61 0.62  CALCIUM 8.7* 8.5*   PT/INR No results for input(s): LABPROT, INR in the last 72 hours. Comprehensive Metabolic Panel:    Component Value Date/Time   NA 137 01/28/2020 0524   NA 138 01/27/2020 0536   K 3.7 01/28/2020 0524   K 3.8 01/27/2020 0536   CL 96 (L) 01/28/2020 0524   CL 99 01/27/2020 0536   CO2 28 01/28/2020 0524   CO2 28 01/27/2020 0536   BUN 11 01/28/2020 0524   BUN 11 01/27/2020 0536   CREATININE 0.62 01/28/2020 0524   CREATININE 0.61 01/27/2020 0536   GLUCOSE 114 (H) 01/28/2020 0524   GLUCOSE 110 (H) 01/27/2020 0536   CALCIUM 8.5 (L) 01/28/2020 0524   CALCIUM 8.7 (L)  01/27/2020 0536   AST 25 01/24/2020 1810   AST 29 10/07/2015 0650   ALT 37 01/24/2020 1810   ALT 34 10/07/2015 0650   ALKPHOS 71 01/24/2020 1810   ALKPHOS 74 10/07/2015 0650   BILITOT 0.5 01/24/2020 1810   BILITOT 0.8 10/07/2015 0650   PROT 8.8 (H) 01/24/2020 1810   PROT 8.3 (H) 10/07/2015 0650   ALBUMIN 4.7 01/24/2020 1810   ALBUMIN 4.3 10/07/2015 0650    Studies/Results: DG Abd Portable 1V  Result Date: 01/27/2020 CLINICAL DATA:  Check gastric catheter placement EXAM: PORTABLE ABDOMEN - 1 VIEW COMPARISON:  None. FINDINGS: Gastric catheter is noted within the stomach. Free air is noted. Scattered large and small bowel gas is noted. IMPRESSION: Gastric catheter within the stomach. Electronically Signed   By: Inez Catalina M.D.   On: 01/27/2020 23:24      [name] 01/29/2020  Patient ID: Cindy Peterson, female   DOB: August 11, 1965, 54 y.o.   MRN: [id number]

## 2020-01-29 NOTE — Progress Notes (Signed)
Recently medicated for nausea. Per order unclamped NGT, put it to LIWS and waited 10- 15 mins. No residual noted. Pt in bed now and reports feeling better. NGT clamped. Next shift aware.

## 2020-01-30 ENCOUNTER — Inpatient Hospital Stay (HOSPITAL_COMMUNITY): Payer: BC Managed Care – PPO

## 2020-01-30 ENCOUNTER — Encounter (HOSPITAL_COMMUNITY): Payer: Self-pay | Admitting: Family Medicine

## 2020-01-30 DIAGNOSIS — K219 Gastro-esophageal reflux disease without esophagitis: Secondary | ICD-10-CM

## 2020-01-30 DIAGNOSIS — Z933 Colostomy status: Secondary | ICD-10-CM

## 2020-01-30 DIAGNOSIS — Z6841 Body Mass Index (BMI) 40.0 and over, adult: Secondary | ICD-10-CM

## 2020-01-30 LAB — CBC WITH DIFFERENTIAL/PLATELET
Abs Immature Granulocytes: 0.07 10*3/uL (ref 0.00–0.07)
Basophils Absolute: 0 10*3/uL (ref 0.0–0.1)
Basophils Relative: 0 %
Eosinophils Absolute: 0 10*3/uL (ref 0.0–0.5)
Eosinophils Relative: 0 %
HCT: 38 % (ref 36.0–46.0)
Hemoglobin: 12.1 g/dL (ref 12.0–15.0)
Immature Granulocytes: 1 %
Lymphocytes Relative: 16 %
Lymphs Abs: 1.2 10*3/uL (ref 0.7–4.0)
MCH: 26.8 pg (ref 26.0–34.0)
MCHC: 31.8 g/dL (ref 30.0–36.0)
MCV: 84.3 fL (ref 80.0–100.0)
Monocytes Absolute: 0.7 10*3/uL (ref 0.1–1.0)
Monocytes Relative: 9 %
Neutro Abs: 5.6 10*3/uL (ref 1.7–7.7)
Neutrophils Relative %: 74 %
Platelets: 280 10*3/uL (ref 150–400)
RBC: 4.51 MIL/uL (ref 3.87–5.11)
RDW: 14.6 % (ref 11.5–15.5)
WBC: 7.5 10*3/uL (ref 4.0–10.5)
nRBC: 0 % (ref 0.0–0.2)

## 2020-01-30 LAB — COMPREHENSIVE METABOLIC PANEL
ALT: 62 U/L — ABNORMAL HIGH (ref 0–44)
AST: 37 U/L (ref 15–41)
Albumin: 3.8 g/dL (ref 3.5–5.0)
Alkaline Phosphatase: 82 U/L (ref 38–126)
Anion gap: 14 (ref 5–15)
BUN: 10 mg/dL (ref 6–20)
CO2: 30 mmol/L (ref 22–32)
Calcium: 8.4 mg/dL — ABNORMAL LOW (ref 8.9–10.3)
Chloride: 90 mmol/L — ABNORMAL LOW (ref 98–111)
Creatinine, Ser: 0.58 mg/dL (ref 0.44–1.00)
GFR, Estimated: >60 mL/min (ref >60)
Glucose, Bld: 96 mg/dL (ref 70–99)
Potassium: 3 mmol/L — ABNORMAL LOW (ref 3.5–5.1)
Sodium: 134 mmol/L — ABNORMAL LOW (ref 135–145)
Total Bilirubin: 1.3 mg/dL — ABNORMAL HIGH (ref 0.3–1.2)
Total Protein: 7.4 g/dL (ref 6.5–8.1)

## 2020-01-30 LAB — MAGNESIUM: Magnesium: 2.3 mg/dL (ref 1.7–2.4)

## 2020-01-30 LAB — GLUCOSE, CAPILLARY
Glucose-Capillary: 107 mg/dL — ABNORMAL HIGH (ref 70–99)
Glucose-Capillary: 90 mg/dL (ref 70–99)
Glucose-Capillary: 93 mg/dL (ref 70–99)
Glucose-Capillary: 63 mg/dL — ABNORMAL LOW (ref 70–99)
Glucose-Capillary: 113 mg/dL — ABNORMAL HIGH (ref 70–99)
Glucose-Capillary: 55 mg/dL — ABNORMAL LOW (ref 70–99)
Glucose-Capillary: 97 mg/dL (ref 70–99)

## 2020-01-30 LAB — PHOSPHORUS: Phosphorus: 3 mg/dL (ref 2.5–4.6)

## 2020-01-30 MED ORDER — POTASSIUM CHLORIDE 10 MEQ/100ML IV SOLN
10.0000 meq | INTRAVENOUS | Status: AC
  Administered 2020-01-30 (×3): 10 meq via INTRAVENOUS
  Filled 2020-01-30 (×3): qty 100

## 2020-01-30 MED ORDER — DEXTROSE 50 % IV SOLN
12.5000 g | INTRAVENOUS | Status: AC
  Administered 2020-01-30: 12.5 g via INTRAVENOUS

## 2020-01-30 MED ORDER — DEXTROSE 50 % IV SOLN
INTRAVENOUS | Status: AC
  Filled 2020-01-30: qty 50

## 2020-01-30 MED ORDER — POTASSIUM CHLORIDE 10 MEQ/100ML IV SOLN
10.0000 meq | INTRAVENOUS | Status: AC
  Administered 2020-01-30 (×5): 10 meq via INTRAVENOUS
  Filled 2020-01-30 (×5): qty 100

## 2020-01-30 MED ORDER — DEXTROSE 50 % IV SOLN
12.5000 g | INTRAVENOUS | Status: AC
  Administered 2020-01-30: 12.5 g via INTRAVENOUS
  Filled 2020-01-30: qty 50

## 2020-01-30 NOTE — Progress Notes (Addendum)
Hypoglycemic Event  CBG: 55  Treatment: 25 ml D50  Symptoms: None  Follow-up CBG: Time:2050 CBG Result:97  Possible Reasons for Event: NPO  Comments/MD notified:Hypoglycemia standing orders    Cindy Peterson

## 2020-01-30 NOTE — Progress Notes (Signed)
     Assessment & Plan:  SBO Constipation  Hx of rectal Ca with APR - 2017 [facility]             Nausea with full liquid diet last night and this AM - NG back to suction per nursing  NPO, ice chips, sips water             Encouraged OOB, ambulation in halls  Will check 2 view AXR this AM  May require operative intervention if no significant progress.        [name], MD       [facility]       Office: [phone]   Chief Complaint: SBO, parastomal hernia  Subjective: Patient in bed, developed nausea last PM.  NG to suction.  NPO.  No significant output in ostomy.  Objective: Vital signs in last 24 hours: Temp:  [97.8 F (36.6 C)-98.6 F (37 C)] 98.3 F (36.8 C) (12/05 0550) Pulse Rate:  [67-72] 72 (12/05 0550) Resp:  [17-18] 18 (12/05 0550) BP: (121-143)/(63-84) 134/63 (12/05 0550) SpO2:  [96 %-100 %] 97 % (12/05 0550) Last BM Date: 01/29/20  Intake/Output from previous day: 12/04 0701 - 12/05 0700 In: 1415.4 [P.O.:420; I.V.:995.4] Out: 0  Intake/Output this shift: No intake/output data recorded.  Physical Exam: HEENT - sclerae clear, mucous membranes moist Abdomen - soft, obese; ostomy viable, minimal output; parastomal hernia soft, minimally tender Ext - no edema, non-tender Neuro - alert & oriented, no focal deficits  Lab Results:  Recent Labs    01/30/20 0603  WBC 7.5  HGB 12.1  HCT 38.0  PLT 280   BMET Recent Labs    01/28/20 0524 01/30/20 0603  NA 137 134*  K 3.7 3.0*  CL 96* 90*  CO2 28 30  GLUCOSE 114* 96  BUN 11 10  CREATININE 0.62 0.58  CALCIUM 8.5* 8.4*   PT/INR No results for input(s): LABPROT, INR in the last 72 hours. Comprehensive Metabolic Panel:    Component Value Date/Time   NA 134 (L) 01/30/2020 0603   NA 137 01/28/2020 0524   K 3.0 (L) 01/30/2020 0603   K 3.7 01/28/2020 0524   CL 90 (L) 01/30/2020 0603   CL 96 (L) 01/28/2020 0524   CO2 30 01/30/2020 0603   CO2 28 01/28/2020 0524    BUN 10 01/30/2020 0603   BUN 11 01/28/2020 0524   CREATININE 0.58 01/30/2020 0603   CREATININE 0.62 01/28/2020 0524   GLUCOSE 96 01/30/2020 0603   GLUCOSE 114 (H) 01/28/2020 0524   CALCIUM 8.4 (L) 01/30/2020 0603   CALCIUM 8.5 (L) 01/28/2020 0524   AST 37 01/30/2020 0603   AST 25 01/24/2020 1810   ALT 62 (H) 01/30/2020 0603   ALT 37 01/24/2020 1810   ALKPHOS 82 01/30/2020 0603   ALKPHOS 71 01/24/2020 1810   BILITOT 1.3 (H) 01/30/2020 0603   BILITOT 0.5 01/24/2020 1810   PROT 7.4 01/30/2020 0603   PROT 8.8 (H) 01/24/2020 1810   ALBUMIN 3.8 01/30/2020 0603   ALBUMIN 4.7 01/24/2020 1810    Studies/Results: No results found.    [name] 01/30/2020  Patient ID: Cindy Peterson, female   DOB: 1965-06-29, 54 y.o.   MRN: [id number]

## 2020-01-30 NOTE — Progress Notes (Signed)
PROGRESS NOTE    Cindy Peterson  HYQ:[id number] DOB: 09-14-1965 DOA: 01/24/2020 PCP: Karlene Einstein, MD    Brief Narrative:   Patient is a 54 years old female with past medical history of colon cancer status post colectomy, colostomy and parastomal hernia, recurrent large bowel obstruction presented to the emergency room with abdominal pain and poor output from ostomy.  Patient was then admitted to hospital for treatment of bowel obstruction.    Assessment & Plan:   Principal Problem:   Small bowel obstruction (HCC) Active Problems:   Parastomal hernia   Essential hypertension   Acid reflux   Morbid obesity with BMI of 45.0-49.9, adult (HCC)   PCOS (polycystic ovarian syndrome)   Primary insomnia   Colostomy in place [facility])   Constipation, chronic  Partial small bowel obstruction/parastomal hernia/ileus with constipation: Surgery on board.  Currently on conservative treatment.  NG tube trial was done yesterday and patient was put on full liquids.  Patient is nauseated.  Has been changed to n.p.o. except sips of water.  Follow abdominal xray in am.  Surgery to follow the patient .  Magnesium of 2.3.  Potassium of 3.0.  Will give 40 mEq of potassium through IV.  Check BMP in a.m.  Essential hypertension: Currently NPO.  Continue antihypertensive medication through IV route.  Closely monitor.  Type 2 diabetes mellitus: On Metformin at home.  Continue sliding scale insulin, Accu-Cheks.  Latest A1c of 6.2.     DVT prophylaxis: enoxaparin (LOVENOX) injection 40 mg Start: 01/25/20 0600   Code Status: Full code  Family Communication: None  Disposition Plan: Status is: Inpatient  Remains inpatient appropriate because:IV treatments appropriate due to intensity of illness or inability to take PO and Inpatient level of care appropriate due to severity of illness, bowel obstruction,   Dispo: The patient is from: Home              Anticipated d/c is to: Home              Anticipated  d/c date is: When stable from surgical standpoint.              Patient currently is not medically stable to d/c.   Consultants:   Surgery  Procedures:   None  Antimicrobials:   None   Subjective: Today, patient was seen and examined at bedside.  Patient stated that that she feels nauseous after clamping the NG tube.  He also had abdominal distention and bloating.  After initiating NG tube today she has felt better.  Denies any shortness of breath, cough fevers or chills.  No output noted in the stoma.   Objective: Vitals:   01/29/20 0535 01/29/20 1328 01/29/20 2125 01/30/20 0550  BP: (!) 147/77 (!) 143/84 121/71 134/63  Pulse: 77 67 72 72  Resp: 16 17 18 18   Temp: 99 F (37.2 C) 98.6 F (37 C) 97.8 F (36.6 C) 98.3 F (36.8 C)  TempSrc: Oral Oral Oral Oral  SpO2: 100% 96% 100% 97%  Weight:      Height:        Intake/Output Summary (Last 24 hours) at 01/30/2020 0950 Last data filed at 01/30/2020 0600 Gross per 24 hour  Intake 1415.43 ml  Output 0 ml  Net 1415.43 ml   Filed Weights   01/24/20 1655  Weight: 122.5 kg   Body mass index is 47.83 kg/m.  Physical examination:  General: Morbidly obese built, not in obvious distress, NG tube in place under  intermittent suction HENT:   No scleral pallor or icterus noted. Oral mucosa is moist.  Chest:   Diminished breath sounds bilaterally. No crackles or wheezes.  CVS: S1 &S2 heard. No murmur.  Regular rate and rhythm. Abdomen: Left lower quadrant colostomy with no output, parastomal hernia, mildly distended abdomen, obese, Extremities: No cyanosis, clubbing or edema.  Peripheral pulses are palpable. Psych: Alert, awake and oriented, normal mood CNS:  No cranial nerve deficits.  Power equal in all extremities.   Skin: Warm and dry.  No rashes noted.   Data Reviewed: I have reviewed the labs and imaging studies as below  CBC: Recent Labs  Lab 01/24/20 1845 01/30/20 0603  WBC 7.0 7.5  NEUTROABS  --  5.6   HGB 12.1 12.1  HCT 37.3 38.0  MCV 83.3 84.3  PLT 280 175   Basic Metabolic Panel: Recent Labs  Lab 01/25/20 0612 01/26/20 1125 01/27/20 0536 01/28/20 0524 01/30/20 0603  NA 137 138 138 137 134*  K 3.8 3.5 3.8 3.7 3.0*  CL 100 99 99 96* 90*  CO2 25 28 28 28 30   GLUCOSE 145* 125* 110* 114* 96  BUN 11 13 11 11 10   CREATININE 0.61 0.62 0.61 0.62 0.58  CALCIUM 9.0 8.6* 8.7* 8.5* 8.4*  MG  --  2.1  --  2.1 2.3  PHOS  --   --   --   --  3.0   GFR: Estimated Creatinine Clearance: 104.4 mL/min (by C-G formula based on SCr of 0.58 mg/dL). Liver Function Tests: Recent Labs  Lab 01/24/20 1810 01/30/20 0603  AST 25 37  ALT 37 62*  ALKPHOS 71 82  BILITOT 0.5 1.3*  PROT 8.8* 7.4  ALBUMIN 4.7 3.8   Recent Labs  Lab 01/24/20 1810  LIPASE 19   No results for input(s): AMMONIA in the last 168 hours. Coagulation Profile: No results for input(s): INR, PROTIME in the last 168 hours. Cardiac Enzymes: No results for input(s): CKTOTAL, CKMB, CKMBINDEX, TROPONINI in the last 168 hours. BNP (last 3 results) No results for input(s): PROBNP in the last 8760 hours. HbA1C: No results for input(s): HGBA1C in the last 72 hours. CBG: Recent Labs  Lab 01/29/20 1539 01/29/20 1925 01/29/20 2336 01/30/20 0345 01/30/20 0800  GLUCAP 63* 99 91 107* 90   Lipid Profile: No results for input(s): CHOL, HDL, LDLCALC, TRIG, CHOLHDL, LDLDIRECT in the last 72 hours. Thyroid Function Tests: No results for input(s): TSH, T4TOTAL, FREET4, T3FREE, THYROIDAB in the last 72 hours. Anemia Panel: No results for input(s): VITAMINB12, FOLATE, FERRITIN, TIBC, IRON, RETICCTPCT in the last 72 hours. Sepsis Labs: Recent Labs  Lab 01/24/20 1810 01/24/20 1957 01/25/20 0922  LATICACIDVEN 2.1* 2.7* 1.9    Recent Results (from the past 240 hour(s))  Resp Panel by RT-PCR (Flu A&B, Covid) Nasopharyngeal Swab     Status: None   Collection Time: 01/24/20  7:24 PM   Specimen: Nasopharyngeal Swab;  Nasopharyngeal(NP) swabs in vial transport medium  Result Value Ref Range Status   SARS Coronavirus 2 by RT PCR NEGATIVE NEGATIVE Final    Comment: (NOTE) SARS-CoV-2 target nucleic acids are NOT DETECTED.  The SARS-CoV-2 RNA is generally detectable in upper respiratory specimens during the acute phase of infection. The lowest concentration of SARS-CoV-2 viral copies this assay can detect is 138 copies/mL. A negative result does not preclude SARS-Cov-2 infection and should not be used as the sole basis for treatment or other patient management decisions. A negative result may occur  with  improper specimen collection/handling, submission of specimen other than nasopharyngeal swab, presence of viral mutation(s) within the areas targeted by this assay, and inadequate number of viral copies(<138 copies/mL). A negative result must be combined with clinical observations, patient history, and epidemiological information. The expected result is Negative.  Fact Sheet for Patients:  EntrepreneurPulse.com.au  Fact Sheet for Healthcare Providers:  IncredibleEmployment.be  This test is no t yet approved or cleared by the [place] FDA and  has been authorized for detection and/or diagnosis of SARS-CoV-2 by FDA under an Emergency Use Authorization (EUA). This EUA will remain  in effect (meaning this test can be used) for the duration of the COVID-19 declaration under Section 564(b)(1) of the Act, 21 U.S.C.section 360bbb-3(b)(1), unless the authorization is terminated  or revoked sooner.       Influenza A by PCR NEGATIVE NEGATIVE Final   Influenza B by PCR NEGATIVE NEGATIVE Final    Comment: (NOTE) The Xpert Xpress SARS-CoV-2/FLU/RSV plus assay is intended as an aid in the diagnosis of influenza from Nasopharyngeal swab specimens and should not be used as a sole basis for treatment. Nasal washings and aspirates are unacceptable for Xpert Xpress  SARS-CoV-2/FLU/RSV testing.  Fact Sheet for Patients: EntrepreneurPulse.com.au  Fact Sheet for Healthcare Providers: IncredibleEmployment.be  This test is not yet approved or cleared by the [place] FDA and has been authorized for detection and/or diagnosis of SARS-CoV-2 by FDA under an Emergency Use Authorization (EUA). This EUA will remain in effect (meaning this test can be used) for the duration of the COVID-19 declaration under Section 564(b)(1) of the Act, 21 U.S.C. section 360bbb-3(b)(1), unless the authorization is terminated or revoked.  Performed at [facility], [address], [place], [place] [address]        Radiology Studies: No results found.   Scheduled Meds: . enoxaparin (LOVENOX) injection  40 mg Subcutaneous Q24H  . feeding supplement (GLUCERNA SHAKE)  237 mL Oral TID BM  . insulin aspart  0-9 Units Subcutaneous Q4H  . polyethylene glycol  17 g Oral Daily   Continuous Infusions: . lactated ringers 50 mL/hr at 01/29/20 2341  . methocarbamol (ROBAXIN) IV 500 mg (01/28/20 1404)  . potassium chloride 10 mEq (01/30/20 0934)     LOS: 6 days     Flora Lipps, MD [facility]

## 2020-01-30 NOTE — Progress Notes (Signed)
Back to [facility] with c/o nausea. No vomiting. 100 ml stomach contents immediately returned to cannister. Awaiting Surgeon on unit to round.

## 2020-01-30 NOTE — Plan of Care (Signed)

## 2020-01-30 NOTE — Progress Notes (Signed)
Hypoglycemic Event  CBG: 63   Treatment: D50 25 mL (12.5 gm)  Symptoms: None  Follow-up CBG: Time:1645 CBG Result:113  Possible Reasons for Event: Inadequate meal intake>pt NPO with NGT  Comments/MD notified:Dr. Pokhrel aware. No new orders received.    Derek Jack

## 2020-01-31 ENCOUNTER — Inpatient Hospital Stay (HOSPITAL_COMMUNITY): Payer: BC Managed Care – PPO

## 2020-01-31 ENCOUNTER — Inpatient Hospital Stay (HOSPITAL_COMMUNITY): Payer: BC Managed Care – PPO | Admitting: Anesthesiology

## 2020-01-31 ENCOUNTER — Encounter (HOSPITAL_COMMUNITY)
Admission: EM | Disposition: A | Discharge status: Home / Self Care | Payer: Self-pay | Source: Home / Self Care | Attending: Internal Medicine

## 2020-01-31 ENCOUNTER — Encounter (HOSPITAL_COMMUNITY): Payer: Self-pay | Admitting: Family Medicine

## 2020-01-31 HISTORY — PX: LAPAROTOMY: SHX154

## 2020-01-31 LAB — COMPREHENSIVE METABOLIC PANEL
ALT: 62 U/L — ABNORMAL HIGH (ref 0–44)
AST: 40 U/L (ref 15–41)
Albumin: 3.4 g/dL — ABNORMAL LOW (ref 3.5–5.0)
Alkaline Phosphatase: 79 U/L (ref 38–126)
Anion gap: 14 (ref 5–15)
BUN: 7 mg/dL (ref 6–20)
CO2: 30 mmol/L (ref 22–32)
Calcium: 8.3 mg/dL — ABNORMAL LOW (ref 8.9–10.3)
Chloride: 92 mmol/L — ABNORMAL LOW (ref 98–111)
Creatinine, Ser: 0.61 mg/dL (ref 0.44–1.00)
GFR, Estimated: >60 mL/min (ref >60)
Glucose, Bld: 84 mg/dL (ref 70–99)
Potassium: 3.7 mmol/L (ref 3.5–5.1)
Sodium: 136 mmol/L (ref 135–145)
Total Bilirubin: 1.1 mg/dL (ref 0.3–1.2)
Total Protein: 6.8 g/dL (ref 6.5–8.1)

## 2020-01-31 LAB — CBC
HCT: 35.9 % — ABNORMAL LOW (ref 36.0–46.0)
Hemoglobin: 11.3 g/dL — ABNORMAL LOW (ref 12.0–15.0)
MCH: 26.9 pg (ref 26.0–34.0)
MCHC: 31.5 g/dL (ref 30.0–36.0)
MCV: 85.5 fL (ref 80.0–100.0)
Platelets: 283 10*3/uL (ref 150–400)
RBC: 4.2 MIL/uL (ref 3.87–5.11)
RDW: 14.6 % (ref 11.5–15.5)
WBC: 7.1 10*3/uL (ref 4.0–10.5)
nRBC: 0 % (ref 0.0–0.2)

## 2020-01-31 LAB — TYPE AND SCREEN
ABO/RH(D): O POS
Antibody Screen: NEGATIVE

## 2020-01-31 LAB — SURGICAL PCR SCREEN
MRSA, PCR: NEGATIVE
Staphylococcus aureus: NEGATIVE

## 2020-01-31 LAB — MAGNESIUM: Magnesium: 2.4 mg/dL (ref 1.7–2.4)

## 2020-01-31 LAB — GLUCOSE, CAPILLARY
Glucose-Capillary: 107 mg/dL — ABNORMAL HIGH (ref 70–99)
Glucose-Capillary: 71 mg/dL (ref 70–99)
Glucose-Capillary: 77 mg/dL (ref 70–99)
Glucose-Capillary: 77 mg/dL (ref 70–99)

## 2020-01-31 LAB — ABO/RH: ABO/RH(D): O POS

## 2020-01-31 LAB — PHOSPHORUS: Phosphorus: 2.8 mg/dL (ref 2.5–4.6)

## 2020-01-31 SURGERY — LAPAROTOMY, EXPLORATORY
Anesthesia: General | Site: Abdomen

## 2020-01-31 MED ORDER — ALBUTEROL SULFATE HFA 108 (90 BASE) MCG/ACT IN AERS
1.0000 | INHALATION_SPRAY | RESPIRATORY_TRACT | Status: DC | PRN
  Administered 2020-02-04: 2 via RESPIRATORY_TRACT
  Filled 2020-01-31 (×2): qty 6.7

## 2020-01-31 MED ORDER — MIDAZOLAM HCL 2 MG/2ML IJ SOLN
INTRAMUSCULAR | Status: AC
  Filled 2020-01-31: qty 2

## 2020-01-31 MED ORDER — ALBUMIN HUMAN 5 % IV SOLN: INTRAVENOUS | Status: DC | PRN

## 2020-01-31 MED ORDER — PROMETHAZINE HCL 25 MG/ML IJ SOLN: 6.2500 mg | INTRAMUSCULAR | Status: DC | PRN

## 2020-01-31 MED ORDER — SUGAMMADEX SODIUM 500 MG/5ML IV SOLN
INTRAVENOUS | Status: AC
  Filled 2020-01-31: qty 5

## 2020-01-31 MED ORDER — DEXAMETHASONE SODIUM PHOSPHATE 10 MG/ML IJ SOLN
INTRAMUSCULAR | Status: DC | PRN
  Administered 2020-01-31: 5 mg via INTRAVENOUS

## 2020-01-31 MED ORDER — BUPIVACAINE LIPOSOME 1.3 % IJ SUSP
20.0000 mL | Freq: Once | INTRAMUSCULAR | Status: AC
  Administered 2020-01-31: 20 mL
  Filled 2020-01-31: qty 20

## 2020-01-31 MED ORDER — ROCURONIUM BROMIDE 10 MG/ML (PF) SYRINGE
PREFILLED_SYRINGE | INTRAVENOUS | Status: AC
  Filled 2020-01-31: qty 10

## 2020-01-31 MED ORDER — HYDROMORPHONE HCL 1 MG/ML IJ SOLN
INTRAMUSCULAR | Status: AC
  Administered 2020-01-31: 0.5 mg via INTRAVENOUS
  Filled 2020-01-31: qty 1

## 2020-01-31 MED ORDER — ROCURONIUM BROMIDE 10 MG/ML (PF) SYRINGE
PREFILLED_SYRINGE | INTRAVENOUS | Status: AC
  Filled 2020-01-31: qty 30

## 2020-01-31 MED ORDER — ROCURONIUM BROMIDE 10 MG/ML (PF) SYRINGE
PREFILLED_SYRINGE | INTRAVENOUS | Status: DC | PRN
  Administered 2020-01-31 (×2): 10 mg via INTRAVENOUS
  Administered 2020-01-31: 20 mg via INTRAVENOUS
  Administered 2020-01-31 (×3): 10 mg via INTRAVENOUS
  Administered 2020-01-31: 60 mg via INTRAVENOUS

## 2020-01-31 MED ORDER — FENTANYL CITRATE (PF) 250 MCG/5ML IJ SOLN
INTRAMUSCULAR | Status: AC
  Filled 2020-01-31: qty 5

## 2020-01-31 MED ORDER — LACTATED RINGERS IV SOLN: INTRAVENOUS | Status: DC | PRN

## 2020-01-31 MED ORDER — INSULIN ASPART 100 UNIT/ML ~~LOC~~ SOLN
0.0000 [IU] | Freq: Four times a day (QID) | SUBCUTANEOUS | Status: DC
  Administered 2020-02-03 – 2020-02-04 (×2): 1 [IU] via SUBCUTANEOUS

## 2020-01-31 MED ORDER — SUGAMMADEX SODIUM 200 MG/2ML IV SOLN
INTRAVENOUS | Status: DC | PRN
  Administered 2020-01-31: 300 mg via INTRAVENOUS

## 2020-01-31 MED ORDER — ACETAMINOPHEN 10 MG/ML IV SOLN
1000.0000 mg | Freq: Four times a day (QID) | INTRAVENOUS | Status: AC
  Administered 2020-01-31 – 2020-02-01 (×3): 1000 mg via INTRAVENOUS
  Filled 2020-01-31 (×4): qty 100

## 2020-01-31 MED ORDER — MEPERIDINE HCL 50 MG/ML IJ SOLN: 6.2500 mg | INTRAMUSCULAR | Status: DC | PRN

## 2020-01-31 MED ORDER — ACETAMINOPHEN 10 MG/ML IV SOLN
1000.0000 mg | Freq: Once | INTRAVENOUS | Status: DC | PRN
  Administered 2020-01-31: 1000 mg via INTRAVENOUS

## 2020-01-31 MED ORDER — LACTATED RINGERS IV SOLN: INTRAVENOUS | Status: DC

## 2020-01-31 MED ORDER — SUCCINYLCHOLINE CHLORIDE 200 MG/10ML IV SOSY
PREFILLED_SYRINGE | INTRAVENOUS | Status: DC | PRN
  Administered 2020-01-31: 160 mg via INTRAVENOUS

## 2020-01-31 MED ORDER — BUPIVACAINE-EPINEPHRINE (PF) 0.25% -1:200000 IJ SOLN
INTRAMUSCULAR | Status: AC
  Filled 2020-01-31: qty 30

## 2020-01-31 MED ORDER — PROPOFOL 10 MG/ML IV BOLUS
INTRAVENOUS | Status: AC
  Filled 2020-01-31: qty 20

## 2020-01-31 MED ORDER — HYDROCODONE-ACETAMINOPHEN 7.5-325 MG PO TABS: 1.0000 | ORAL_TABLET | Freq: Once | ORAL | Status: DC | PRN

## 2020-01-31 MED ORDER — KETAMINE HCL 100 MG/ML IJ SOLN
INTRAMUSCULAR | Status: DC | PRN
  Administered 2020-01-31: 40 mg via INTRAVENOUS
  Administered 2020-01-31: 10 mg via INTRAVENOUS

## 2020-01-31 MED ORDER — CEFAZOLIN SODIUM-DEXTROSE 2-4 GM/100ML-% IV SOLN
2.0000 g | Freq: Once | INTRAVENOUS | Status: AC
  Administered 2020-01-31: 2 g via INTRAVENOUS
  Filled 2020-01-31: qty 100

## 2020-01-31 MED ORDER — DEXAMETHASONE SODIUM PHOSPHATE 10 MG/ML IJ SOLN
INTRAMUSCULAR | Status: AC
  Filled 2020-01-31: qty 1

## 2020-01-31 MED ORDER — PROPOFOL 10 MG/ML IV BOLUS
INTRAVENOUS | Status: DC | PRN
  Administered 2020-01-31: 150 mg via INTRAVENOUS

## 2020-01-31 MED ORDER — PHENYLEPHRINE 40 MCG/ML (10ML) SYRINGE FOR IV PUSH (FOR BLOOD PRESSURE SUPPORT)
PREFILLED_SYRINGE | INTRAVENOUS | Status: AC
  Filled 2020-01-31: qty 10

## 2020-01-31 MED ORDER — HYDROMORPHONE HCL 1 MG/ML IJ SOLN
0.2500 mg | INTRAMUSCULAR | Status: DC | PRN
  Administered 2020-01-31 (×2): 0.5 mg via INTRAVENOUS

## 2020-01-31 MED ORDER — MIDAZOLAM HCL 5 MG/5ML IJ SOLN
INTRAMUSCULAR | Status: DC | PRN
  Administered 2020-01-31: 2 mg via INTRAVENOUS

## 2020-01-31 MED ORDER — ONDANSETRON HCL 4 MG/2ML IJ SOLN
INTRAMUSCULAR | Status: AC
  Filled 2020-01-31: qty 2

## 2020-01-31 MED ORDER — PHENYLEPHRINE 40 MCG/ML (10ML) SYRINGE FOR IV PUSH (FOR BLOOD PRESSURE SUPPORT)
PREFILLED_SYRINGE | INTRAVENOUS | Status: DC | PRN
  Administered 2020-01-31: 80 ug via INTRAVENOUS

## 2020-01-31 MED ORDER — EPHEDRINE 5 MG/ML INJ
INTRAVENOUS | Status: AC
  Filled 2020-01-31: qty 10

## 2020-01-31 MED ORDER — 0.9 % SODIUM CHLORIDE (POUR BTL) OPTIME
TOPICAL | Status: DC | PRN
  Administered 2020-01-31: 2000 mL

## 2020-01-31 MED ORDER — ONDANSETRON HCL 4 MG/2ML IJ SOLN
INTRAMUSCULAR | Status: DC | PRN
  Administered 2020-01-31: 4 mg via INTRAVENOUS

## 2020-01-31 MED ORDER — ENOXAPARIN SODIUM 40 MG/0.4ML ~~LOC~~ SOLN
40.0000 mg | SUBCUTANEOUS | Status: DC
  Administered 2020-02-01 – 2020-02-08 (×8): 40 mg via SUBCUTANEOUS
  Filled 2020-01-31 (×8): qty 0.4

## 2020-01-31 MED ORDER — ACETAMINOPHEN 10 MG/ML IV SOLN
INTRAVENOUS | Status: AC
  Filled 2020-01-31: qty 100

## 2020-01-31 MED ORDER — BUPIVACAINE-EPINEPHRINE (PF) 0.25% -1:200000 IJ SOLN
INTRAMUSCULAR | Status: DC | PRN
  Administered 2020-01-31: 30 mL

## 2020-01-31 MED ORDER — ALUM & MAG HYDROXIDE-SIMETH 200-200-20 MG/5ML PO SUSP
30.0000 mL | Freq: Four times a day (QID) | ORAL | Status: DC | PRN
  Administered 2020-02-04 – 2020-02-07 (×3): 30 mL
  Filled 2020-01-31 (×3): qty 30

## 2020-01-31 MED ORDER — FENTANYL CITRATE (PF) 100 MCG/2ML IJ SOLN
INTRAMUSCULAR | Status: DC | PRN
  Administered 2020-01-31 (×10): 50 ug via INTRAVENOUS

## 2020-01-31 MED ORDER — METHOCARBAMOL 1000 MG/10ML IJ SOLN
1000.0000 mg | Freq: Three times a day (TID) | INTRAVENOUS | Status: DC
  Administered 2020-01-31 – 2020-02-04 (×11): 1000 mg via INTRAVENOUS
  Filled 2020-01-31 (×3): qty 1000
  Filled 2020-01-31: qty 10
  Filled 2020-01-31 (×5): qty 1000
  Filled 2020-01-31: qty 10
  Filled 2020-01-31: qty 1000

## 2020-01-31 MED ORDER — LIDOCAINE 2% (20 MG/ML) 5 ML SYRINGE
INTRAMUSCULAR | Status: DC | PRN
  Administered 2020-01-31: 80 mg via INTRAVENOUS

## 2020-01-31 MED ORDER — KETAMINE HCL 10 MG/ML IJ SOLN
INTRAMUSCULAR | Status: AC
  Filled 2020-01-31: qty 1

## 2020-01-31 MED ORDER — LIDOCAINE HCL (PF) 2 % IJ SOLN
INTRAMUSCULAR | Status: AC
  Filled 2020-01-31: qty 5

## 2020-01-31 SURGICAL SUPPLY — 43 items
BLADE EXTENDED COATED 6.5IN (ELECTRODE) ×2 IMPLANT
CHLORAPREP W/TINT 26 (MISCELLANEOUS) ×2 IMPLANT
COVER MAYO STAND STRL (DRAPES) ×2 IMPLANT
COVER SURGICAL LIGHT HANDLE (MISCELLANEOUS) ×2 IMPLANT
COVER WAND RF STERILE (DRAPES) IMPLANT
DERMABOND ADVANCED (GAUZE/BANDAGES/DRESSINGS) ×1
DERMABOND ADVANCED .7 DNX12 (GAUZE/BANDAGES/DRESSINGS) ×1 IMPLANT
DRAIN CHANNEL 19F RND (DRAIN) ×2 IMPLANT
DRAPE LAPAROSCOPIC ABDOMINAL (DRAPES) IMPLANT
DRSG OPSITE POSTOP 4X10 (GAUZE/BANDAGES/DRESSINGS) IMPLANT
DRSG OPSITE POSTOP 4X6 (GAUZE/BANDAGES/DRESSINGS) IMPLANT
DRSG OPSITE POSTOP 4X8 (GAUZE/BANDAGES/DRESSINGS) IMPLANT
ELECT REM PT RETURN 15FT ADLT (MISCELLANEOUS) ×2 IMPLANT
EVACUATOR SILICONE 100CC (DRAIN) ×2 IMPLANT
GAUZE 4X4 16PLY RFD (DISPOSABLE) ×6 IMPLANT
GLOVE BIO SURGEON STRL SZ 6 (GLOVE) ×4 IMPLANT
GLOVE INDICATOR 6.5 STRL GRN (GLOVE) IMPLANT
GOWN STRL REUS W/TWL LRG LVL3 (GOWN DISPOSABLE) ×2 IMPLANT
GOWN STRL REUS W/TWL XL LVL3 (GOWN DISPOSABLE) ×2 IMPLANT
KIT TURNOVER KIT A (KITS) ×2 IMPLANT
MESH SOFT 12X12IN BARD (Mesh General) ×2 IMPLANT
PACK GENERAL/GYN (CUSTOM PROCEDURE TRAY) ×2 IMPLANT
PENCIL SMOKE EVACUATOR (MISCELLANEOUS) IMPLANT
SPONGE LAP 18X18 RF (DISPOSABLE) IMPLANT
STAPLER VISISTAT 35W (STAPLE) IMPLANT
SUT ETHIBOND CT1 BRD #0 30IN (SUTURE) ×4 IMPLANT
SUT ETHILON 3 0 PS 1 (SUTURE) ×2 IMPLANT
SUT MNCRL AB 4-0 PS2 18 (SUTURE) IMPLANT
SUT NOVA T20/GS 25 (SUTURE) IMPLANT
SUT PDS AB 2-0 CT2 27 (SUTURE) ×8 IMPLANT
SUT SILK 2 0 (SUTURE) ×1
SUT SILK 2 0 SH CR/8 (SUTURE) ×2 IMPLANT
SUT SILK 2-0 18XBRD TIE 12 (SUTURE) ×1 IMPLANT
SUT SILK 3 0 (SUTURE) ×1
SUT SILK 3 0 SH CR/8 (SUTURE) ×2 IMPLANT
SUT SILK 3-0 18XBRD TIE 12 (SUTURE) ×1 IMPLANT
SUT VIC AB 2-0 SH 18 (SUTURE) ×2 IMPLANT
SUT VIC AB 3-0 SH 8-18 (SUTURE) ×2 IMPLANT
TOWEL OR 17X26 10 PK STRL BLUE (TOWEL DISPOSABLE) ×2 IMPLANT
TOWEL OR NON WOVEN STRL DISP B (DISPOSABLE) ×2 IMPLANT
TRAY FOLEY MTR SLVR 14FR STAT (SET/KITS/TRAYS/PACK) ×2 IMPLANT
TRAY FOLEY MTR SLVR 16FR STAT (SET/KITS/TRAYS/PACK) IMPLANT
YANKAUER SUCT BULB TIP 10FT TU (MISCELLANEOUS) ×2 IMPLANT

## 2020-01-31 NOTE — Progress Notes (Signed)
Initial Nutrition Assessment  DOCUMENTATION CODES:   Morbid obesity  INTERVENTION:   -Will monitor for diet advancement. If by LOS day 8 (12/7) diet isn't advanced, need to consider nutrition support.   -If diet is advanced and tolerated, recommend Ensure MAX Protein po BID, each supplement provides 150 kcal and 30 grams of protein  NUTRITION DIAGNOSIS:   Increased nutrient needs related to acute illness, post-op healing (SBO) as evidenced by estimated needs.  GOAL:   Patient will meet greater than or equal to 90% of their needs  MONITOR:   Diet advancement, Weight trends, I & O's, Labs  REASON FOR ASSESSMENT:   LOS, NPO/Clear Liquid Diet    ASSESSMENT:   54 years old female with past medical history of colon cancer status post colectomy, colostomy and parastomal hernia, recurrent large bowel obstruction presented to the emergency room with abdominal pain and poor output from ostomy.  Patient was then admitted to hospital for treatment of bowel obstruction.  Per chart review, pt admitted 11/29 for N/V PTA. Pt with colostomy.   Patient continues to be NPO for surgery today. Pt has been unable to keep diet advanced given SBO and nausea. No output in ostomy since 12/4.  Pt in OR, per surgery note, plan is to have ex lap with parastomal hernia repair.  If patient unable to have diet advanced following surgery, may need to consider TPN d/t no PO x 8 days 12/7. Will continue to monitor. If diet is able to be tolerated, will add nutritional supplements for additional protein.  Per weight records, no weight loss noted PTA.   I/Os: +4.6L since admit NGT: 1225 ml x 24 hrs  Medications: Miralax, Lactated ringers, IV Zofran  Labs reviewed: CBGs: 71-77  NUTRITION - FOCUSED PHYSICAL EXAM:  Unable to complete  Diet Order:   Diet Order            Diet NPO time specified Except for: Ice Chips, Sips with Meds, Other (See Comments)  Diet effective now                  EDUCATION NEEDS:   No education needs have been identified at this time  Skin:  Skin Assessment: Reviewed RN Assessment  Last BM:  12/4 -colostomy  Height:   Ht Readings from Last 1 Encounters:  01/31/20 5\' 3"  (1.6 m)    Weight:   Wt Readings from Last 1 Encounters:  01/31/20 124.7 kg   BMI:  Body mass index is 48.71 kg/m.  Estimated Nutritional Needs:   Kcal:  1800-2000  Protein:  80-100g  Fluid:  2L/day  Clayton Bibles, MS, RD, LDN Inpatient Clinical Dietitian Contact information available via Amion

## 2020-01-31 NOTE — Progress Notes (Signed)
Pt partially removed NG tube, able to re-place tube, alerted Dr. Thermon Leyland and called Imaging to let them know about x-ray order. Low, intermittent suction stopped and will be restarted upon positive confirmation of NG tube placement.

## 2020-01-31 NOTE — Transfer of Care (Signed)
Immediate Anesthesia Transfer of Care Note  Patient: Cindy Peterson  Procedure(s) Performed: OPEN REPAIR OF A PARASTOMAL HERNIA WITH MESH WITH LYSIS OF ADHESIONS (N/A Abdomen)  Patient Location: PACU  Anesthesia Type:General  Level of Consciousness: sedated, patient cooperative and responds to stimulation  Airway & Oxygen Therapy: Patient Spontanous Breathing and Patient connected to face mask oxygen  Post-op Assessment: Report given to RN and Post -op Vital signs reviewed and stable  Post vital signs: Reviewed and stable  Last Vitals:  Vitals Value Taken Time  BP 166/107 01/31/20 1635  Temp    Pulse 90 01/31/20 1640  Resp 18 01/31/20 1640  SpO2 100 % 01/31/20 1640  Vitals shown include unvalidated device data.  Last Pain:  Vitals:   01/31/20 1050  TempSrc: Oral  PainSc:       Patients Stated Pain Goal: 4 (20/91/98 0221)  Complications: No complications documented.

## 2020-01-31 NOTE — Anesthesia Preprocedure Evaluation (Addendum)
Anesthesia Evaluation  Patient identified by MRN, date of birth, ID band Patient awake    Reviewed: Allergy & Precautions, NPO status , Patient's Chart, lab work & pertinent test results  Airway Mallampati: II  TM Distance: >3 FB Neck ROM: Full    Dental no notable dental hx. (+) Teeth Intact, Dental Advisory Given   Pulmonary asthma ,    Pulmonary exam normal breath sounds clear to auscultation       Cardiovascular hypertension, Normal cardiovascular exam Rhythm:Regular Rate:Normal     Neuro/Psych negative neurological ROS  negative psych ROS   GI/Hepatic Neg liver ROS, GERD  ,Hx of colon CA colostomy in place   Endo/Other  Hypothyroidism Morbid obesity  Renal/GU K+ 3.7 Cr 0.61     Musculoskeletal   Abdominal (+) + obese,   Peds  Hematology  (+) anemia , Hgb 11.3 Plt 285   Anesthesia Other Findings All :latex  Reproductive/Obstetrics PCOS                          Anesthesia Physical Anesthesia Plan  ASA: III and emergent  Anesthesia Plan: General   Post-op Pain Management:    Induction: Intravenous  PONV Risk Score and Plan: Treatment may vary due to age or medical condition, Ondansetron, Dexamethasone and Midazolam  Airway Management Planned: Video Laryngoscope Planned and Oral ETT  Additional Equipment: None  Intra-op Plan:   Post-operative Plan: Extubation in OR  Informed Consent: I have reviewed the patients History and Physical, chart, labs and discussed the procedure including the risks, benefits and alternatives for the proposed anesthesia with the patient or authorized representative who has indicated his/her understanding and acceptance.     Dental advisory given  Plan Discussed with: CRNA and Anesthesiologist  Anesthesia Plan Comments:        Anesthesia Quick Evaluation

## 2020-01-31 NOTE — Op Note (Signed)
Patient: Cindy Peterson (1965/06/03, [id number])  Date of Surgery: 01/24/2020 - 01/31/2020   Preoperative Diagnosis:  Small bowel obstruction Ventral hernia Parastomal hernia  Postoperative Diagnosis:  Small bowel obstruction secondary to parastomal hernia Ventral hernia  Surgical Procedure:  Open retrorectus Sugarbaker mesh repair of parastomal hernia Midline retrorectus ventral incisional hernia repair Bilateral posterior rectus sheath release and advancement Left sided transversus abdominis myofascial release and advancement Lysis of adhesions (2 hours) Intra-operative TAP block  Operative Team Members:  Surgeon(s) and Role:  Zailynn Brandel, Nickola Major, MD - Primary   Barkley Boards, PA - assistant  Anesthesiologist: Suzette Battiest, MD; Freddrick March, MD CRNA: Gean Maidens, CRNA; Niel Hummer, CRNA   Anesthesia: General  Fluids:  Total I/O In: 6063 [I.V.:2000; IV Piggyback:350] Out: 150 [Emesis/NG output:50; KZSWF:093]  Complications: none  Drains:  (19 Fr) Jackson-Pratt drain with closed bulb suction in the retrorectus space, exiting the right lower quadrant  Specimen: None  Disposition:  PACU - hemodynamically stable.  Plan of Care: Continued care on the floor.    Indications for Procedure: [name] is a 54 y.o. female who presented with a bowel obstruction, ventral hernia, and parastomal hernia.  She failed conservative management of her bowel obstruction on the floor with NG tube decompression so the recommendation was made to proceed with exploratory laparotomy and possible hernia repairs.  The risks, benefits, and alternatives were discussed the patient in full all questions were answered the patient granted consent to proceed.  Findings:   Hernia Location: Infraumbilical suprapubic ventral incisional hernia; left lower quadrant parastomal hernia at colostomy site.  Hernia Size:  11 cm vertical x 15 cm horizontal  Mesh Size &Type:  30 cm  vertical x 25 cm horizontal Bard Soft Mesh Mesh Position: Retromuscular (Modified Sugarbaker TAR parastomal repair) Myofascial Releases: Bilateral rectus myofascial release (Rives-Stoppa) Left sided transversus abdominis release (TAR)   Parastomal hernia containing dilated loops of small bowel likely the cause of her bowel obstruction.   Description of Procedure: On the date stated above patient taken operating room suite and placed supine position.  Antibiotics were given prior to the case start.  A Foley catheter was placed at the conclusion of the case.  SCDs were placed on the lower extremities.  General endotracheal anesthesia was induced.  A timeout was completed verifying correct patient, procedure, positioning, and equipment needed for the case.   The colostomy site was closed with a running 2-0 silk suture. A wide prep and drape was performed.  A midline incision was made and dissection was carried down through subcutaneous tissue. The abdomen was entered safely.  There were dense omental and small bowel adhesions.  A meticulous and tedious sharp lysis of adhesions was carried out.  This was completed with no injury to the viscera.  Due ot the dense adehsions from her previous rectal surgery, this took a total of 2 hours to complete and added significant complexity to the case.  After the anterior abdominal wall was cleared off of all adhesions, a large safety towel was placed over the viscera to protect them.  The hernia defect was located in the infraumbilical position. The parastomal defect at the colostomy site was within the left rectus musculature. The parastomal defect alone measured 5cm x 8cm.  The total hernia defect area was measured as 15 cm horizontal and 11 cm vertical.  A rectus myofascial release (Rives-Stoppa) was performed on the LEFT side. The posterior rectus sheath was incised just lateral to the  hernia edge.  This incision in the posterior rectus sheath was extended  both cephalad and caudal to the hernia defect.  Dissection was carried out laterally in the retromuscular plane to the edge of the rectus sheath peeling away the posterior rectus sheath from the underside of the rectus muscle. The segmental innervation to the rectus muscle was preserved.  The site of the parastomal defect was identified. The colon was completely encircled outside of the hernia sac, but within the retromuscular space. The adherent posterior rectus sheath was then dissected away from the edge of the hernia sac allowing the bowel to freely be mobile through the defect in the posterior sheath. The rectus myofascial release accomplished medialization of the posterior rectus sheath towards the midline and release of the rectus muscle from its encasement in the rectus sheath, allowing for widening of the rectus muscle and advancement towards the midline.  A transversus abdominis release (TAR) was performed on the LEFT side.  The transversus abdominis muscle was identified within the posterior rectus sheath and incised vertically along its entire length, entering the pre-peritoneal or pre-transversalis fascia plane.  The peritoneum was subsequently peeled away from the underside of the divided transversus abdominis muscle.  This dissection was carried out laterally towards the retroperitoneum and was also carried out lateral to the colon.  The TAR accomplished additional medialization of the posterior rectus sheath with its attached peritoneum towards the midline to allow for visceral sac closure.  The TAR also provided further offset of tension with advancement of the rectus muscle towards the midline, as it remained attached to the external and internal abdominal oblique muscles. The colon was now repositioned laterally within the retromuscular space. The posterior rectus sheath was reapproximated and the medial edge of the colostomy, medially towards the midline.   A rectus myofascial release  (Rives-Stoppa) was performed on the RIGHT side. The posterior rectus sheath was incised just lateral to the hernia edge.  This incision in the posterior rectus sheath was extended both cephalad and caudal to the hernia defect.  Dissection was carried out laterally in the retromuscular plane to the edge of the rectus sheath peeling away the posterior rectus sheath from the underside of the rectus muscle. The segmental innervation to the rectus muscle was preserved.  The rectus myofascial release accomplished medialization of the posterior rectus sheath towards the midline and release of the rectus muscle from its encasement in the rectus sheath, allowing for widening of the rectus muscle and advancement towards the midline.  The towel was removed and the posterior fascia was reapproximated in the midline with a continuous 2-0 PDS suture. The colon remained, lateralized within the retromuscular plane.  A transversus abdominis plane (TAP) block was performed bilaterally with a 50 mL mixture of liposomal bupivacaine and marcaine.  The anesthetic (25 mL) was first injected into the plane between the transversus abdominis and internal abdominal oblique muscles on the left. The TAP was repeated on the contralateral side with 25 mL of the mixture.   A new set of sterile gloves were used prior to handling the mesh. A piece of Bard soft mesh was brought into the operative field and sized to 30 x 25 cm.    In the left abdomen, the mesh was positioned in the retromuscular plane so that it lateralized the colon in the style of a modified, retromuscular Sugarbaker repair. The colon would then course from its intraperitoneal location, through the peritoneum in the lateral abdomen, up and along the superior  aspect of the mesh, in between the mesh and the abdominal wall musculature, before it exited through the left rectus muscle. The colon was lying in good position.  The mesh space irrigated with saline.  One 87 [place]  Blake drain was placed in the retromuscular position. The rectus muscles were re-approximated in the midline utilizing a continuous 2-0 PDS suture taking 5 mm bites of the fascia with 5 mm advancement. The fascia came together well. The subcutaneous tissue was irrigated with the antibiotic irrigation solution. Scarpa's fascia was reapproximated with interrupted 2-0 Vicryl suture.  The subcuticular tissue was reapproximated with buried, interrupted 3-0 Vicryl suture.  The skin was closed with a 4-0 Monocryl subcuticular suture and skin glue.  All sponge and needle counts were correct at the end of this case.      Louanna Raw, MD General, Bariatric, & Minimally Invasive Surgery [facility], [place]

## 2020-01-31 NOTE — Progress Notes (Signed)
PROGRESS NOTE    Cindy Peterson  DUK:[id number] DOB: 07/16/1965 DOA: 01/24/2020 PCP: Karlene Einstein, MD    Brief Narrative:   Patient is a 54 years old female with past medical history of colon cancer status post colectomy, colostomy and parastomal hernia, recurrent large bowel obstruction presented to the emergency room with abdominal pain and poor output from ostomy.  Patient was then admitted to hospital for treatment of bowel obstruction.    Assessment & Plan:   Principal Problem:   Small bowel obstruction (HCC) Active Problems:   Parastomal hernia   Essential hypertension   Acid reflux   Morbid obesity with BMI of 45.0-49.9, adult (HCC)   PCOS (polycystic ovarian syndrome)   Primary insomnia   Colostomy in place [facility])   Constipation, chronic  Partial small bowel obstruction/parastomal hernia/ileus with constipation: Surgery on board.  Currently on conservative treatment.  Failed NG tube clamping trial.  Patient still has significant NG output and feels nauseated with abdominal pain and discomfort.  Has not had a bowel movement or passed gas.  Will follow surgical recommendations.  Surgery planning for exploratory laparotomy with parastomal hernia repair with mesh.  Essential hypertension: Currently NPO.  Continue antihypertensive medication through IV route if needed.  Closely monitor.  Type 2 diabetes mellitus: On Metformin at home.  Continue sliding scale insulin, Accu-Cheks.  Latest A1c of 6.2.  Frequency of sliding has been increased to every 6 hourly due to n.p.o. status.  Watch closely for hypoglycemia   DVT prophylaxis: enoxaparin (LOVENOX) injection 40 mg Start: 01/25/20 0600   Code Status:  Full code  Family Communication:  None.  Spoke with the patient at bedside  Disposition Plan:  Status is: Inpatient  Remains inpatient appropriate because:IV treatments appropriate due to intensity of illness or inability to take PO and Inpatient level of care appropriate  due to severity of illness, bowel obstruction, need for surgical intervention   Dispo: The patient is from: Home              Anticipated d/c is to: Home likely in 2 to 3 days              Anticipated d/c date is: When stable from surgical standpoint.              Patient currently is not medically stable to d/c.   Consultants:   General surgery  Procedures:   None  Antimicrobials:   None   Subjective: Today, patient was seen and examined at bedside.  Patient still complains of nausea still on NG tube with significant output.  Feels bloated.  Has not had bowel movement or gas.   Objective: Vitals:   01/29/20 2125 01/30/20 0550 01/30/20 1352 01/30/20 2011  BP: 121/71 134/63 135/80 140/86  Pulse: 72 72 76 75  Resp: 18 18 16 18   Temp: 97.8 F (36.6 C) 98.3 F (36.8 C) 98.2 F (36.8 C) 97.6 F (36.4 C)  TempSrc: Oral Oral Oral Oral  SpO2: 100% 97% 100% 100%  Weight:      Height:        Intake/Output Summary (Last 24 hours) at 01/31/2020 1014 Last data filed at 01/31/2020 0940 Gross per 24 hour  Intake 2267.74 ml  Output 1225 ml  Net 1042.74 ml   Filed Weights   01/24/20 1655  Weight: 122.5 kg   Body mass index is 47.83 kg/m.  Physical examination:  General: Morbidly obese built, not in obvious distress, NG tube in place under  intermittent suction- significant output. HENT:   No scleral pallor or icterus noted. Oral mucosa is mildly dry Chest:   Diminished breath sounds bilaterally. No crackles or wheezes.  CVS: S1 &S2 heard. No murmur.  Regular rate and rhythm. Abdomen: Left lower quadrant colostomy with no output, parastomal hernia, mildly distended abdomen, obese, Extremities: No cyanosis, clubbing or edema.  Peripheral pulses are palpable. Psych: Alert, awake and oriented, normal mood CNS:  No cranial nerve deficits.  Power equal in all extremities.   Skin: Warm and dry.  No rashes noted.   Data Reviewed: I have reviewed the labs and imaging studies  as below  CBC: Recent Labs  Lab 01/24/20 1845 01/30/20 0603 01/31/20 0519  WBC 7.0 7.5 7.1  NEUTROABS  --  5.6  --   HGB 12.1 12.1 11.3*  HCT 37.3 38.0 35.9*  MCV 83.3 84.3 85.5  PLT 280 280 935   Basic Metabolic Panel: Recent Labs  Lab 01/26/20 1125 01/27/20 0536 01/28/20 0524 01/30/20 0603 01/31/20 0519  NA 138 138 137 134* 136  K 3.5 3.8 3.7 3.0* 3.7  CL 99 99 96* 90* 92*  CO2 28 28 28 30 30   GLUCOSE 125* 110* 114* 96 84  BUN 13 11 11 10 7   CREATININE 0.62 0.61 0.62 0.58 0.61  CALCIUM 8.6* 8.7* 8.5* 8.4* 8.3*  MG 2.1  --  2.1 2.3 2.4  PHOS  --   --   --  3.0 2.8   GFR: Estimated Creatinine Clearance: 104.4 mL/min (by C-G formula based on SCr of 0.61 mg/dL). Liver Function Tests: Recent Labs  Lab 01/24/20 1810 01/30/20 0603 01/31/20 0519  AST 25 37 40  ALT 37 62* 62*  ALKPHOS 71 82 79  BILITOT 0.5 1.3* 1.1  PROT 8.8* 7.4 6.8  ALBUMIN 4.7 3.8 3.4*   Recent Labs  Lab 01/24/20 1810  LIPASE 19   No results for input(s): AMMONIA in the last 168 hours. Coagulation Profile: No results for input(s): INR, PROTIME in the last 168 hours. Cardiac Enzymes: No results for input(s): CKTOTAL, CKMB, CKMBINDEX, TROPONINI in the last 168 hours. BNP (last 3 results) No results for input(s): PROBNP in the last 8760 hours. HbA1C: No results for input(s): HGBA1C in the last 72 hours. CBG: Recent Labs  Lab 01/30/20 2014 01/30/20 2048 01/31/20 0000 01/31/20 0411 01/31/20 0718  GLUCAP 55* 97 77 77 71   Lipid Profile: No results for input(s): CHOL, HDL, LDLCALC, TRIG, CHOLHDL, LDLDIRECT in the last 72 hours. Thyroid Function Tests: No results for input(s): TSH, T4TOTAL, FREET4, T3FREE, THYROIDAB in the last 72 hours. Anemia Panel: No results for input(s): VITAMINB12, FOLATE, FERRITIN, TIBC, IRON, RETICCTPCT in the last 72 hours. Sepsis Labs: Recent Labs  Lab 01/24/20 1810 01/24/20 1957 01/25/20 0922  LATICACIDVEN 2.1* 2.7* 1.9    Recent Results (from the  past 240 hour(s))  Resp Panel by RT-PCR (Flu A&B, Covid) Nasopharyngeal Swab     Status: None   Collection Time: 01/24/20  7:24 PM   Specimen: Nasopharyngeal Swab; Nasopharyngeal(NP) swabs in vial transport medium  Result Value Ref Range Status   SARS Coronavirus 2 by RT PCR NEGATIVE NEGATIVE Final    Comment: (NOTE) SARS-CoV-2 target nucleic acids are NOT DETECTED.  The SARS-CoV-2 RNA is generally detectable in upper respiratory specimens during the acute phase of infection. The lowest concentration of SARS-CoV-2 viral copies this assay can detect is 138 copies/mL. A negative result does not preclude SARS-Cov-2 infection and should not be used  as the sole basis for treatment or other patient management decisions. A negative result may occur with  improper specimen collection/handling, submission of specimen other than nasopharyngeal swab, presence of viral mutation(s) within the areas targeted by this assay, and inadequate number of viral copies(<138 copies/mL). A negative result must be combined with clinical observations, patient history, and epidemiological information. The expected result is Negative.  Fact Sheet for Patients:  EntrepreneurPulse.com.au  Fact Sheet for Healthcare Providers:  IncredibleEmployment.be  This test is no t yet approved or cleared by the [place] FDA and  has been authorized for detection and/or diagnosis of SARS-CoV-2 by FDA under an Emergency Use Authorization (EUA). This EUA will remain  in effect (meaning this test can be used) for the duration of the COVID-19 declaration under Section 564(b)(1) of the Act, 21 U.S.C.section 360bbb-3(b)(1), unless the authorization is terminated  or revoked sooner.       Influenza A by PCR NEGATIVE NEGATIVE Final   Influenza B by PCR NEGATIVE NEGATIVE Final    Comment: (NOTE) The Xpert Xpress SARS-CoV-2/FLU/RSV plus assay is intended as an aid in the diagnosis of  influenza from Nasopharyngeal swab specimens and should not be used as a sole basis for treatment. Nasal washings and aspirates are unacceptable for Xpert Xpress SARS-CoV-2/FLU/RSV testing.  Fact Sheet for Patients: EntrepreneurPulse.com.au  Fact Sheet for Healthcare Providers: IncredibleEmployment.be  This test is not yet approved or cleared by the [place] FDA and has been authorized for detection and/or diagnosis of SARS-CoV-2 by FDA under an Emergency Use Authorization (EUA). This EUA will remain in effect (meaning this test can be used) for the duration of the COVID-19 declaration under Section 564(b)(1) of the Act, 21 U.S.C. section 360bbb-3(b)(1), unless the authorization is terminated or revoked.  Performed at [facility], [address] [place], [place] [address]        Radiology Studies: DG Abd 2 Views  Result Date: 01/30/2020 CLINICAL DATA:  Constipation.  Small bowel obstruction. EXAM: ABDOMEN - 2 VIEW COMPARISON:  January 27, 2020 FINDINGS: A mildly dilated loop of small bowel seen in the left lateral abdomen with apparent associated wall thickening. An air-fluid level is identified in these loops of bowel on the upright imaging. There is contrast in the ascending colon and air in the descending colon. An NG tube terminates in the region of the distal stomach or proximal duodenum. No other acute abnormalities. No free air, portal venous gas, or pneumatosis. IMPRESSION: Continued small bowel obstruction. A dilated loop of small bowel left abdomen appears to demonstrate some wall thickening. The NG tube distal tip is either in the distal stomach or proximal duodenum. Electronically Signed   By: Dorise Bullion III M.D   On: 01/30/2020 13:25     Scheduled Meds: . enoxaparin (LOVENOX) injection  40 mg Subcutaneous Q24H  . feeding supplement (GLUCERNA SHAKE)  237 mL Oral TID BM  . insulin aspart  0-9 Units Subcutaneous  Q6H  . polyethylene glycol  17 g Oral Daily   Continuous Infusions: .  ceFAZolin (ANCEF) IV    . lactated ringers 50 mL/hr at 01/30/20 1957  . methocarbamol (ROBAXIN) IV 500 mg (01/28/20 1404)     LOS: 7 days    Flora Lipps, MD [facility] 01/31/2020

## 2020-01-31 NOTE — Anesthesia Procedure Notes (Signed)
Procedure Name: Intubation Performed by: Gean Maidens, CRNA Pre-anesthesia Checklist: Patient identified, Emergency Drugs available, Suction available, Patient being monitored and Timeout performed Patient Re-evaluated:Patient Re-evaluated prior to induction Oxygen Delivery Method: Circle system utilized Preoxygenation: Pre-oxygenation with 100% oxygen Induction Type: IV induction and Rapid sequence Laryngoscope Size: Mac and 4 Grade View: Grade II Tube type: Oral Tube size: 7.0 mm Number of attempts: 1 Airway Equipment and Method: Stylet Placement Confirmation: ETT inserted through vocal cords under direct vision,  positive ETCO2 and breath sounds checked- equal and bilateral Secured at: 21 cm Tube secured with: Tape Dental Injury: Teeth and Oropharynx as per pre-operative assessment

## 2020-01-31 NOTE — Progress Notes (Signed)
Progress Note: General Surgery Service   Chief Complaint/Subjective: Continued brown high NG output.   No bowel function   Objective: Vital signs in last 24 hours: Temp:  [97.6 F (36.4 C)-98.2 F (36.8 C)] 97.6 F (36.4 C) (12/05 2011) Pulse Rate:  [75-76] 75 (12/05 2011) Resp:  [16-18] 18 (12/05 2011) BP: (135-140)/(80-86) 140/86 (12/05 2011) SpO2:  [100 %] 100 % (12/05 2011) Last BM Date: 01/29/20  Intake/Output from previous day: 12/05 0701 - 12/06 0700 In: 2267.7 [P.O.:180; I.V.:1287.7; IV Piggyback:800] Out: 1175 [Emesis/NG MEQAST:4196] Intake/Output this shift: No intake/output data recorded.  Gen: no distress, pleasant  Resp: Bilateral breath sounds, no increased work of breathing  Card: Regular rate and rhythm  Abd: High NG output, non-distended, non-tender, partially reducible parastomal hernia  Lab Results: CBC  Recent Labs    01/30/20 0603 01/31/20 0519  WBC 7.5 7.1  HGB 12.1 11.3*  HCT 38.0 35.9*  PLT 280 283   BMET Recent Labs    01/30/20 0603 01/31/20 0519  NA 134* 136  K 3.0* 3.7  CL 90* 92*  CO2 30 30  GLUCOSE 96 84  BUN 10 7  CREATININE 0.58 0.61  CALCIUM 8.4* 8.3*   PT/INR No results for input(s): LABPROT, INR in the last 72 hours. ABG No results for input(s): PHART, HCO3 in the last 72 hours.  Invalid input(s): PCO2, PO2  Anti-infectives: Anti-infectives (From admission, onward)   None      Medications: Scheduled Meds: . enoxaparin (LOVENOX) injection  40 mg Subcutaneous Q24H  . feeding supplement (GLUCERNA SHAKE)  237 mL Oral TID BM  . insulin aspart  0-9 Units Subcutaneous Q6H  . polyethylene glycol  17 g Oral Daily   Continuous Infusions: . lactated ringers 50 mL/hr at 01/30/20 1957  . methocarbamol (ROBAXIN) IV 500 mg (01/28/20 1404)   PRN Meds:.acetaminophen **OR** acetaminophen, alum & mag hydroxide-simeth, HYDROmorphone (DILAUDID) injection, methocarbamol (ROBAXIN) IV, ondansetron **OR** ondansetron (ZOFRAN)  IV, phenol  Assessment/Plan: Cindy Peterson is a 54 year old female who presented on 11/29 with a bowel obstruction and parastomal hernia.  Failing to improve with NG decompression and conservative management.  Discussed proceeding to surgery as bowel obstruction not resolving with NG decompression.   I recommend exploratory laparotomy with parastomal hernia repair with mesh.  Will check the OR schedule, may be able to add on later today.   LOS: 7 days   Felicie Morn, MD [facility]

## 2020-02-01 ENCOUNTER — Encounter (HOSPITAL_COMMUNITY): Payer: Self-pay | Admitting: Surgery

## 2020-02-01 LAB — COMPREHENSIVE METABOLIC PANEL
ALT: 50 U/L — ABNORMAL HIGH (ref 0–44)
AST: 30 U/L (ref 15–41)
Albumin: 3.3 g/dL — ABNORMAL LOW (ref 3.5–5.0)
Alkaline Phosphatase: 62 U/L (ref 38–126)
Anion gap: 16 — ABNORMAL HIGH (ref 5–15)
BUN: 7 mg/dL (ref 6–20)
CO2: 24 mmol/L (ref 22–32)
Calcium: 7.9 mg/dL — ABNORMAL LOW (ref 8.9–10.3)
Chloride: 95 mmol/L — ABNORMAL LOW (ref 98–111)
Creatinine, Ser: 0.64 mg/dL (ref 0.44–1.00)
GFR, Estimated: >60 mL/min (ref >60)
Glucose, Bld: 98 mg/dL (ref 70–99)
Potassium: 3.7 mmol/L (ref 3.5–5.1)
Sodium: 135 mmol/L (ref 135–145)
Total Bilirubin: 1.6 mg/dL — ABNORMAL HIGH (ref 0.3–1.2)
Total Protein: 6.4 g/dL — ABNORMAL LOW (ref 6.5–8.1)

## 2020-02-01 LAB — CBC
HCT: 39 % (ref 36.0–46.0)
Hemoglobin: 12.4 g/dL (ref 12.0–15.0)
MCH: 27 pg (ref 26.0–34.0)
MCHC: 31.8 g/dL (ref 30.0–36.0)
MCV: 84.8 fL (ref 80.0–100.0)
Platelets: 351 10*3/uL (ref 150–400)
RBC: 4.6 MIL/uL (ref 3.87–5.11)
RDW: 15.1 % (ref 11.5–15.5)
WBC: 9.7 10*3/uL (ref 4.0–10.5)
nRBC: 0 % (ref 0.0–0.2)

## 2020-02-01 LAB — GLUCOSE, CAPILLARY
Glucose-Capillary: 110 mg/dL — ABNORMAL HIGH (ref 70–99)
Glucose-Capillary: 114 mg/dL — ABNORMAL HIGH (ref 70–99)
Glucose-Capillary: 84 mg/dL (ref 70–99)
Glucose-Capillary: 85 mg/dL (ref 70–99)
Glucose-Capillary: 97 mg/dL (ref 70–99)

## 2020-02-01 LAB — MAGNESIUM: Magnesium: 2.2 mg/dL (ref 1.7–2.4)

## 2020-02-01 LAB — PHOSPHORUS: Phosphorus: 3.4 mg/dL (ref 2.5–4.6)

## 2020-02-01 MED ORDER — GABAPENTIN 300 MG PO CAPS
300.0000 mg | ORAL_CAPSULE | Freq: Three times a day (TID) | ORAL | Status: DC
  Administered 2020-02-02 – 2020-02-08 (×15): 300 mg via ORAL
  Filled 2020-02-01 (×15): qty 1

## 2020-02-01 MED ORDER — HYDRALAZINE HCL 20 MG/ML IJ SOLN: 10.0000 mg | Freq: Four times a day (QID) | INTRAMUSCULAR | Status: DC | PRN

## 2020-02-01 NOTE — Progress Notes (Signed)
PROGRESS NOTE    Cindy Peterson  SAY:[id number] DOB: 05-01-1965 DOA: 01/24/2020 PCP: Karlene Einstein, MD    Brief Narrative:   Patient is a 54 years old female with past medical history of colon cancer status post colectomy, colostomy and parastomal hernia, recurrent large bowel obstruction presented to the emergency room with abdominal pain and poor output from ostomy.  Patient was then admitted to hospital for treatment of bowel obstruction.  Patient was initially conservatively treated but her symptoms did not improve so patient underwent surgical intervention on 01/31/2020.  Assessment & Plan:   Principal Problem:   Small bowel obstruction (HCC) Active Problems:   Parastomal hernia   Essential hypertension   Acid reflux   Morbid obesity with BMI of 45.0-49.9, adult (HCC)   PCOS (polycystic ovarian syndrome)   Primary insomnia   Colostomy in place [facility])   Constipation, chronic  Partial small bowel obstruction/parastomal hernia/ileus with constipation: Patient was treated conservatively initially but did not improve and Failed NG tube clamping trial with significant NG output and abdominal discomfort.  Patient subsequently underwent.exploratory laparotomy with parastomal hernia repair with mesh on 01/31/2020.  Postoperative management as per general surgery  Essential hypertension: Currently NPO.  As needed hydralazine if needed.  Type 2 diabetes mellitus: On Metformin at home.  Continue sliding scale insulin, Accu-Cheks.     DVT prophylaxis: enoxaparin (LOVENOX) injection 40 mg Start: 02/01/20 1000   Code Status:  Full code  Family Communication:  Communicated with the patient.  Disposition Plan:  Status is: Inpatient  Remains inpatient appropriate because:IV treatments appropriate due to intensity of illness or inability to take PO and Inpatient level of care appropriate due to severity of illness, bowel obstruction status post exploratory laparotomy   Dispo: The  patient is from: Home              Anticipated d/c is to: Home likely in 2 to 3 days              Anticipated d/c date is: When stable from surgical standpoint.              Patient currently is not medically stable to d/c.   Consultants:   General surgery  Procedures:  Exploratory laparotomy with hernia repair on 01/31/2020  Antimicrobials:   None   Subjective: Today, patient was seen and examined at bedside complains of 5/10 abdominal pain.  Has not had any flatus or bowel movement in the colostomy.   Objective: Vitals:   01/31/20 2210 01/31/20 2300 02/01/20 0157 02/01/20 0623  BP: (!) 170/94 (!) 158/92 133/80 125/86  Pulse: 99  (!) 103 (!) 109  Resp: 17  17 17   Temp: 98.5 F (36.9 C)  98.1 F (36.7 C) 97.9 F (36.6 C)  TempSrc: Oral     SpO2: 98%  98% 98%  Weight:      Height:        Intake/Output Summary (Last 24 hours) at 02/01/2020 0727 Last data filed at 02/01/2020 0650 Gross per 24 hour  Intake 4128.81 ml  Output 3158 ml  Net 970.81 ml   Filed Weights   01/24/20 1655 01/31/20 1048  Weight: 122.5 kg 124.7 kg   Body mass index is 48.71 kg/m.  Physical examination:  General: Morbidly obese, not in obvious distress, lying still in bed, NG tube in place with brown output HENT:   No scleral pallor or icterus noted. Oral mucosa is moist.  Chest:    Diminished breath sounds bilaterally.  No crackles or wheezes.  CVS: S1 &S2 heard. No murmur.  Regular rate and rhythm. Abdomen: Hypoactive, midline laparotomy scar, colostomy bag in place empty, tenderness on minimal touch.  JP drain in place. Extremities: No cyanosis, clubbing or edema.  Peripheral pulses are palpable. Psych: Alert, awake and oriented, normal mood CNS:  No cranial nerve deficits.  Power equal in all extremities.   Skin: Warm and dry.  No rashes noted.  Data Reviewed: I have reviewed the labs and imaging studies as below  CBC: Recent Labs  Lab 01/30/20 0603 01/31/20 0519 02/01/20 0535   WBC 7.5 7.1 9.7  NEUTROABS 5.6  --   --   HGB 12.1 11.3* 12.4  HCT 38.0 35.9* 39.0  MCV 84.3 85.5 84.8  PLT 280 283 833   Basic Metabolic Panel: Recent Labs  Lab 01/26/20 1125 01/26/20 1125 01/27/20 0536 01/28/20 0524 01/30/20 0603 01/31/20 0519 02/01/20 0535  NA 138   < > 138 137 134* 136 135  K 3.5   < > 3.8 3.7 3.0* 3.7 3.7  CL 99   < > 99 96* 90* 92* 95*  CO2 28   < > 28 28 30 30 24   GLUCOSE 125*   < > 110* 114* 96 84 98  BUN 13   < > 11 11 10 7 7   CREATININE 0.62   < > 0.61 0.62 0.58 0.61 0.64  CALCIUM 8.6*   < > 8.7* 8.5* 8.4* 8.3* 7.9*  MG 2.1  --   --  2.1 2.3 2.4 2.2  PHOS  --   --   --   --  3.0 2.8 3.4   < > = values in this interval not displayed.   GFR: Estimated Creatinine Clearance: 105.6 mL/min (by C-G formula based on SCr of 0.64 mg/dL). Liver Function Tests: Recent Labs  Lab 01/30/20 0603 01/31/20 0519 02/01/20 0535  AST 37 40 30  ALT 62* 62* 50*  ALKPHOS 82 79 62  BILITOT 1.3* 1.1 1.6*  PROT 7.4 6.8 6.4*  ALBUMIN 3.8 3.4* 3.3*   No results for input(s): LIPASE, AMYLASE in the last 168 hours. No results for input(s): AMMONIA in the last 168 hours. Coagulation Profile: No results for input(s): INR, PROTIME in the last 168 hours. Cardiac Enzymes: No results for input(s): CKTOTAL, CKMB, CKMBINDEX, TROPONINI in the last 168 hours. BNP (last 3 results) No results for input(s): PROBNP in the last 8760 hours. HbA1C: No results for input(s): HGBA1C in the last 72 hours. CBG: Recent Labs  Lab 01/31/20 0718 01/31/20 2003 02/01/20 0006 02/01/20 0424 02/01/20 0638  GLUCAP 71 107* 114* 97 110*   Lipid Profile: No results for input(s): CHOL, HDL, LDLCALC, TRIG, CHOLHDL, LDLDIRECT in the last 72 hours. Thyroid Function Tests: No results for input(s): TSH, T4TOTAL, FREET4, T3FREE, THYROIDAB in the last 72 hours. Anemia Panel: No results for input(s): VITAMINB12, FOLATE, FERRITIN, TIBC, IRON, RETICCTPCT in the last 72 hours. Sepsis  Labs: Recent Labs  Lab 01/25/20 8250  LATICACIDVEN 1.9    Recent Results (from the past 240 hour(s))  Resp Panel by RT-PCR (Flu A&B, Covid) Nasopharyngeal Swab     Status: None   Collection Time: 01/24/20  7:24 PM   Specimen: Nasopharyngeal Swab; Nasopharyngeal(NP) swabs in vial transport medium  Result Value Ref Range Status   SARS Coronavirus 2 by RT PCR NEGATIVE NEGATIVE Final    Comment: (NOTE) SARS-CoV-2 target nucleic acids are NOT DETECTED.  The SARS-CoV-2 RNA is generally detectable in  upper respiratory specimens during the acute phase of infection. The lowest concentration of SARS-CoV-2 viral copies this assay can detect is 138 copies/mL. A negative result does not preclude SARS-Cov-2 infection and should not be used as the sole basis for treatment or other patient management decisions. A negative result may occur with  improper specimen collection/handling, submission of specimen other than nasopharyngeal swab, presence of viral mutation(s) within the areas targeted by this assay, and inadequate number of viral copies(<138 copies/mL). A negative result must be combined with clinical observations, patient history, and epidemiological information. The expected result is Negative.  Fact Sheet for Patients:  EntrepreneurPulse.com.au  Fact Sheet for Healthcare Providers:  IncredibleEmployment.be  This test is no t yet approved or cleared by the [place] FDA and  has been authorized for detection and/or diagnosis of SARS-CoV-2 by FDA under an Emergency Use Authorization (EUA). This EUA will remain  in effect (meaning this test can be used) for the duration of the COVID-19 declaration under Section 564(b)(1) of the Act, 21 U.S.C.section 360bbb-3(b)(1), unless the authorization is terminated  or revoked sooner.       Influenza A by PCR NEGATIVE NEGATIVE Final   Influenza B by PCR NEGATIVE NEGATIVE Final    Comment: (NOTE) The  Xpert Xpress SARS-CoV-2/FLU/RSV plus assay is intended as an aid in the diagnosis of influenza from Nasopharyngeal swab specimens and should not be used as a sole basis for treatment. Nasal washings and aspirates are unacceptable for Xpert Xpress SARS-CoV-2/FLU/RSV testing.  Fact Sheet for Patients: EntrepreneurPulse.com.au  Fact Sheet for Healthcare Providers: IncredibleEmployment.be  This test is not yet approved or cleared by the [place] FDA and has been authorized for detection and/or diagnosis of SARS-CoV-2 by FDA under an Emergency Use Authorization (EUA). This EUA will remain in effect (meaning this test can be used) for the duration of the COVID-19 declaration under Section 564(b)(1) of the Act, 21 U.S.C. section 360bbb-3(b)(1), unless the authorization is terminated or revoked.  Performed at [facility], [address] [place], [place] [address]   Surgical pcr screen     Status: None   Collection Time: 01/31/20 10:28 AM   Specimen: Nasal Mucosa; Nasal Swab  Result Value Ref Range Status   MRSA, PCR NEGATIVE NEGATIVE Final   Staphylococcus aureus NEGATIVE NEGATIVE Final    Comment: (NOTE) The Xpert SA Assay (FDA approved for NASAL specimens in patients 15 years of age and older), is one component of a comprehensive surveillance program. It is not intended to diagnose infection nor to guide or monitor treatment. Performed at [facility], [place] [address], [place], [place] [address]        Radiology Studies: DG Abd 1 View  Result Date: 01/31/2020 CLINICAL DATA:  Nasogastric tube placement. EXAM: ABDOMEN - 1 VIEW COMPARISON:  January 31, 2020 (5:18 p.m.) FINDINGS: A nasogastric tube is seen with its distal tip overlying the body of the stomach. This is stable in position when compared to the prior study. The bowel gas pattern is normal. No radio-opaque calculi or other significant radiographic  abnormality are seen. Radiopaque surgical clips are seen overlying the right upper quadrant. IMPRESSION: Nasogastric tube positioning, as described above. Electronically Signed   By: Virgina Norfolk M.D.   On: 01/31/2020 20:40   X-ray abdomen AP  Result Date: 01/31/2020 CLINICAL DATA:  Status post nasogastric tube placement. EXAM: ABDOMEN - 1 VIEW COMPARISON:  January 30, 2020. FINDINGS: The bowel gas pattern is normal. Distal  tip of nasogastric tube is seen in proximal small bowel. No radio-opaque calculi or other significant radiographic abnormality are seen. IMPRESSION: Distal tip of nasogastric tube seen in proximal small bowel. Electronically Signed   By: Marijo Conception M.D.   On: 01/31/2020 17:55   DG Abd 2 Views  Result Date: 01/30/2020 CLINICAL DATA:  Constipation.  Small bowel obstruction. EXAM: ABDOMEN - 2 VIEW COMPARISON:  January 27, 2020 FINDINGS: A mildly dilated loop of small bowel seen in the left lateral abdomen with apparent associated wall thickening. An air-fluid level is identified in these loops of bowel on the upright imaging. There is contrast in the ascending colon and air in the descending colon. An NG tube terminates in the region of the distal stomach or proximal duodenum. No other acute abnormalities. No free air, portal venous gas, or pneumatosis. IMPRESSION: Continued small bowel obstruction. A dilated loop of small bowel left abdomen appears to demonstrate some wall thickening. The NG tube distal tip is either in the distal stomach or proximal duodenum. Electronically Signed   By: Dorise Bullion III M.D   On: 01/30/2020 13:25     Scheduled Meds: . enoxaparin (LOVENOX) injection  40 mg Subcutaneous Q24H  . insulin aspart  0-9 Units Subcutaneous Q6H   Continuous Infusions: . acetaminophen 1,000 mg (02/01/20 0551)  . lactated ringers 125 mL/hr at 01/31/20 1900  . methocarbamol (ROBAXIN) IV 1,000 mg (02/01/20 9791)     LOS: 8 days    Flora Lipps, MD [facility] 02/01/2020

## 2020-02-01 NOTE — Progress Notes (Signed)
Progress Note: General Surgery Service   Chief Complaint/Subjective: Abdominal pain around incisions and abdominal wall.  No bowel function.  No nausea/vomiting with NG to suction.   Objective: Vital signs in last 24 hours: Temp:  [97.9 F (36.6 C)-98.5 F (36.9 C)] 97.9 F (36.6 C) (12/07 0623) Pulse Rate:  [62-109] 109 (12/07 0623) Resp:  [11-24] 17 (12/07 0623) BP: (123-191)/(64-107) 125/86 (12/07 0623) SpO2:  [92 %-100 %] 98 % (12/07 0623) Weight:  [124.7 kg] 124.7 kg (12/06 1048) Last BM Date: 01/29/20  Intake/Output from previous day: 12/06 0701 - 12/07 0700 In: 4128.8 [P.O.:60; I.V.:3310.8; IV Piggyback:758] Out: 3158 [Urine:2175; Emesis/NG output:745; Drains:138; Blood:100] Intake/Output this shift: No intake/output data recorded.  Gen: no distress, pleasant  Resp: Bilateral breath sounds, no increased work of breathing  Card: Regular rate and rhythm  Abd: NG to suction with brown output, abdominal incision c/d/i w/ glue, JP drain with serosanguinous output, tender around incisions  Lab Results: CBC  Recent Labs    01/31/20 0519 02/01/20 0535  WBC 7.1 9.7  HGB 11.3* 12.4  HCT 35.9* 39.0  PLT 283 351   BMET Recent Labs    01/31/20 0519 02/01/20 0535  NA 136 135  K 3.7 3.7  CL 92* 95*  CO2 30 24  GLUCOSE 84 98  BUN 7 7  CREATININE 0.61 0.64  CALCIUM 8.3* 7.9*   PT/INR No results for input(s): LABPROT, INR in the last 72 hours. ABG No results for input(s): PHART, HCO3 in the last 72 hours.  Invalid input(s): PCO2, PO2  Anti-infectives: Anti-infectives (From admission, onward)   Start     Dose/Rate Route Frequency Ordered Stop   01/31/20 1045  ceFAZolin (ANCEF) IVPB 2g/100 mL premix        2 g 200 mL/hr over 30 Minutes Intravenous  Once 01/31/20 0921 01/31/20 1236      Medications: Scheduled Meds: . enoxaparin (LOVENOX) injection  40 mg Subcutaneous Q24H  . insulin aspart  0-9 Units Subcutaneous Q6H   Continuous Infusions: .  acetaminophen 1,000 mg (02/01/20 0551)  . lactated ringers 125 mL/hr at 01/31/20 1900  . methocarbamol (ROBAXIN) IV 1,000 mg (02/01/20 2706)   PRN Meds:.albuterol, alum & mag hydroxide-simeth, hydrALAZINE, HYDROmorphone (DILAUDID) injection, ondansetron **OR** ondansetron (ZOFRAN) IV, phenol  Assessment/Plan: Ms. Rew is a 54 year old female who presented on 11/29 with a bowel obstruction and parastomal hernia, now s/p modified retromuscular sugarbaker repair of parastomal and ventral hernia with 25 x 30cm Bard Soft mesh.  Parastomal and Ventral incisional hernias s/p repair as above Small bowel obstruction 2/2 hernias s/p repair as above - Continue NPO with NG tube - Okay for sips of liquids, hard candy, gum - IVF - Monitor labs - Multimodal pain control - Remove foley tomorrow once more mobile  PCOS, HTN, Asthma - home meds  FEN: IVF, anticipate starting diet within a few days ID: None VTE: Lovenox Foley: Continue today, plan to remove tomorrow AM Dispo: Continued care on the floor, anticipate discharge home in 3-5 days    LOS: 8 days   Felicie Morn, MD [facility]

## 2020-02-01 NOTE — Anesthesia Postprocedure Evaluation (Signed)
Anesthesia Post Note  Patient: Cindy Peterson  Procedure(s) Performed: OPEN REPAIR OF A PARASTOMAL HERNIA WITH MESH WITH LYSIS OF ADHESIONS (N/A Abdomen)     Patient location during evaluation: PACU Anesthesia Type: General Level of consciousness: awake and alert Pain management: pain level controlled Vital Signs Assessment: post-procedure vital signs reviewed and stable Respiratory status: spontaneous breathing, nonlabored ventilation, respiratory function stable and patient connected to nasal cannula oxygen Cardiovascular status: blood pressure returned to baseline and stable Postop Assessment: no apparent nausea or vomiting Anesthetic complications: no   No complications documented.  Last Vitals:  Vitals:   02/01/20 0623 02/01/20 1040  BP: 125/86 133/79  Pulse: (!) 109 (!) 110  Resp: 17 16  Temp: 36.6 C 36.7 C  SpO2: 98% 98%    Last Pain:  Vitals:   02/01/20 1040  TempSrc: Oral  PainSc:                  Tiajuana Amass

## 2020-02-02 DIAGNOSIS — E282 Polycystic ovarian syndrome: Secondary | ICD-10-CM

## 2020-02-02 LAB — GLUCOSE, CAPILLARY
Glucose-Capillary: 111 mg/dL — ABNORMAL HIGH (ref 70–99)
Glucose-Capillary: 92 mg/dL (ref 70–99)
Glucose-Capillary: 94 mg/dL (ref 70–99)
Glucose-Capillary: 98 mg/dL (ref 70–99)
Glucose-Capillary: 99 mg/dL (ref 70–99)

## 2020-02-02 LAB — COMPREHENSIVE METABOLIC PANEL
ALT: 36 U/L (ref 0–44)
AST: 21 U/L (ref 15–41)
Albumin: 3 g/dL — ABNORMAL LOW (ref 3.5–5.0)
Alkaline Phosphatase: 71 U/L (ref 38–126)
Anion gap: 15 (ref 5–15)
BUN: 7 mg/dL (ref 6–20)
CO2: 22 mmol/L (ref 22–32)
Calcium: 7.9 mg/dL — ABNORMAL LOW (ref 8.9–10.3)
Chloride: 95 mmol/L — ABNORMAL LOW (ref 98–111)
Creatinine, Ser: 0.66 mg/dL (ref 0.44–1.00)
GFR, Estimated: >60 mL/min (ref >60)
Glucose, Bld: 82 mg/dL (ref 70–99)
Potassium: 3.7 mmol/L (ref 3.5–5.1)
Sodium: 132 mmol/L — ABNORMAL LOW (ref 135–145)
Total Bilirubin: 1.5 mg/dL — ABNORMAL HIGH (ref 0.3–1.2)
Total Protein: 6.4 g/dL — ABNORMAL LOW (ref 6.5–8.1)

## 2020-02-02 LAB — PHOSPHORUS
Phosphorus: 2.4 mg/dL — ABNORMAL LOW (ref 2.5–4.6)
Phosphorus: 2.5 mg/dL (ref 2.5–4.6)

## 2020-02-02 LAB — CBC
HCT: 36.2 % (ref 36.0–46.0)
Hemoglobin: 11.3 g/dL — ABNORMAL LOW (ref 12.0–15.0)
MCH: 26.8 pg (ref 26.0–34.0)
MCHC: 31.2 g/dL (ref 30.0–36.0)
MCV: 86 fL (ref 80.0–100.0)
Platelets: 336 10*3/uL (ref 150–400)
RBC: 4.21 MIL/uL (ref 3.87–5.11)
RDW: 15.6 % — ABNORMAL HIGH (ref 11.5–15.5)
WBC: 11.6 10*3/uL — ABNORMAL HIGH (ref 4.0–10.5)
nRBC: 0 % (ref 0.0–0.2)

## 2020-02-02 LAB — MAGNESIUM: Magnesium: 2.3 mg/dL (ref 1.7–2.4)

## 2020-02-02 MED ORDER — POTASSIUM CHLORIDE 20 MEQ PO PACK
20.0000 meq | PACK | Freq: Once | ORAL | Status: AC
  Administered 2020-02-02: 20 meq
  Filled 2020-02-02: qty 1

## 2020-02-02 MED ORDER — KCL IN DEXTROSE-NACL 20-5-0.45 MEQ/L-%-% IV SOLN
INTRAVENOUS | Status: DC
  Filled 2020-02-02 (×3): qty 1000

## 2020-02-02 NOTE — Progress Notes (Signed)
PROGRESS NOTE    Cindy Peterson  LOV:[id number] DOB: Oct 09, 1965 DOA: 01/24/2020 PCP: Karlene Einstein, MD     Brief Narrative:  54 years BF PMHx Colon cancer s/p colectomy, colostomy and parastomal hernia, recurrent large bowel obstruction, DM type II controlled, essential HTN, PCOS  Presented to the emergency room with abdominal pain and poor output from ostomy.  Patient was then admitted to hospital for treatment of bowel obstruction.  Patient was initially conservatively treated but her symptoms did not improve so patient underwent surgical intervention on 01/31/2020.    Subjective: A/O x4, appropriately tender for abdominal surgery on 12/6   Assessment & Plan: Covid vaccination; vaccinated   Principal Problem:   Small bowel obstruction ([place]) Active Problems:   Parastomal hernia   Essential hypertension   Acid reflux   Morbid obesity with BMI of 45.0-49.9, adult (HCC)   PCOS (polycystic ovarian syndrome)   Primary insomnia   Colostomy in place [facility])   Constipation, chronic   Partial small bowel obstruction/parastomal hernia/ileus with constipation: Patient was treated conservatively initially but did not improve and Failed NG tube clamping trial with significant NG output and abdominal discomfort.  Patient subsequently underwent.exploratory laparotomy with parastomal hernia repair with mesh on 01/31/2020.  Postoperative management as per general surgery  Essential hypertension: . -Hydralazine 10 mgPRN  DM type II controlled  -On Metformin at home.   -Sensitive SSI      DVT prophylaxis: Lovenox Code Status: Full Family Communication:  Status is: Inpatient    Dispo: The patient is from: Home              Anticipated d/c is to: Home              Anticipated d/c date is: Per surgery              Patient currently unstable      Consultants:  General surgery  Procedures/Significant Events:    I have personally reviewed and interpreted all radiology  studies and my findings are as above.  VENTILATOR SETTINGS:    Cultures   Antimicrobials: Anti-infectives (From admission, onward)   Start     Dose/Rate Route Frequency Ordered Stop   01/31/20 1045  ceFAZolin (ANCEF) IVPB 2g/100 mL premix        2 g 200 mL/hr over 30 Minutes Intravenous  Once 01/31/20 0921 01/31/20 1236       Devices    LINES / TUBES:      Continuous Infusions: . dextrose 5 % and 0.45 % NaCl with KCl 20 mEq/L    . methocarbamol (ROBAXIN) IV 1,000 mg (02/02/20 0553)     Objective: Vitals:   02/01/20 1414 02/01/20 1812 02/01/20 2234 02/02/20 0613  BP: (!) 146/76 126/81 125/82 116/77  Pulse: (!) 107 99 100 84  Resp: 18 16 18 17   Temp: 97.9 F (36.6 C) 98.7 F (37.1 C) 99.2 F (37.3 C) 98.8 F (37.1 C)  TempSrc: Oral Oral Oral Oral  SpO2: 98% 99% 99% 100%  Weight:      Height:        Intake/Output Summary (Last 24 hours) at 02/02/2020 0840 Last data filed at 02/02/2020 8841 Gross per 24 hour  Intake 0 ml  Output 2085 ml  Net -2085 ml   Filed Weights   01/24/20 1655 01/31/20 1048  Weight: 122.5 kg 124.7 kg    Examination:  General: A/O x4, No acute respiratory distress Eyes: negative scleral hemorrhage, negative anisocoria, negative icterus ENT: Negative  Runny nose, negative gingival bleeding, Neck:  Negative scars, masses, torticollis, lymphadenopathy, JVD Lungs: Clear to auscultation bilaterally without wheezes or crackles Cardiovascular: Regular rate and rhythm without murmur gallop or rub normal S1 and S2 Abdomen: Positive appropriate tenderness abdominal pain, positive distended, negative soft, bowel sounds, no rebound, no ascites, no appreciable mass Extremities: No significant cyanosis, clubbing, or edema bilateral lower extremities Skin: Negative rashes, lesions, ulcers Psychiatric:  Negative depression, negative anxiety, negative fatigue, negative mania  Central nervous system:  Cranial nerves II through XII intact,  tongue/uvula midline, all extremities muscle strength 5/5, sensation intact throughout, negative dysarthria, negative expressive aphasia, negative receptive aphasia.  .     Data Reviewed: Care during the described time interval was provided by me .  I have reviewed this patient's available data, including medical history, events of note, physical examination, and all test results as part of my evaluation.  CBC: Recent Labs  Lab 01/30/20 0603 01/31/20 0519 02/01/20 0535 02/02/20 0547  WBC 7.5 7.1 9.7 11.6*  NEUTROABS 5.6  --   --   --   HGB 12.1 11.3* 12.4 11.3*  HCT 38.0 35.9* 39.0 36.2  MCV 84.3 85.5 84.8 86.0  PLT 280 283 351 443   Basic Metabolic Panel: Recent Labs  Lab 01/28/20 0524 01/30/20 0603 01/31/20 0519 02/01/20 0535 02/02/20 0547  NA 137 134* 136 135 132*  K 3.7 3.0* 3.7 3.7 3.7  CL 96* 90* 92* 95* 95*  CO2 28 30 30 24 22   GLUCOSE 114* 96 84 98 82  BUN 11 10 7 7 7   CREATININE 0.62 0.58 0.61 0.64 0.66  CALCIUM 8.5* 8.4* 8.3* 7.9* 7.9*  MG 2.1 2.3 2.4 2.2 2.3  PHOS  --  3.0 2.8 3.4 2.4*   GFR: Estimated Creatinine Clearance: 105.6 mL/min (by C-G formula based on SCr of 0.66 mg/dL). Liver Function Tests: Recent Labs  Lab 01/30/20 0603 01/31/20 0519 02/01/20 0535 02/02/20 0547  AST 37 40 30 21  ALT 62* 62* 50* 36  ALKPHOS 82 79 62 71  BILITOT 1.3* 1.1 1.6* 1.5*  PROT 7.4 6.8 6.4* 6.4*  ALBUMIN 3.8 3.4* 3.3* 3.0*   No results for input(s): LIPASE, AMYLASE in the last 168 hours. No results for input(s): AMMONIA in the last 168 hours. Coagulation Profile: No results for input(s): INR, PROTIME in the last 168 hours. Cardiac Enzymes: No results for input(s): CKTOTAL, CKMB, CKMBINDEX, TROPONINI in the last 168 hours. BNP (last 3 results) No results for input(s): PROBNP in the last 8760 hours. HbA1C: No results for input(s): HGBA1C in the last 72 hours. CBG: Recent Labs  Lab 02/01/20 0638 02/01/20 1219 02/01/20 1814 02/02/20 0006  02/02/20 0625  GLUCAP 110* 85 84 111* 94   Lipid Profile: No results for input(s): CHOL, HDL, LDLCALC, TRIG, CHOLHDL, LDLDIRECT in the last 72 hours. Thyroid Function Tests: No results for input(s): TSH, T4TOTAL, FREET4, T3FREE, THYROIDAB in the last 72 hours. Anemia Panel: No results for input(s): VITAMINB12, FOLATE, FERRITIN, TIBC, IRON, RETICCTPCT in the last 72 hours. Sepsis Labs: No results for input(s): PROCALCITON, LATICACIDVEN in the last 168 hours.  Recent Results (from the past 240 hour(s))  Resp Panel by RT-PCR (Flu A&B, Covid) Nasopharyngeal Swab     Status: None   Collection Time: 01/24/20  7:24 PM   Specimen: Nasopharyngeal Swab; Nasopharyngeal(NP) swabs in vial transport medium  Result Value Ref Range Status   SARS Coronavirus 2 by RT PCR NEGATIVE NEGATIVE Final    Comment: (NOTE) SARS-CoV-2  target nucleic acids are NOT DETECTED.  The SARS-CoV-2 RNA is generally detectable in upper respiratory specimens during the acute phase of infection. The lowest concentration of SARS-CoV-2 viral copies this assay can detect is 138 copies/mL. A negative result does not preclude SARS-Cov-2 infection and should not be used as the sole basis for treatment or other patient management decisions. A negative result may occur with  improper specimen collection/handling, submission of specimen other than nasopharyngeal swab, presence of viral mutation(s) within the areas targeted by this assay, and inadequate number of viral copies(<138 copies/mL). A negative result must be combined with clinical observations, patient history, and epidemiological information. The expected result is Negative.  Fact Sheet for Patients:  EntrepreneurPulse.com.au  Fact Sheet for Healthcare Providers:  IncredibleEmployment.be  This test is no t yet approved or cleared by the [place] FDA and  has been authorized for detection and/or diagnosis of SARS-CoV-2 by FDA  under an Emergency Use Authorization (EUA). This EUA will remain  in effect (meaning this test can be used) for the duration of the COVID-19 declaration under Section 564(b)(1) of the Act, 21 U.S.C.section 360bbb-3(b)(1), unless the authorization is terminated  or revoked sooner.       Influenza A by PCR NEGATIVE NEGATIVE Final   Influenza B by PCR NEGATIVE NEGATIVE Final    Comment: (NOTE) The Xpert Xpress SARS-CoV-2/FLU/RSV plus assay is intended as an aid in the diagnosis of influenza from Nasopharyngeal swab specimens and should not be used as a sole basis for treatment. Nasal washings and aspirates are unacceptable for Xpert Xpress SARS-CoV-2/FLU/RSV testing.  Fact Sheet for Patients: EntrepreneurPulse.com.au  Fact Sheet for Healthcare Providers: IncredibleEmployment.be  This test is not yet approved or cleared by the [place] FDA and has been authorized for detection and/or diagnosis of SARS-CoV-2 by FDA under an Emergency Use Authorization (EUA). This EUA will remain in effect (meaning this test can be used) for the duration of the COVID-19 declaration under Section 564(b)(1) of the Act, 21 U.S.C. section 360bbb-3(b)(1), unless the authorization is terminated or revoked.  Performed at [facility], [address], [place], [place] [address]   Surgical pcr screen     Status: None   Collection Time: 01/31/20 10:28 AM   Specimen: Nasal Mucosa; Nasal Swab  Result Value Ref Range Status   MRSA, PCR NEGATIVE NEGATIVE Final   Staphylococcus aureus NEGATIVE NEGATIVE Final    Comment: (NOTE) The Xpert SA Assay (FDA approved for NASAL specimens in patients 63 years of age and older), is one component of a comprehensive surveillance program. It is not intended to diagnose infection nor to guide or monitor treatment. Performed at [facility], [place] [address] [place],  [address]           Radiology Studies: DG Abd 1 View  Result Date: 01/31/2020 CLINICAL DATA:  Nasogastric tube placement. EXAM: ABDOMEN - 1 VIEW COMPARISON:  January 31, 2020 (5:18 p.m.) FINDINGS: A nasogastric tube is seen with its distal tip overlying the body of the stomach. This is stable in position when compared to the prior study. The bowel gas pattern is normal. No radio-opaque calculi or other significant radiographic abnormality are seen. Radiopaque surgical clips are seen overlying the right upper quadrant. IMPRESSION: Nasogastric tube positioning, as described above. Electronically Signed   By: Virgina Norfolk M.D.   On: 01/31/2020 20:40   X-ray abdomen AP  Result Date: 01/31/2020 CLINICAL DATA:  Status post nasogastric tube placement. EXAM: ABDOMEN -  1 VIEW COMPARISON:  January 30, 2020. FINDINGS: The bowel gas pattern is normal. Distal tip of nasogastric tube is seen in proximal small bowel. No radio-opaque calculi or other significant radiographic abnormality are seen. IMPRESSION: Distal tip of nasogastric tube seen in proximal small bowel. Electronically Signed   By: Marijo Conception M.D.   On: 01/31/2020 17:55        Scheduled Meds: . enoxaparin (LOVENOX) injection  40 mg Subcutaneous Q24H  . gabapentin  300 mg Oral TID  . insulin aspart  0-9 Units Subcutaneous Q6H  . potassium chloride  20 mEq Per Tube Once   Continuous Infusions: . dextrose 5 % and 0.45 % NaCl with KCl 20 mEq/L    . methocarbamol (ROBAXIN) IV 1,000 mg (02/02/20 0553)     LOS: 9 days    Time spent:40 min    Elia Keenum, Geraldo Docker, MD [facility] Pager [phone]  If 7PM-7AM, please contact night-coverage [email or web address] Password [email or web address] 02/02/2020, 8:40 AM

## 2020-02-02 NOTE — Progress Notes (Signed)
Patient pulled out NGT, MD paged and stated that we could leave out tonight and see how she does.

## 2020-02-02 NOTE — Progress Notes (Signed)
Progress Note: General Surgery Service   Chief Complaint/Subjective: Continued abdominal pain.  Up to the side of the bed yesterday, hasn't been walking in the hallway yet.  Starting to work on IS this morning.   Objective: Vital signs in last 24 hours: Temp:  [97.9 F (36.6 C)-99.2 F (37.3 C)] 98.8 F (37.1 C) (12/08 0613) Pulse Rate:  [84-110] 84 (12/08 0613) Resp:  [16-18] 17 (12/08 0613) BP: (116-146)/(76-82) 116/77 (12/08 0613) SpO2:  [98 %-100 %] 100 % (12/08 0613) Last BM Date: 01/29/20  Intake/Output from previous day: 12/07 0701 - 12/08 0700 In: 0  Out: 2085 [Urine:1125; Emesis/NG output:900; Drains:60] Intake/Output this shift: No intake/output data recorded.  Gen: no distress, pleasant  Resp: Bilateral breath sounds, no increased work of breathing  Card: Regular rate and rhythm  Abd: NG to suction with brown output, abdominal incision c/d/i w/ glue, JP drain with serosanguinous output, tender around incisions  Lab Results: CBC  Recent Labs    02/01/20 0535 02/02/20 0547  WBC 9.7 11.6*  HGB 12.4 11.3*  HCT 39.0 36.2  PLT 351 336   BMET Recent Labs    02/01/20 0535 02/02/20 0547  NA 135 132*  K 3.7 3.7  CL 95* 95*  CO2 24 22  GLUCOSE 98 82  BUN 7 7  CREATININE 0.64 0.66  CALCIUM 7.9* 7.9*   PT/INR No results for input(s): LABPROT, INR in the last 72 hours. ABG No results for input(s): PHART, HCO3 in the last 72 hours.  Invalid input(s): PCO2, PO2  Anti-infectives: Anti-infectives (From admission, onward)   Start     Dose/Rate Route Frequency Ordered Stop   01/31/20 1045  ceFAZolin (ANCEF) IVPB 2g/100 mL premix        2 g 200 mL/hr over 30 Minutes Intravenous  Once 01/31/20 0921 01/31/20 1236      Medications: Scheduled Meds: . enoxaparin (LOVENOX) injection  40 mg Subcutaneous Q24H  . gabapentin  300 mg Oral TID  . insulin aspart  0-9 Units Subcutaneous Q6H  . potassium chloride  20 mEq Per Tube Once   Continuous Infusions: .  dextrose 5 % and 0.45 % NaCl with KCl 20 mEq/L    . methocarbamol (ROBAXIN) IV 1,000 mg (02/02/20 0553)   PRN Meds:.albuterol, alum & mag hydroxide-simeth, hydrALAZINE, HYDROmorphone (DILAUDID) injection, ondansetron **OR** ondansetron (ZOFRAN) IV, phenol  Assessment/Plan: Ms. Glick is a 54 year old female who presented on 11/29 with a bowel obstruction and parastomal hernia, now s/p modified retromuscular sugarbaker repair of parastomal and ventral hernia with 25 x 30cm Bard Soft mesh.  Parastomal and ventral incisional hernias s/p repair as above Small bowel obstruction 2/2 hernias s/p repair as above - Continue NPO with NG tube - Maintenance IVF - Okay for sips of liquids, hard candy, gum - Monitor labs, replace potassium - Multimodal pain control - Remove foley  PCOS, HTN, Asthma - home meds  FEN: Maintenance IVF, anticipate starting diet within a day or two ID: None VTE: Lovenox Foley: remove foley Dispo: Continued care on the floor, anticipate discharge home in 2-4 days    LOS: 9 days   Felicie Morn, MD [facility]

## 2020-02-03 LAB — GLUCOSE, CAPILLARY
Glucose-Capillary: 114 mg/dL — ABNORMAL HIGH (ref 70–99)
Glucose-Capillary: 118 mg/dL — ABNORMAL HIGH (ref 70–99)
Glucose-Capillary: 124 mg/dL — ABNORMAL HIGH (ref 70–99)
Glucose-Capillary: 125 mg/dL — ABNORMAL HIGH (ref 70–99)
Glucose-Capillary: 143 mg/dL — ABNORMAL HIGH (ref 70–99)
Glucose-Capillary: 87 mg/dL (ref 70–99)

## 2020-02-03 LAB — COMPREHENSIVE METABOLIC PANEL
ALT: 26 U/L (ref 0–44)
AST: 15 U/L (ref 15–41)
Albumin: 2.8 g/dL — ABNORMAL LOW (ref 3.5–5.0)
Alkaline Phosphatase: 61 U/L (ref 38–126)
Anion gap: 12 (ref 5–15)
BUN: 5 mg/dL — ABNORMAL LOW (ref 6–20)
CO2: 24 mmol/L (ref 22–32)
Calcium: 7.8 mg/dL — ABNORMAL LOW (ref 8.9–10.3)
Chloride: 98 mmol/L (ref 98–111)
Creatinine, Ser: 0.53 mg/dL (ref 0.44–1.00)
GFR, Estimated: >60 mL/min (ref >60)
Glucose, Bld: 146 mg/dL — ABNORMAL HIGH (ref 70–99)
Potassium: 3.4 mmol/L — ABNORMAL LOW (ref 3.5–5.1)
Sodium: 134 mmol/L — ABNORMAL LOW (ref 135–145)
Total Bilirubin: 1.1 mg/dL (ref 0.3–1.2)
Total Protein: 6 g/dL — ABNORMAL LOW (ref 6.5–8.1)

## 2020-02-03 LAB — POTASSIUM: Potassium: 3.8 mmol/L (ref 3.5–5.1)

## 2020-02-03 LAB — MAGNESIUM
Magnesium: 2 mg/dL (ref 1.7–2.4)
Magnesium: 1.9 mg/dL (ref 1.7–2.4)

## 2020-02-03 LAB — CBC WITH DIFFERENTIAL/PLATELET
Abs Immature Granulocytes: 0.14 10*3/uL — ABNORMAL HIGH (ref 0.00–0.07)
Basophils Absolute: 0 10*3/uL (ref 0.0–0.1)
Basophils Relative: 0 %
Eosinophils Absolute: 0.2 10*3/uL (ref 0.0–0.5)
Eosinophils Relative: 2 %
HCT: 33.2 % — ABNORMAL LOW (ref 36.0–46.0)
Hemoglobin: 10.4 g/dL — ABNORMAL LOW (ref 12.0–15.0)
Immature Granulocytes: 1 %
Lymphocytes Relative: 14 %
Lymphs Abs: 1.5 10*3/uL (ref 0.7–4.0)
MCH: 26.5 pg (ref 26.0–34.0)
MCHC: 31.3 g/dL (ref 30.0–36.0)
MCV: 84.7 fL (ref 80.0–100.0)
Monocytes Absolute: 0.8 10*3/uL (ref 0.1–1.0)
Monocytes Relative: 8 %
Neutro Abs: 8.2 10*3/uL — ABNORMAL HIGH (ref 1.7–7.7)
Neutrophils Relative %: 75 %
Platelets: 287 10*3/uL (ref 150–400)
RBC: 3.92 MIL/uL (ref 3.87–5.11)
RDW: 15.5 % (ref 11.5–15.5)
WBC: 11 10*3/uL — ABNORMAL HIGH (ref 4.0–10.5)
nRBC: 0 % (ref 0.0–0.2)

## 2020-02-03 MED ORDER — POTASSIUM CHLORIDE CRYS ER 20 MEQ PO TBCR
40.0000 meq | EXTENDED_RELEASE_TABLET | Freq: Two times a day (BID) | ORAL | Status: AC
  Administered 2020-02-03 (×2): 40 meq via ORAL
  Filled 2020-02-03 (×2): qty 2

## 2020-02-03 MED ORDER — ENSURE SURGERY PO LIQD
237.0000 mL | Freq: Two times a day (BID) | ORAL | Status: DC
  Administered 2020-02-03 – 2020-02-07 (×3): 237 mL via ORAL

## 2020-02-03 NOTE — Evaluation (Signed)
Physical Therapy Evaluation Patient Details Name: Cindy Peterson MRN: [id number] DOB: 05/06/1965 Today's Date: 02/03/2020   History of Present Illness  Pt admitted with SBO and now s/p parasomal and ventral hernia repair.  Pt with hx of colon CA  Clinical Impression  Pt admitted as above and presenting with functional mobility limitations 2* post op abdominal pain and ambulatory balance deficits.  Pt should progress well to dc home with family assist.    Follow Up Recommendations No PT follow up    Equipment Recommendations  Other (comment) (TBD)    Recommendations for Other Services       Precautions / Restrictions Precautions Precautions: Fall;Other (comment) Precaution Comments: JP drain on R Restrictions Weight Bearing Restrictions: No      Mobility  Bed Mobility Overal bed mobility: Needs Assistance Bed Mobility: Rolling;Sidelying to Sit Rolling: Min guard Sidelying to sit: Min assist       General bed mobility comments: cues for log roll technique; use of bed rail and min assist to move to upright sitting    Transfers Overall transfer level: Needs assistance Equipment used: Rolling walker (2 wheeled) Transfers: Sit to/from Stand Sit to Stand: Min assist         General transfer comment: steady assist; cues for use of UEs to self assist  Ambulation/Gait Ambulation/Gait assistance: Min assist;Min guard Gait Distance (Feet): 450 Feet Assistive device: Rolling walker (2 wheeled) Gait Pattern/deviations: Step-through pattern;Decreased step length - right;Decreased step length - left;Shuffle;Trunk flexed Gait velocity: decr   General Gait Details: cues for posture and position from RW; increased time with standing rest breaks required to complete task  Stairs            Wheelchair Mobility    Modified Rankin (Stroke Patients Only)       Balance Overall balance assessment: Mild deficits observed, not formally tested                                            Pertinent Vitals/Pain Pain Assessment: 0-10 Pain Score: 5  Pain Location: abdomen Pain Descriptors / Indicators: Guarding;Sore Pain Intervention(s): Limited activity within patient's tolerance;Monitored during session;Premedicated before session    [place] expects to be discharged to:: Private residence Living Arrangements: Other (Comment) (roomate) Available Help at Discharge: Family;Available 24 hours/day Type of Home: House Home Access: Stairs to enter Entrance Stairs-Rails: Right Entrance Stairs-Number of Steps: 3 Home Layout: Able to live on main level with bedroom/bathroom Home Equipment: None Additional Comments: Pt states will be staying with sister initially    Prior Function Level of Independence: Independent               Hand Dominance        Extremity/Trunk Assessment   Upper Extremity Assessment Upper Extremity Assessment: Overall WFL for tasks assessed    Lower Extremity Assessment Lower Extremity Assessment: Overall WFL for tasks assessed       Communication   Communication: No difficulties  Cognition Arousal/Alertness: Awake/alert Behavior During Therapy: WFL for tasks assessed/performed Overall Cognitive Status: Within Functional Limits for tasks assessed                                        General Comments      Exercises     Assessment/Plan  PT Assessment Patient needs continued PT services  PT Problem List Decreased activity tolerance;Decreased balance;Decreased mobility;Decreased knowledge of use of DME;Pain;Obesity       PT Treatment Interventions DME instruction;Gait training;Stair training;Functional mobility training;Therapeutic exercise;Therapeutic activities;Patient/family education    PT Goals (Current goals can be found in the Care Plan section)  Acute Rehab PT Goals Patient Stated Goal: walk independently PT Goal Formulation: With patient Time  For Goal Achievement: 02/17/20 Potential to Achieve Goals: Good    Frequency Min 3X/week   Barriers to discharge        Co-evaluation               AM-PAC PT "6 Clicks" Mobility  Outcome Measure Help needed turning from your back to your side while in a flat bed without using bedrails?: A Little Help needed moving from lying on your back to sitting on the side of a flat bed without using bedrails?: A Lot Help needed moving to and from a bed to a chair (including a wheelchair)?: A Little Help needed standing up from a chair using your arms (e.g., wheelchair or bedside chair)?: A Little Help needed to walk in hospital room?: A Little Help needed climbing 3-5 steps with a railing? : A Lot 6 Click Score: 16    End of Session   Activity Tolerance: Patient tolerated treatment well Patient left: in chair;with call bell/phone within reach;with chair alarm set Nurse Communication: Mobility status PT Visit Diagnosis: Unsteadiness on feet (R26.81);Difficulty in walking, not elsewhere classified (R26.2)    Time: 9450-3888 PT Time Calculation (min) (ACUTE ONLY): 30 min   Charges:   PT Evaluation $PT Eval Low Complexity: 1 Low PT Treatments $Gait Training: 8-22 mins        Debe Coder PT Acute Rehabilitation Services Pager [phone] Office [phone]   Nakeysha Pasqual 02/03/2020, 1:00 PM

## 2020-02-03 NOTE — Progress Notes (Signed)
Progress Note  3 Days Post-Op  Subjective: NGT came out overnight but patient denies nausea or feeling more bloated. Still awaiting return in bowel function. She reports pain with getting up and down but is generally comfortable at rest or once she has gotten up.   Objective: Vital signs in last 24 hours: Temp:  [98.1 F (36.7 C)-98.6 F (37 C)] 98.1 F (36.7 C) (12/09 0640) Pulse Rate:  [98-112] 102 (12/09 0640) Resp:  [16-18] 18 (12/09 0640) BP: (120-137)/(61-77) 120/61 (12/09 0640) SpO2:  [99 %-100 %] 100 % (12/09 0640) Last BM Date: 01/29/20  Intake/Output from previous day: 12/08 0701 - 12/09 0700 In: 786.5 [P.O.:9; I.V.:377.5; IV Piggyback:400] Out: 940 [Urine:850; Drains:90] Intake/Output this shift: No intake/output data recorded.  PE: General: pleasant, WD, morbidly obese female who is laying in bed in NAD Heart: regular, rate, and rhythm.  Lungs: Respiratory effort nonlabored Abd: soft, appropriately ttp, ND, +BS, drain with SS fluid, stoma viable without any output yet, incision c/d/i    Lab Results:  Recent Labs    02/02/20 0547 02/03/20 0454  WBC 11.6* 11.0*  HGB 11.3* 10.4*  HCT 36.2 33.2*  PLT 336 287   BMET Recent Labs    02/02/20 0547 02/03/20 0454  NA 132* 134*  K 3.7 3.4*  CL 95* 98  CO2 22 24  GLUCOSE 82 146*  BUN 7 5*  CREATININE 0.66 0.53  CALCIUM 7.9* 7.8*   PT/INR No results for input(s): LABPROT, INR in the last 72 hours. CMP     Component Value Date/Time   NA 134 (L) 02/03/2020 0454   K 3.4 (L) 02/03/2020 0454   CL 98 02/03/2020 0454   CO2 24 02/03/2020 0454   GLUCOSE 146 (H) 02/03/2020 0454   BUN 5 (L) 02/03/2020 0454   CREATININE 0.53 02/03/2020 0454   CALCIUM 7.8 (L) 02/03/2020 0454   PROT 6.0 (L) 02/03/2020 0454   ALBUMIN 2.8 (L) 02/03/2020 0454   AST 15 02/03/2020 0454   ALT 26 02/03/2020 0454   ALKPHOS 61 02/03/2020 0454   BILITOT 1.1 02/03/2020 0454   GFRNONAA >60 02/03/2020 0454   GFRAA >60 10/07/2015  0650   Lipase     Component Value Date/Time   LIPASE 19 01/24/2020 1810       Studies/Results: No results found.  Anti-infectives: Anti-infectives (From admission, onward)   Start     Dose/Rate Route Frequency Ordered Stop   01/31/20 1045  ceFAZolin (ANCEF) IVPB 2g/100 mL premix        2 g 200 mL/hr over 30 Minutes Intravenous  Once 01/31/20 7893 01/31/20 1236       Assessment/Plan Cindy Peterson is a 54 year old female who presented on 11/29 with a bowel obstruction and parastomal hernia, now s/p modified retromuscular sugarbaker repair of parastomal and ventral hernia with 25 x 30cm Bard Soft mesh.  Parastomal and ventral incisional hernias s/p repair as above Small bowel obstruction 2/2 hernias s/p repair as above - POD#3 - advance to FLD and ensure shakes today - continue to mobilize - drain with 90cc SS fluid in last 24h - cont for now but may be able to remove prior to d/c - await return of bowel function   PCOS, HTN, Asthma - home meds  FEN: Maintenance IVF, FLD, ensure ID: None VTE: Lovenox Foley: removed 12/8 Dispo: Continued care on the floor, anticipate discharge home in 2-4 days  LOS: 10 days    Norm Parcel , PA-C [facility] 02/03/2020, 9:13 AM Please see Amion for pager number during day hours 7:00am-4:30pm

## 2020-02-03 NOTE — Progress Notes (Signed)
PROGRESS NOTE    Cindy Peterson  WJX:[id number] DOB: 06-09-1965 DOA: 01/24/2020 PCP: Karlene Einstein, MD     Brief Narrative:  54 years BF PMHx Colon cancer s/p colectomy, colostomy and parastomal hernia, recurrent large bowel obstruction, DM type II controlled, essential HTN, PCOS  Presented to the emergency room with abdominal pain and poor output from ostomy.  Patient was then admitted to hospital for treatment of bowel obstruction.  Patient was initially conservatively treated but her symptoms did not improve so patient underwent surgical intervention on 01/31/2020.    Subjective: 12/9 afebrile, A/O x4, appropriately tender in her abdomen.   Assessment & Plan: Covid vaccination; vaccinated   Principal Problem:   Small bowel obstruction ([place]) Active Problems:   Parastomal hernia   Essential hypertension   Acid reflux   Morbid obesity with BMI of 45.0-49.9, adult (HCC)   PCOS (polycystic ovarian syndrome)   Primary insomnia   Colostomy in place [facility])   Constipation, chronic   Partial small bowel obstruction/parastomal hernia/ileus with constipation: -Patient was treated conservatively initially but did not improve and Failed NG tube clamping trial with significant NG output and abdominal discomfort.  Patient subsequently underwent.exploratory laparotomy with parastomal hernia repair with mesh on 01/31/2020.  Postoperative management as per general surgery  Essential hypertension: . -Hydralazine 10 mgPRN  DM type II controlled  -On Metformin at home.   -Sensitive SSI     Hypokalemia -Potassium goal> 4 -Potassium PO 80 mEq   DVT prophylaxis: Lovenox Code Status: Full Family Communication:  Status is: Inpatient    Dispo: The patient is from: Home              Anticipated d/c is to: Home              Anticipated d/c date is: Per surgery              Patient currently unstable      Consultants:  General surgery  Procedures/Significant Events:    I  have personally reviewed and interpreted all radiology studies and my findings are as above.  VENTILATOR SETTINGS:    Cultures   Antimicrobials: Anti-infectives (From admission, onward)   Start     Ordered Stop   01/31/20 1045  ceFAZolin (ANCEF) IVPB 2g/100 mL premix        01/31/20 0921 01/31/20 1236       Devices    LINES / TUBES:      Continuous Infusions: . methocarbamol (ROBAXIN) IV 1,000 mg (02/03/20 2130)     Objective: Vitals:   02/02/20 1400 02/02/20 1405 02/02/20 2102 02/03/20 0640  BP: 127/77  137/64 120/61  Pulse: (!) 112 98 (!) 109 (!) 102  Resp: 16  18 18   Temp: 98.6 F (37 C)  98.6 F (37 C) 98.1 F (36.7 C)  TempSrc: Oral  Oral Oral  SpO2: 99%  100% 100%  Weight:      Height:        Intake/Output Summary (Last 24 hours) at 02/03/2020 1302 Last data filed at 02/03/2020 1153 Gross per 24 hour  Intake 786.48 ml  Output 1650 ml  Net -863.52 ml   Filed Weights   01/24/20 1655 01/31/20 1048  Weight: 122.5 kg 124.7 kg    Examination:  General: A/O x4, No acute respiratory distress Eyes: negative scleral hemorrhage, negative anisocoria, negative icterus ENT: Negative Runny nose, negative gingival bleeding, Neck:  Negative scars, masses, torticollis, lymphadenopathy, JVD Lungs: Clear to auscultation bilaterally without wheezes or  crackles Cardiovascular: Regular rate and rhythm without murmur gallop or rub normal S1 and S2 Abdomen: Positive appropriate tenderness abdominal pain, positive distended, negative soft, bowel sounds, no rebound, no ascites, no appreciable mass Extremities: No significant cyanosis, clubbing, or edema bilateral lower extremities Skin: Negative rashes, lesions, ulcers Psychiatric:  Negative depression, negative anxiety, negative fatigue, negative mania  Central nervous system:  Cranial nerves II through XII intact, tongue/uvula midline, all extremities muscle strength 5/5, sensation intact throughout, negative  dysarthria, negative expressive aphasia, negative receptive aphasia.  .     Data Reviewed: Care during the described time interval was provided by me .  I have reviewed this patient's available data, including medical history, events of note, physical examination, and all test results as part of my evaluation.  CBC: Recent Labs  Lab 01/30/20 0603 01/31/20 0519 02/01/20 0535 02/02/20 0547 02/03/20 0454  WBC 7.5 7.1 9.7 11.6* 11.0*  NEUTROABS 5.6  --   --   --  8.2*  HGB 12.1 11.3* 12.4 11.3* 10.4*  HCT 38.0 35.9* 39.0 36.2 33.2*  MCV 84.3 85.5 84.8 86.0 84.7  PLT 280 283 351 336 623   Basic Metabolic Panel: Recent Labs  Lab 01/30/20 0603 01/31/20 0519 02/01/20 0535 02/02/20 0547 02/03/20 0454  NA 134* 136 135 132* 134*  K 3.0* 3.7 3.7 3.7 3.4*  CL 90* 92* 95* 95* 98  CO2 30 30 24 22 24   GLUCOSE 96 84 98 82 146*  BUN 10 7 7 7  5*  CREATININE 0.58 0.61 0.64 0.66 0.53  CALCIUM 8.4* 8.3* 7.9* 7.9* 7.8*  MG 2.3 2.4 2.2 2.3 2.0  PHOS 3.0 2.8 3.4 2.5  2.4*  --    GFR: Estimated Creatinine Clearance: 105.6 mL/min (by C-G formula based on SCr of 0.53 mg/dL). Liver Function Tests: Recent Labs  Lab 01/30/20 0603 01/31/20 0519 02/01/20 0535 02/02/20 0547 02/03/20 0454  AST 37 40 30 21 15   ALT 62* 62* 50* 36 26  ALKPHOS 82 79 62 71 61  BILITOT 1.3* 1.1 1.6* 1.5* 1.1  PROT 7.4 6.8 6.4* 6.4* 6.0*  ALBUMIN 3.8 3.4* 3.3* 3.0* 2.8*   No results for input(s): LIPASE, AMYLASE in the last 168 hours. No results for input(s): AMMONIA in the last 168 hours. Coagulation Profile: No results for input(s): INR, PROTIME in the last 168 hours. Cardiac Enzymes: No results for input(s): CKTOTAL, CKMB, CKMBINDEX, TROPONINI in the last 168 hours. BNP (last 3 results) No results for input(s): PROBNP in the last 8760 hours. HbA1C: No results for input(s): HGBA1C in the last 72 hours. CBG: Recent Labs  Lab 02/02/20 1751 02/02/20 2345 02/03/20 0635 02/03/20 0751 02/03/20 1146   GLUCAP 92 98 124* 114* 87   Lipid Profile: No results for input(s): CHOL, HDL, LDLCALC, TRIG, CHOLHDL, LDLDIRECT in the last 72 hours. Thyroid Function Tests: No results for input(s): TSH, T4TOTAL, FREET4, T3FREE, THYROIDAB in the last 72 hours. Anemia Panel: No results for input(s): VITAMINB12, FOLATE, FERRITIN, TIBC, IRON, RETICCTPCT in the last 72 hours. Sepsis Labs: No results for input(s): PROCALCITON, LATICACIDVEN in the last 168 hours.  Recent Results (from the past 240 hour(s))  Resp Panel by RT-PCR (Flu A&B, Covid) Nasopharyngeal Swab     Status: None   Collection Time: 01/24/20  7:24 PM   Specimen: Nasopharyngeal Swab; Nasopharyngeal(NP) swabs in vial transport medium  Result Value Ref Range Status   SARS Coronavirus 2 by RT PCR NEGATIVE NEGATIVE Final    Comment: (NOTE) SARS-CoV-2 target nucleic acids  are NOT DETECTED.  The SARS-CoV-2 RNA is generally detectable in upper respiratory specimens during the acute phase of infection. The lowest concentration of SARS-CoV-2 viral copies this assay can detect is 138 copies/mL. A negative result does not preclude SARS-Cov-2 infection and should not be used as the sole basis for treatment or other patient management decisions. A negative result may occur with  improper specimen collection/handling, submission of specimen other than nasopharyngeal swab, presence of viral mutation(s) within the areas targeted by this assay, and inadequate number of viral copies(<138 copies/mL). A negative result must be combined with clinical observations, patient history, and epidemiological information. The expected result is Negative.  Fact Sheet for Patients:  EntrepreneurPulse.com.au  Fact Sheet for Healthcare Providers:  IncredibleEmployment.be  This test is no t yet approved or cleared by the [place] FDA and  has been authorized for detection and/or diagnosis of SARS-CoV-2 by FDA under an  Emergency Use Authorization (EUA). This EUA will remain  in effect (meaning this test can be used) for the duration of the COVID-19 declaration under Section 564(b)(1) of the Act, 21 U.S.C.section 360bbb-3(b)(1), unless the authorization is terminated  or revoked sooner.       Influenza A by PCR NEGATIVE NEGATIVE Final   Influenza B by PCR NEGATIVE NEGATIVE Final    Comment: (NOTE) The Xpert Xpress SARS-CoV-2/FLU/RSV plus assay is intended as an aid in the diagnosis of influenza from Nasopharyngeal swab specimens and should not be used as a sole basis for treatment. Nasal washings and aspirates are unacceptable for Xpert Xpress SARS-CoV-2/FLU/RSV testing.  Fact Sheet for Patients: EntrepreneurPulse.com.au  Fact Sheet for Healthcare Providers: IncredibleEmployment.be  This test is not yet approved or cleared by the [place] FDA and has been authorized for detection and/or diagnosis of SARS-CoV-2 by FDA under an Emergency Use Authorization (EUA). This EUA will remain in effect (meaning this test can be used) for the duration of the COVID-19 declaration under Section 564(b)(1) of the Act, 21 U.S.C. section 360bbb-3(b)(1), unless the authorization is terminated or revoked.  Performed at [facility], [address], [place], [place] [address]   Surgical pcr screen     Status: None   Collection Time: 01/31/20 10:28 AM   Specimen: Nasal Mucosa; Nasal Swab  Result Value Ref Range Status   MRSA, PCR NEGATIVE NEGATIVE Final   Staphylococcus aureus NEGATIVE NEGATIVE Final    Comment: (NOTE) The Xpert SA Assay (FDA approved for NASAL specimens in patients 54 years of age and older), is one component of a comprehensive surveillance program. It is not intended to diagnose infection nor to guide or monitor treatment. Performed at [facility], [place] [address], [place], [place] [address]          Radiology  Studies: No results found.      Scheduled Meds: . enoxaparin (LOVENOX) injection  40 mg Subcutaneous Q24H  . feeding supplement  237 mL Oral BID BM  . gabapentin  300 mg Oral TID  . insulin aspart  0-9 Units Subcutaneous Q6H  . potassium chloride  40 mEq Oral BID   Continuous Infusions: . methocarbamol (ROBAXIN) IV 1,000 mg (02/03/20 0613)     LOS: 10 days    Time spent:40 min    Errik Mitchelle, Geraldo Docker, MD [facility] Pager [phone]  If 7PM-7AM, please contact night-coverage [email or web address] Password [email or web address] 02/03/2020, 1:02 PM

## 2020-02-04 LAB — CBC WITH DIFFERENTIAL/PLATELET
Abs Immature Granulocytes: 0.11 10*3/uL — ABNORMAL HIGH (ref 0.00–0.07)
Basophils Absolute: 0 10*3/uL (ref 0.0–0.1)
Basophils Relative: 0 %
Eosinophils Absolute: 0.2 10*3/uL (ref 0.0–0.5)
Eosinophils Relative: 3 %
HCT: 35.9 % — ABNORMAL LOW (ref 36.0–46.0)
Hemoglobin: 11.2 g/dL — ABNORMAL LOW (ref 12.0–15.0)
Immature Granulocytes: 1 %
Lymphocytes Relative: 18 %
Lymphs Abs: 1.6 10*3/uL (ref 0.7–4.0)
MCH: 26.9 pg (ref 26.0–34.0)
MCHC: 31.2 g/dL (ref 30.0–36.0)
MCV: 86.1 fL (ref 80.0–100.0)
Monocytes Absolute: 0.8 10*3/uL (ref 0.1–1.0)
Monocytes Relative: 9 %
Neutro Abs: 5.8 10*3/uL (ref 1.7–7.7)
Neutrophils Relative %: 69 %
Platelets: 311 10*3/uL (ref 150–400)
RBC: 4.17 MIL/uL (ref 3.87–5.11)
RDW: 15.8 % — ABNORMAL HIGH (ref 11.5–15.5)
WBC: 8.5 10*3/uL (ref 4.0–10.5)
nRBC: 0 % (ref 0.0–0.2)

## 2020-02-04 LAB — COMPREHENSIVE METABOLIC PANEL
ALT: 30 U/L (ref 0–44)
AST: 24 U/L (ref 15–41)
Albumin: 3.1 g/dL — ABNORMAL LOW (ref 3.5–5.0)
Alkaline Phosphatase: 81 U/L (ref 38–126)
Anion gap: 10 (ref 5–15)
BUN: 7 mg/dL (ref 6–20)
CO2: 25 mmol/L (ref 22–32)
Calcium: 8.1 mg/dL — ABNORMAL LOW (ref 8.9–10.3)
Chloride: 100 mmol/L (ref 98–111)
Creatinine, Ser: 0.51 mg/dL (ref 0.44–1.00)
GFR, Estimated: >60 mL/min (ref >60)
Glucose, Bld: 111 mg/dL — ABNORMAL HIGH (ref 70–99)
Potassium: 3.9 mmol/L (ref 3.5–5.1)
Sodium: 135 mmol/L (ref 135–145)
Total Bilirubin: 1 mg/dL (ref 0.3–1.2)
Total Protein: 6.7 g/dL (ref 6.5–8.1)

## 2020-02-04 LAB — MAGNESIUM: Magnesium: 2.2 mg/dL (ref 1.7–2.4)

## 2020-02-04 LAB — GLUCOSE, CAPILLARY
Glucose-Capillary: 107 mg/dL — ABNORMAL HIGH (ref 70–99)
Glucose-Capillary: 110 mg/dL — ABNORMAL HIGH (ref 70–99)
Glucose-Capillary: 118 mg/dL — ABNORMAL HIGH (ref 70–99)
Glucose-Capillary: 103 mg/dL — ABNORMAL HIGH (ref 70–99)

## 2020-02-04 LAB — PHOSPHORUS: Phosphorus: 2.1 mg/dL — ABNORMAL LOW (ref 2.5–4.6)

## 2020-02-04 MED ORDER — OXYCODONE HCL 5 MG PO TABS
5.0000 mg | ORAL_TABLET | ORAL | Status: DC | PRN
  Administered 2020-02-04 (×2): 5 mg via ORAL
  Filled 2020-02-04: qty 1
  Filled 2020-02-04: qty 2
  Filled 2020-02-04: qty 1

## 2020-02-04 MED ORDER — ACETAMINOPHEN 325 MG PO TABS
650.0000 mg | ORAL_TABLET | Freq: Four times a day (QID) | ORAL | Status: DC
  Administered 2020-02-04 – 2020-02-08 (×14): 650 mg via ORAL
  Filled 2020-02-04 (×15): qty 2

## 2020-02-04 MED ORDER — TRAMADOL HCL 50 MG PO TABS: 50.0000 mg | ORAL_TABLET | Freq: Four times a day (QID) | ORAL | Status: DC | PRN

## 2020-02-04 MED ORDER — METHOCARBAMOL 500 MG PO TABS
1000.0000 mg | ORAL_TABLET | Freq: Three times a day (TID) | ORAL | Status: DC
  Administered 2020-02-04 – 2020-02-08 (×12): 1000 mg via ORAL
  Filled 2020-02-04 (×12): qty 2

## 2020-02-04 MED ORDER — GUAIFENESIN-DM 100-10 MG/5ML PO SYRP: 5.0000 mL | ORAL_SOLUTION | ORAL | Status: DC | PRN

## 2020-02-04 MED ORDER — HYDROMORPHONE HCL 1 MG/ML IJ SOLN
0.5000 mg | INTRAMUSCULAR | Status: DC | PRN
  Administered 2020-02-04 – 2020-02-05 (×3): 0.5 mg via INTRAVENOUS
  Filled 2020-02-04 (×3): qty 0.5

## 2020-02-04 MED ORDER — ACETAMINOPHEN 325 MG PO TABS: 650.0000 mg | ORAL_TABLET | Freq: Four times a day (QID) | ORAL | Status: AC | PRN

## 2020-02-04 MED ORDER — GABAPENTIN 300 MG PO CAPS: 300.0000 mg | ORAL_CAPSULE | Freq: Three times a day (TID) | ORAL | 0 refills | Status: AC

## 2020-02-04 MED ORDER — METHOCARBAMOL 500 MG PO TABS: 1000.0000 mg | ORAL_TABLET | Freq: Three times a day (TID) | ORAL | 1 refills | Status: AC

## 2020-02-04 MED ORDER — OXYCODONE HCL 5 MG PO TABS
5.0000 mg | ORAL_TABLET | Freq: Four times a day (QID) | ORAL | 0 refills | Status: DC | PRN
Start: 2020-02-04 — End: 2020-02-27

## 2020-02-04 MED ORDER — HYDROMORPHONE HCL 1 MG/ML IJ SOLN: 0.5000 mg | INTRAMUSCULAR | Status: DC | PRN

## 2020-02-04 NOTE — Progress Notes (Signed)
Physical Therapy Treatment Patient Details Name: Cindy Peterson MRN: [id number] DOB: 08-29-1965 Today's Date: 02/04/2020    History of Present Illness Pt admitted with SBO and now s/p parasomal and ventral hernia repair.  Pt with hx of colon CA    PT Comments    Pt continues cooperative but and up to bathroom for toileting and hygiene at sink as well as up to ambulate in halls but distance ltd by fatigue and increased abdominal discomfort - pt states she coughed too much yesterday.   Follow Up Recommendations  No PT follow up     Equipment Recommendations  Rolling walker with 5" wheels    Recommendations for Other Services       Precautions / Restrictions Precautions Precautions: Fall;Other (comment) Precaution Comments: JP drain on R Restrictions Weight Bearing Restrictions: No    Mobility  Bed Mobility               General bed mobility comments: Pt up in chair and requests back to same  Transfers Overall transfer level: Needs assistance Equipment used: Rolling walker (2 wheeled) Transfers: Sit to/from Stand Sit to Stand: Min guard;Supervision         General transfer comment: min cues for use of UEs to self assist  Ambulation/Gait Ambulation/Gait assistance: Min guard;Supervision Gait Distance (Feet): 200 Feet (and 18' into bathroom) Assistive device: Rolling walker (2 wheeled) Gait Pattern/deviations: Step-through pattern;Decreased step length - right;Decreased step length - left;Shuffle;Trunk flexed Gait velocity: decr   General Gait Details: cues for posture and position from RW; increased time with standing rest breaks required to complete task   Stairs             Wheelchair Mobility    Modified Rankin (Stroke Patients Only)       Balance Overall balance assessment: Mild deficits observed, not formally tested                                          Cognition Arousal/Alertness: Awake/alert Behavior During  Therapy: WFL for tasks assessed/performed Overall Cognitive Status: Within Functional Limits for tasks assessed                                        Exercises      General Comments        Pertinent Vitals/Pain Pain Assessment: 0-10 Pain Score: 6  Pain Location: abdomen Pain Descriptors / Indicators: Guarding;Sore Pain Intervention(s): Limited activity within patient's tolerance;Monitored during session;Premedicated before session;Heat applied    Home Living                      Prior Function            PT Goals (current goals can now be found in the care plan section) Acute Rehab PT Goals Patient Stated Goal: walk independently PT Goal Formulation: With patient Time For Goal Achievement: 02/17/20 Potential to Achieve Goals: Good Progress towards PT goals: Progressing toward goals    Frequency    Min 3X/week      PT Plan Current plan remains appropriate    Co-evaluation              AM-PAC PT "6 Clicks" Mobility   Outcome Measure  Help needed turning from your back to your side  while in a flat bed without using bedrails?: A Little Help needed moving from lying on your back to sitting on the side of a flat bed without using bedrails?: A Lot Help needed moving to and from a bed to a chair (including a wheelchair)?: A Little Help needed standing up from a chair using your arms (e.g., wheelchair or bedside chair)?: A Little Help needed to walk in hospital room?: A Little Help needed climbing 3-5 steps with a railing? : A Little 6 Click Score: 17    End of Session   Activity Tolerance: Patient tolerated treatment well Patient left: in chair;with call bell/phone within reach;with chair alarm set Nurse Communication: Mobility status PT Visit Diagnosis: Unsteadiness on feet (R26.81);Difficulty in walking, not elsewhere classified (R26.2)     Time: 1610-9604 PT Time Calculation (min) (ACUTE ONLY): 33 min  Charges:  $Gait  Training: 8-22 mins $Therapeutic Activity: 8-22 mins                     [place] Pager [phone] Office [phone]    Sharnae Winfree 02/04/2020, 12:49 PM

## 2020-02-04 NOTE — Progress Notes (Signed)
PROGRESS NOTE    Cindy Peterson  JJH:[id number] DOB: [birth year] DOA: 01/24/2020 PCP: Karlene Einstein, MD     Brief Narrative:  54 years BF PMHx Colon cancer s/p colectomy, colostomy and parastomal hernia, recurrent large bowel obstruction, DM type II controlled, essential HTN, PCOS  Presented to the emergency room with abdominal pain and poor output from ostomy.  Patient was then admitted to hospital for treatment of bowel obstruction.  Patient was initially conservatively treated but her symptoms did not improve so patient underwent surgical intervention on 01/31/2020.    Subjective: 12/10 afebrile overnight patient biggest complaint is pain with coughing around surgical site.   Assessment & Plan: Covid vaccination; vaccinated   Principal Problem:   Small bowel obstruction ([place]) Active Problems:   Parastomal hernia   Essential hypertension   Acid reflux   Morbid obesity with BMI of 45.0-49.9, adult (HCC)   PCOS (polycystic ovarian syndrome)   Primary insomnia   Colostomy in place [facility])   Constipation, chronic   Partial small bowel obstruction/parastomal hernia/ileus with constipation: -Patient was treated conservatively initially but did not improve and Failed NG tube clamping trial with significant NG output and abdominal discomfort.  Patient subsequently underwent.exploratory laparotomy with parastomal hernia repair with mesh on 01/31/2020.  Postoperative management as per general surgery  Essential hypertension: . -Hydralazine 10 mgPRN  DM type II controlled  -On Metformin at home.   -Sensitive SSI     Hypokalemia -Potassium goal> 4 -Potassium PO 80 mEq   Goals of care -If patient tolerates solid food overnight could discharge in the a.m.  Patient states her plan is to stay with her sister or aunt, which would be a safe discharge plan.   DVT prophylaxis: Lovenox Code Status: Full Family Communication:  Status is: Inpatient    Dispo: The patient is from:  Home              Anticipated d/c is to: Home              Anticipated d/c date is: Per surgery              Patient currently unstable      Consultants:  General surgery  Procedures/Significant Events:    I have personally reviewed and interpreted all radiology studies and my findings are as above.  VENTILATOR SETTINGS:    Cultures   Antimicrobials: Anti-infectives (From admission, onward)   Start     Ordered Stop   01/31/20 1045  ceFAZolin (ANCEF) IVPB 2g/100 mL premix        01/31/20 0921 01/31/20 1236       Devices    LINES / TUBES:      Continuous Infusions:    Objective: Vitals:   02/03/20 0640 02/03/20 1330 02/03/20 2030 02/04/20 0545  BP: 120/61 118/76 127/72 135/76  Pulse: (!) 102 (!) 108 (!) 107 (!) 108  Resp: 18 18 18 18   Temp: 98.1 F (36.7 C) 98.8 F (37.1 C) 98.8 F (37.1 C) 98.6 F (37 C)  TempSrc: Oral Oral Oral Oral  SpO2: 100% 98% 98% 99%  Weight:      Height:        Intake/Output Summary (Last 24 hours) at 02/04/2020 1153 Last data filed at 02/04/2020 0935 Gross per 24 hour  Intake 520 ml  Output 1340 ml  Net -820 ml   Filed Weights   01/24/20 1655 01/31/20 1048  Weight: 122.5 kg 124.7 kg    Examination:  General: A/O x4,  No acute respiratory distress Eyes: negative scleral hemorrhage, negative anisocoria, negative icterus ENT: Negative Runny nose, negative gingival bleeding, Neck:  Negative scars, masses, torticollis, lymphadenopathy, JVD Lungs: Clear to auscultation bilaterally without wheezes or crackles Cardiovascular: Regular rate and rhythm without murmur gallop or rub normal S1 and S2 Abdomen: Positive appropriate tenderness abdominal pain, positive distended, negative soft, bowel sounds, no rebound, no ascites, no appreciable mass Extremities: No significant cyanosis, clubbing, or edema bilateral lower extremities Skin: Negative rashes, lesions, ulcers Psychiatric:  Negative depression, negative anxiety,  negative fatigue, negative mania  Central nervous system:  Cranial nerves II through XII intact, tongue/uvula midline, all extremities muscle strength 5/5, sensation intact throughout, negative dysarthria, negative expressive aphasia, negative receptive aphasia.  .     Data Reviewed: Care during the described time interval was provided by me .  I have reviewed this patient's available data, including medical history, events of note, physical examination, and all test results as part of my evaluation.  CBC: Recent Labs  Lab 01/30/20 0603 01/31/20 0519 02/01/20 0535 02/02/20 0547 02/03/20 0454 02/04/20 0539  WBC 7.5 7.1 9.7 11.6* 11.0* 8.5  NEUTROABS 5.6  --   --   --  8.2* 5.8  HGB 12.1 11.3* 12.4 11.3* 10.4* 11.2*  HCT 38.0 35.9* 39.0 36.2 33.2* 35.9*  MCV 84.3 85.5 84.8 86.0 84.7 86.1  PLT 280 283 351 336 287 259   Basic Metabolic Panel: Recent Labs  Lab 01/30/20 0603 01/31/20 0519 02/01/20 0535 02/02/20 0547 02/03/20 0454 02/04/20 0539  NA 134* 136 135 132* 134* 135  K 3.0* 3.7 3.7 3.7 3.8  3.4* 3.9  CL 90* 92* 95* 95* 98 100  CO2 30 30 24 22 24 25   GLUCOSE 96 84 98 82 146* 111*  BUN 10 7 7 7  5* 7  CREATININE 0.58 0.61 0.64 0.66 0.53 0.51  CALCIUM 8.4* 8.3* 7.9* 7.9* 7.8* 8.1*  MG 2.3 2.4 2.2 2.3 1.9  2.0 2.2  PHOS 3.0 2.8 3.4 2.5  2.4*  --  2.1*   GFR: Estimated Creatinine Clearance: 105.6 mL/min (by C-G formula based on SCr of 0.51 mg/dL). Liver Function Tests: Recent Labs  Lab 01/31/20 0519 02/01/20 0535 02/02/20 0547 02/03/20 0454 02/04/20 0539  AST 40 30 21 15 24   ALT 62* 50* 36 26 30  ALKPHOS 79 62 71 61 81  BILITOT 1.1 1.6* 1.5* 1.1 1.0  PROT 6.8 6.4* 6.4* 6.0* 6.7  ALBUMIN 3.4* 3.3* 3.0* 2.8* 3.1*   No results for input(s): LIPASE, AMYLASE in the last 168 hours. No results for input(s): AMMONIA in the last 168 hours. Coagulation Profile: No results for input(s): INR, PROTIME in the last 168 hours. Cardiac Enzymes: No results for  input(s): CKTOTAL, CKMB, CKMBINDEX, TROPONINI in the last 168 hours. BNP (last 3 results) No results for input(s): PROBNP in the last 8760 hours. HbA1C: No results for input(s): HGBA1C in the last 72 hours. CBG: Recent Labs  Lab 02/03/20 2007 02/03/20 2331 02/04/20 0541 02/04/20 0745 02/04/20 1135  GLUCAP 143* 125* 107* 110* 118*   Lipid Profile: No results for input(s): CHOL, HDL, LDLCALC, TRIG, CHOLHDL, LDLDIRECT in the last 72 hours. Thyroid Function Tests: No results for input(s): TSH, T4TOTAL, FREET4, T3FREE, THYROIDAB in the last 72 hours. Anemia Panel: No results for input(s): VITAMINB12, FOLATE, FERRITIN, TIBC, IRON, RETICCTPCT in the last 72 hours. Sepsis Labs: No results for input(s): PROCALCITON, LATICACIDVEN in the last 168 hours.  Recent Results (from the past 240 hour(s))  Surgical  pcr screen     Status: None   Collection Time: 01/31/20 10:28 AM   Specimen: Nasal Mucosa; Nasal Swab  Result Value Ref Range Status   MRSA, PCR NEGATIVE NEGATIVE Final   Staphylococcus aureus NEGATIVE NEGATIVE Final    Comment: (NOTE) The Xpert SA Assay (FDA approved for NASAL specimens in patients 10 years of age and older), is one component of a comprehensive surveillance program. It is not intended to diagnose infection nor to guide or monitor treatment. Performed at [facility], [place] [address], [place], [place] [address]          Radiology Studies: No results found.      Scheduled Meds: . acetaminophen  650 mg Oral Q6H  . enoxaparin (LOVENOX) injection  40 mg Subcutaneous Q24H  . feeding supplement  237 mL Oral BID BM  . gabapentin  300 mg Oral TID  . insulin aspart  0-9 Units Subcutaneous Q6H  . methocarbamol  1,000 mg Oral TID   Continuous Infusions:    LOS: 11 days    Time spent:40 min    Sherman Lipuma, Geraldo Docker, MD [facility] Pager [phone]  If 7PM-7AM, please contact night-coverage [email or web address] Password  [facility] 02/04/2020, 11:53 AM

## 2020-02-04 NOTE — Progress Notes (Signed)
Progress Note  4 Days Post-Op  Subjective: Patient reports she is tolerating FLD. Passing some flatus via stoma. Walked yesterday and has been up some in room already this AM. She reports she plans to go stay with her sister on discharge. Pain control improving.  Objective: Vital signs in last 24 hours: Temp:  [98.6 F (37 C)-98.8 F (37.1 C)] 98.6 F (37 C) (12/10 0545) Pulse Rate:  [107-108] 108 (12/10 0545) Resp:  [18] 18 (12/10 0545) BP: (118-135)/(72-76) 135/76 (12/10 0545) SpO2:  [98 %-99 %] 99 % (12/10 0545) Last BM Date: 01/29/20  Intake/Output from previous day: 12/09 0701 - 12/10 0700 In: 400 [P.O.:400] Out: 2250 [Urine:2200; Drains:50] Intake/Output this shift: No intake/output data recorded.  PE: General: pleasant, WD, morbidly obese female who is laying in bed in NAD Heart: regular, rate, and rhythm.  Lungs: Respiratory effort nonlabored Abd: soft, appropriately ttp, ND, +BS, drain with SS fluid, stoma viable with some gas in appliance, incision c/d/i   Lab Results:  Recent Labs    02/03/20 0454 02/04/20 0539  WBC 11.0* 8.5  HGB 10.4* 11.2*  HCT 33.2* 35.9*  PLT 287 311   BMET Recent Labs    02/03/20 0454 02/04/20 0539  NA 134* 135  K 3.8  3.4* 3.9  CL 98 100  CO2 24 25  GLUCOSE 146* 111*  BUN 5* 7  CREATININE 0.53 0.51  CALCIUM 7.8* 8.1*   PT/INR No results for input(s): LABPROT, INR in the last 72 hours. CMP     Component Value Date/Time   NA 135 02/04/2020 0539   K 3.9 02/04/2020 0539   CL 100 02/04/2020 0539   CO2 25 02/04/2020 0539   GLUCOSE 111 (H) 02/04/2020 0539   BUN 7 02/04/2020 0539   CREATININE 0.51 02/04/2020 0539   CALCIUM 8.1 (L) 02/04/2020 0539   PROT 6.7 02/04/2020 0539   ALBUMIN 3.1 (L) 02/04/2020 0539   AST 24 02/04/2020 0539   ALT 30 02/04/2020 0539   ALKPHOS 81 02/04/2020 0539   BILITOT 1.0 02/04/2020 0539   GFRNONAA >60 02/04/2020 0539   GFRAA >60 10/07/2015 0650   Lipase     Component Value  Date/Time   LIPASE 19 01/24/2020 1810       Studies/Results: No results found.  Anti-infectives: Anti-infectives (From admission, onward)   Start     Dose/Rate Route Frequency Ordered Stop   01/31/20 1045  ceFAZolin (ANCEF) IVPB 2g/100 mL premix        2 g 200 mL/hr over 30 Minutes Intravenous  Once 01/31/20 9470 01/31/20 1236       Assessment/Plan Ms. Kiernan is a 54 year old female who presented on 11/29 with a bowel obstruction and parastomal hernia, now s/p modified retromuscular sugarbaker repair of parastomal and ventral hernia with 25 x 30cm Bard Soft mesh.  Parastomal andventral incisional hernias s/p repair as above Small bowel obstruction 2/2 hernias s/p repair as above - POD#4 - tolerating FLD and starting to have some bowel function - advance to reg diet today  - continue to mobilize - remove JP prior to d/c - stable for discharge later today from a surgical perspective if tolerating reg diet and continuing to have some bowel function. Will arrange follow up in the office in 3-4 weeks   PCOS, HTN, Asthma- home meds  FEN:reg diet  ID: None VTE: Lovenox Foley:removed 12/8   LOS: 11 days    Norm Parcel , [facility] 02/04/2020, 7:48  AM Please see Amion for pager number during day hours 7:00am-4:30pm

## 2020-02-04 NOTE — Progress Notes (Signed)
Pt has had 2 episodes of projectile vomiting today.

## 2020-02-04 NOTE — Discharge Instructions (Signed)
[facility]      [facility] [phone]  OPEN ABDOMINAL SURGERY: POST OP INSTRUCTIONS  Always review your discharge instruction sheet given to you by the facility where your surgery was performed.  IF YOU HAVE DISABILITY OR FAMILY LEAVE FORMS, YOU MUST BRING THEM TO THE OFFICE FOR PROCESSING.  PLEASE DO NOT GIVE THEM TO YOUR DOCTOR.  1. A prescription for pain medication may be given to you upon discharge.  Take your pain medication as prescribed, if needed.  If narcotic pain medicine is not needed, then you may take acetaminophen (Tylenol) or ibuprofen (Advil) as needed. 2. Take your usually prescribed medications unless otherwise directed. 3. If you need a refill on your pain medication, please contact your pharmacy. They will contact our office to request authorization.  Prescriptions will not be filled after 5pm or on week-ends. 4. You should follow a light diet the first few days after arrival home, such as soup and crackers, pudding, etc.unless your doctor has advised otherwise. A high-fiber, low fat diet can be resumed as tolerated.   Be sure to include lots of fluids daily. Most patients will experience some swelling and bruising on the chest and neck area.  Ice packs will help.  Swelling and bruising can take several days to resolve 5. Most patients will experience some swelling and bruising in the area of the incision. Ice pack will help. Swelling and bruising can take several days to resolve..  6. It is common to experience some constipation if taking pain medication after surgery.  Increasing fluid intake and taking a stool softener will usually help or prevent this problem from occurring.  A mild laxative (Milk of Magnesia or Miralax) should be taken according to package directions if there are no bowel movements after 48 hours. 7.  You may have steri-strips (small skin tapes) in place directly over the incision.  These strips should be left on the skin for 7-10 days.  If your  surgeon used skin glue on the incision, you may shower in 24 hours.  The glue will flake off over the next 2-3 weeks.  Any sutures or staples will be removed at the office during your follow-up visit. You may find that a light gauze bandage over your incision may keep your staples from being rubbed or pulled. You may shower and replace the bandage daily. 8. ACTIVITIES:  You may resume regular (light) daily activities beginning the next day--such as daily self-care, walking, climbing stairs--gradually increasing activities as tolerated.  You may have sexual intercourse when it is comfortable.  Refrain from any heavy lifting or straining until approved by your doctor. a. You may drive when you no longer are taking prescription pain medication, you can comfortably wear a seatbelt, and you can safely maneuver your car and apply brakes  9. You should see your doctor in the office for a follow-up appointment approximately two weeks after your surgery.  Make sure that you call for this appointment within a day or two after you arrive home to insure a convenient appointment time.  WHEN TO CALL YOUR DOCTOR: 1. Fever over 101.0 2. Inability to urinate 3. Nausea and/or vomiting 4. Extreme swelling or bruising 5. Continued bleeding from incision. 6. Increased pain, redness, or drainage from the incision. 7. Difficulty swallowing or breathing 8. Muscle cramping or spasms. 9. Numbness or tingling in hands or feet or around lips.  The clinic staff is available to answer your questions during regular business hours.  Please   don't hesitate to call and ask to speak to one of the nurses if you have concerns.  For further questions, please visit [email or web address]   

## 2020-02-05 ENCOUNTER — Inpatient Hospital Stay (HOSPITAL_COMMUNITY): Payer: BC Managed Care – PPO

## 2020-02-05 LAB — COMPREHENSIVE METABOLIC PANEL
ALT: 29 U/L (ref 0–44)
AST: 22 U/L (ref 15–41)
Albumin: 2.9 g/dL — ABNORMAL LOW (ref 3.5–5.0)
Alkaline Phosphatase: 74 U/L (ref 38–126)
Anion gap: 11 (ref 5–15)
BUN: 6 mg/dL (ref 6–20)
CO2: 24 mmol/L (ref 22–32)
Calcium: 7.9 mg/dL — ABNORMAL LOW (ref 8.9–10.3)
Chloride: 99 mmol/L (ref 98–111)
Creatinine, Ser: 0.56 mg/dL (ref 0.44–1.00)
GFR, Estimated: >60 mL/min (ref >60)
Glucose, Bld: 104 mg/dL — ABNORMAL HIGH (ref 70–99)
Potassium: 3.7 mmol/L (ref 3.5–5.1)
Sodium: 134 mmol/L — ABNORMAL LOW (ref 135–145)
Total Bilirubin: 0.9 mg/dL (ref 0.3–1.2)
Total Protein: 6.4 g/dL — ABNORMAL LOW (ref 6.5–8.1)

## 2020-02-05 LAB — CBC WITH DIFFERENTIAL/PLATELET
Abs Immature Granulocytes: 0.17 10*3/uL — ABNORMAL HIGH (ref 0.00–0.07)
Basophils Absolute: 0 10*3/uL (ref 0.0–0.1)
Basophils Relative: 1 %
Eosinophils Absolute: 0.3 10*3/uL (ref 0.0–0.5)
Eosinophils Relative: 3 %
HCT: 31.4 % — ABNORMAL LOW (ref 36.0–46.0)
Hemoglobin: 9.8 g/dL — ABNORMAL LOW (ref 12.0–15.0)
Immature Granulocytes: 2 %
Lymphocytes Relative: 21 %
Lymphs Abs: 1.7 10*3/uL (ref 0.7–4.0)
MCH: 26.7 pg (ref 26.0–34.0)
MCHC: 31.2 g/dL (ref 30.0–36.0)
MCV: 85.6 fL (ref 80.0–100.0)
Monocytes Absolute: 1 10*3/uL (ref 0.1–1.0)
Monocytes Relative: 11 %
Neutro Abs: 5.2 10*3/uL (ref 1.7–7.7)
Neutrophils Relative %: 62 %
Platelets: 309 10*3/uL (ref 150–400)
RBC: 3.67 MIL/uL — ABNORMAL LOW (ref 3.87–5.11)
RDW: 15.9 % — ABNORMAL HIGH (ref 11.5–15.5)
WBC: 8.4 10*3/uL (ref 4.0–10.5)
nRBC: 0 % (ref 0.0–0.2)

## 2020-02-05 LAB — GLUCOSE, CAPILLARY
Glucose-Capillary: 101 mg/dL — ABNORMAL HIGH (ref 70–99)
Glucose-Capillary: 104 mg/dL — ABNORMAL HIGH (ref 70–99)
Glucose-Capillary: 82 mg/dL (ref 70–99)
Glucose-Capillary: 97 mg/dL (ref 70–99)

## 2020-02-05 LAB — MAGNESIUM: Magnesium: 2.2 mg/dL (ref 1.7–2.4)

## 2020-02-05 LAB — PHOSPHORUS: Phosphorus: 2 mg/dL — ABNORMAL LOW (ref 2.5–4.6)

## 2020-02-05 MED ORDER — BISACODYL 10 MG RE SUPP
10.0000 mg | Freq: Once | RECTAL | Status: DC
  Filled 2020-02-05: qty 1

## 2020-02-05 MED ORDER — POTASSIUM PHOSPHATES 15 MMOLE/5ML IV SOLN
30.0000 mmol | Freq: Once | INTRAVENOUS | Status: AC
  Administered 2020-02-05: 30 mmol via INTRAVENOUS
  Filled 2020-02-05: qty 10

## 2020-02-05 MED ORDER — SODIUM CHLORIDE 0.9% FLUSH
10.0000 mL | INTRAVENOUS | Status: DC | PRN
Start: 2020-02-05 — End: 2020-02-08

## 2020-02-05 MED ORDER — POLYETHYLENE GLYCOL 3350 17 G PO PACK
17.0000 g | PACK | Freq: Every day | ORAL | Status: DC
  Administered 2020-02-05 – 2020-02-08 (×4): 17 g via ORAL
  Filled 2020-02-05 (×4): qty 1

## 2020-02-05 NOTE — Progress Notes (Signed)
Physical Therapy Treatment Patient Details Name: Cindy Peterson MRN: [id number] DOB: Jul 19, 1965 Today's Date: 02/05/2020    History of Present Illness Pt admitted with SBO and now s/p parasomal and ventral hernia repair.  Pt with hx of colon CA    PT Comments    Pt reports difficult night but feeling much better now and with noted improvement in activity tolerance, stability with ambulation and decreased assist for all tasks.  Follow Up Recommendations  No PT follow up     Equipment Recommendations  Rolling walker with 5" wheels    Recommendations for Other Services       Precautions / Restrictions Precautions Precautions: Fall;Other (comment) Precaution Comments: JP drain on R Restrictions Weight Bearing Restrictions: No    Mobility  Bed Mobility               General bed mobility comments: Pt up in chair and requests back to same  Transfers Overall transfer level: Needs assistance Equipment used: Rolling walker (2 wheeled) Transfers: Sit to/from Stand Sit to Stand: Min guard;Supervision         General transfer comment: min cues for use of UEs to self assist  Ambulation/Gait Ambulation/Gait assistance: Min guard;Supervision Gait Distance (Feet): 400 Feet Assistive device: Rolling walker (2 wheeled) Gait Pattern/deviations: Step-through pattern;Decreased step length - right;Decreased step length - left;Shuffle;Trunk flexed Gait velocity: decr   General Gait Details: cues for posture and position from RW; increased time with standing rest breaks required to complete task   Stairs             Wheelchair Mobility    Modified Rankin (Stroke Patients Only)       Balance Overall balance assessment: Mild deficits observed, not formally tested                                          Cognition Arousal/Alertness: Awake/alert Behavior During Therapy: WFL for tasks assessed/performed Overall Cognitive Status: Within Functional  Limits for tasks assessed                                        Exercises      General Comments        Pertinent Vitals/Pain Pain Assessment: 0-10 Pain Score: 4  Pain Location: abdomen Pain Descriptors / Indicators: Guarding;Sore Pain Intervention(s): Limited activity within patient's tolerance;Monitored during session;Premedicated before session    Home Living                      Prior Function            PT Goals (current goals can now be found in the care plan section) Acute Rehab PT Goals Patient Stated Goal: walk independently PT Goal Formulation: With patient Time For Goal Achievement: 02/17/20 Potential to Achieve Goals: Good Progress towards PT goals: Progressing toward goals    Frequency    Min 3X/week      PT Plan Current plan remains appropriate    Co-evaluation              AM-PAC PT "6 Clicks" Mobility   Outcome Measure  Help needed turning from your back to your side while in a flat bed without using bedrails?: A Little Help needed moving from lying on your back to sitting  on the side of a flat bed without using bedrails?: A Lot Help needed moving to and from a bed to a chair (including a wheelchair)?: A Little Help needed standing up from a chair using your arms (e.g., wheelchair or bedside chair)?: A Little Help needed to walk in hospital room?: A Little Help needed climbing 3-5 steps with a railing? : A Little 6 Click Score: 17    End of Session   Activity Tolerance: Patient tolerated treatment well Patient left: in chair;with call bell/phone within reach;with chair alarm set Nurse Communication: Mobility status PT Visit Diagnosis: Unsteadiness on feet (R26.81);Difficulty in walking, not elsewhere classified (R26.2)     Time: 1638-4665 PT Time Calculation (min) (ACUTE ONLY): 19 min  Charges:  $Gait Training: 8-22 mins                     [place] Pager  [phone] Office [phone]    Cindy Peterson 02/05/2020, 1:45 PM

## 2020-02-05 NOTE — Progress Notes (Signed)
Pt complains of nausea w/out emesis. Po intake very poor. Have walked pt 4 times today, including her being up in the chair for several hours as well.Only passing gas via colostomy, zero stool noted. Stoma noted prolapsed approx 2-3 inches, pink in color. Pt is requesting that we put her ng tube back down, to "relieve the pressure" on her belly. Explained to pt that an ng tube is a temp fix and that she is full of stool (per xray) and it must come out via her ostomy. Call to Dr Donne Hazel to discuss, will cont to encourage po's, ambulate and monitor pt.

## 2020-02-05 NOTE — Progress Notes (Signed)
PROGRESS NOTE    Cindy Peterson  OMV:[id number] DOB: Jul 14, 1965 DOA: 01/24/2020 PCP: Karlene Einstein, MD     Brief Narrative:  54 years BF PMHx Colon cancer s/p colectomy, colostomy and parastomal hernia, recurrent large bowel obstruction, DM type II controlled, essential HTN, PCOS  Presented to the emergency room with abdominal pain and poor output from ostomy.  Patient was then admitted to hospital for treatment of bowel obstruction.  Patient was initially conservatively treated but her symptoms did not improve so patient underwent surgical intervention on 01/31/2020.    Subjective: 12/11 afebrile overnight, A/O x4, understands that she is going to have to stay for 24-48 more hours.  In addition prior to leaving she is going to have to have a safe discharge plan that is solidified  Assessment & Plan: Covid vaccination; vaccinated   Principal Problem:   Small bowel obstruction ([place]) Active Problems:   Parastomal hernia   Essential hypertension   Acid reflux   Morbid obesity with BMI of 45.0-49.9, adult (HCC)   PCOS (polycystic ovarian syndrome)   Primary insomnia   Colostomy in place [facility])   Constipation, chronic   Partial small bowel obstruction/parastomal hernia/ileus with constipation: -Patient was treated conservatively initially but did not improve and Failed NG tube clamping trial with significant NG output and abdominal discomfort.  Patient subsequently underwent.exploratory laparotomy with parastomal hernia repair with mesh on 01/31/2020.  Postoperative management as per general surgery  Essential hypertension: . -Hydralazine 10 mgPRN  DM type II controlled  -On Metformin at home.   -Sensitive SSI     Hypokalemia -Potassium goal> 4  Hypophosphatemia -Phosphorus goal> 2.5 -12/11 K-Phos 30 mmol  Malnutrition -12/11 placed on clear liquid diet by surgery today.  Goals of care -1.  Must be able to tolerate solid food prior to discharge -2.  Must have a  solid/safe plan for discharge.  Patient states her plan is to stay with her sister or aunt, which would be a safe discharge plan.  LCSW Elmyra Ricks is planning to work with patient on discharge plan over the weekend.   DVT prophylaxis: Lovenox Code Status: Full Family Communication: 12/11 friend at bedside for discussion of plan of care answered all questions Status is: Inpatient    Dispo: The patient is from: Home              Anticipated d/c is to: Home              Anticipated d/c date is: Per surgery              Patient currently unstable      Consultants:  General surgery  Procedures/Significant Events:    I have personally reviewed and interpreted all radiology studies and my findings are as above.  VENTILATOR SETTINGS:    Cultures   Antimicrobials: Anti-infectives (From admission, onward)   Start     Ordered Stop   01/31/20 1045  ceFAZolin (ANCEF) IVPB 2g/100 mL premix        01/31/20 0921 01/31/20 1236       Devices    LINES / TUBES:      Continuous Infusions:    Objective: Vitals:   02/04/20 1506 02/04/20 1604 02/04/20 2058 02/05/20 0503  BP: (!) 128/95 118/88 (!) 147/89 (!) 145/92  Pulse: (!) 107 (!) 106 98 (!) 101  Resp: 18  16 14   Temp: 98 F (36.7 C)  98.1 F (36.7 C) 97.8 F (36.6 C)  TempSrc: Oral  SpO2: 98%  100% 100%  Weight:      Height:        Intake/Output Summary (Last 24 hours) at 02/05/2020 1347 Last data filed at 02/05/2020 1333 Gross per 24 hour  Intake 750 ml  Output 530 ml  Net 220 ml   Filed Weights   01/24/20 1655 01/31/20 1048  Weight: 122.5 kg 124.7 kg    Examination:  General: A/O x4, No acute respiratory distress Eyes: negative scleral hemorrhage, negative anisocoria, negative icterus ENT: Negative Runny nose, negative gingival bleeding, Neck:  Negative scars, masses, torticollis, lymphadenopathy, JVD Lungs: Clear to auscultation bilaterally without wheezes or crackles Cardiovascular: Regular  rate and rhythm without murmur gallop or rub normal S1 and S2 Abdomen: Positive appropriate tenderness abdominal pain, positive distended, negative soft, bowel sounds, no rebound, no ascites, no appreciable mass, ostomy in place pink, negative sign of overt bleed.  JP drain in place positive serosanguineous discharge. Extremities: No significant cyanosis, clubbing, or edema bilateral lower extremities Skin: Negative rashes, lesions, ulcers Psychiatric:  Negative depression, negative anxiety, negative fatigue, negative mania  Central nervous system:  Cranial nerves II through XII intact, tongue/uvula midline, all extremities muscle strength 5/5, sensation intact throughout, negative dysarthria, negative expressive aphasia, negative receptive aphasia.  .     Data Reviewed: Care during the described time interval was provided by me .  I have reviewed this patient's available data, including medical history, events of note, physical examination, and all test results as part of my evaluation.  CBC: Recent Labs  Lab 01/30/20 0603 01/31/20 0519 02/01/20 0535 02/02/20 0547 02/03/20 0454 02/04/20 0539 02/05/20 0552  WBC 7.5   < > 9.7 11.6* 11.0* 8.5 8.4  NEUTROABS 5.6  --   --   --  8.2* 5.8 5.2  HGB 12.1   < > 12.4 11.3* 10.4* 11.2* 9.8*  HCT 38.0   < > 39.0 36.2 33.2* 35.9* 31.4*  MCV 84.3   < > 84.8 86.0 84.7 86.1 85.6  PLT 280   < > 351 336 287 311 309   < > = values in this interval not displayed.   Basic Metabolic Panel: Recent Labs  Lab 01/31/20 0519 02/01/20 0535 02/02/20 0547 02/03/20 0454 02/04/20 0539 02/05/20 0552  NA 136 135 132* 134* 135 134*  K 3.7 3.7 3.7 3.8  3.4* 3.9 3.7  CL 92* 95* 95* 98 100 99  CO2 30 24 22 24 25 24   GLUCOSE 84 98 82 146* 111* 104*  BUN 7 7 7  5* 7 6  CREATININE 0.61 0.64 0.66 0.53 0.51 0.56  CALCIUM 8.3* 7.9* 7.9* 7.8* 8.1* 7.9*  MG 2.4 2.2 2.3 1.9  2.0 2.2 2.2  PHOS 2.8 3.4 2.5  2.4*  --  2.1* 2.0*   GFR: Estimated Creatinine  Clearance: 105.6 mL/min (by C-G formula based on SCr of 0.56 mg/dL). Liver Function Tests: Recent Labs  Lab 02/01/20 0535 02/02/20 0547 02/03/20 0454 02/04/20 0539 02/05/20 0552  AST 30 21 15 24 22   ALT 50* 36 26 30 29   ALKPHOS 62 71 61 81 74  BILITOT 1.6* 1.5* 1.1 1.0 0.9  PROT 6.4* 6.4* 6.0* 6.7 6.4*  ALBUMIN 3.3* 3.0* 2.8* 3.1* 2.9*   No results for input(s): LIPASE, AMYLASE in the last 168 hours. No results for input(s): AMMONIA in the last 168 hours. Coagulation Profile: No results for input(s): INR, PROTIME in the last 168 hours. Cardiac Enzymes: No results for input(s): CKTOTAL, CKMB, CKMBINDEX, TROPONINI in the last  168 hours. BNP (last 3 results) No results for input(s): PROBNP in the last 8760 hours. HbA1C: No results for input(s): HGBA1C in the last 72 hours. CBG: Recent Labs  Lab 02/04/20 1135 02/04/20 1720 02/04/20 2359 02/05/20 0503 02/05/20 1133  GLUCAP 118* 103* 101* 104* 97   Lipid Profile: No results for input(s): CHOL, HDL, LDLCALC, TRIG, CHOLHDL, LDLDIRECT in the last 72 hours. Thyroid Function Tests: No results for input(s): TSH, T4TOTAL, FREET4, T3FREE, THYROIDAB in the last 72 hours. Anemia Panel: No results for input(s): VITAMINB12, FOLATE, FERRITIN, TIBC, IRON, RETICCTPCT in the last 72 hours. Sepsis Labs: No results for input(s): PROCALCITON, LATICACIDVEN in the last 168 hours.  Recent Results (from the past 240 hour(s))  Surgical pcr screen     Status: None   Collection Time: 01/31/20 10:28 AM   Specimen: Nasal Mucosa; Nasal Swab  Result Value Ref Range Status   MRSA, PCR NEGATIVE NEGATIVE Final   Staphylococcus aureus NEGATIVE NEGATIVE Final    Comment: (NOTE) The Xpert SA Assay (FDA approved for NASAL specimens in patients 70 years of age and older), is one component of a comprehensive surveillance program. It is not intended to diagnose infection nor to guide or monitor treatment. Performed at [facility], [place] [address] [place], [place] [address]          Radiology Studies: DG Abd 1 View  Result Date: 02/05/2020 CLINICAL DATA:  Nausea and vomiting. EXAM: ABDOMEN - 1 VIEW COMPARISON:  01/31/2020 and prior FINDINGS: Gas is seen within nondilated bowel loops. Moderate colorectal stool burden. No evidence of pneumoperitoneum. Right upper quadrant surgical clips. Multilevel spondylosis. IMPRESSION: Nonobstructive bowel gas pattern.  Moderate stool burden. Electronically Signed   By: Primitivo Gauze M.D.   On: 02/05/2020 07:57        Scheduled Meds: . acetaminophen  650 mg Oral Q6H  . bisacodyl  10 mg Rectal Once  . enoxaparin (LOVENOX) injection  40 mg Subcutaneous Q24H  . feeding supplement  237 mL Oral BID BM  . gabapentin  300 mg Oral TID  . insulin aspart  0-9 Units Subcutaneous Q6H  . methocarbamol  1,000 mg Oral TID  . polyethylene glycol  17 g Oral Daily   Continuous Infusions:    LOS: 12 days    Time spent:40 min    Oaklyn Mans, Geraldo Docker, MD [facility] Pager [phone]  If 7PM-7AM, please contact night-coverage [email or web address] Password [email or web address] 02/05/2020, 1:47 PM

## 2020-02-05 NOTE — Progress Notes (Signed)
5 Days Post-Op   Subjective/Chief Complaint: "projectile vomiting" last night, npo this am feels a little better   Objective: Vital signs in last 24 hours: Temp:  [97.8 F (36.6 C)-98.1 F (36.7 C)] 97.8 F (36.6 C) (12/11 0503) Pulse Rate:  [98-107] 101 (12/11 0503) Resp:  [14-18] 14 (12/11 0503) BP: (118-147)/(88-95) 145/92 (12/11 0503) SpO2:  [98 %-100 %] 100 % (12/11 0503) Last BM Date: 01/29/20  Intake/Output from previous day: 12/10 0701 - 12/11 0700 In: 330 [P.O.:330] Out: 230 [Urine:200; Drains:30] Intake/Output this shift: No intake/output data recorded.  cv rrr Lungs clearn Ab obese, stoma with some prolapse and minimal gas in bag, incision clean, drain serosang, approp tender  Lab Results:  Recent Labs    02/04/20 0539 02/05/20 0552  WBC 8.5 8.4  HGB 11.2* 9.8*  HCT 35.9* 31.4*  PLT 311 309   BMET Recent Labs    02/04/20 0539 02/05/20 0552  NA 135 134*  K 3.9 3.7  CL 100 99  CO2 25 24  GLUCOSE 111* 104*  BUN 7 6  CREATININE 0.51 0.56  CALCIUM 8.1* 7.9*   PT/INR No results for input(s): LABPROT, INR in the last 72 hours. ABG No results for input(s): PHART, HCO3 in the last 72 hours.  Invalid input(s): PCO2, PO2  Studies/Results: DG Abd 1 View  Result Date: 02/05/2020 CLINICAL DATA:  Nausea and vomiting. EXAM: ABDOMEN - 1 VIEW COMPARISON:  01/31/2020 and prior FINDINGS: Gas is seen within nondilated bowel loops. Moderate colorectal stool burden. No evidence of pneumoperitoneum. Right upper quadrant surgical clips. Multilevel spondylosis. IMPRESSION: Nonobstructive bowel gas pattern.  Moderate stool burden. Electronically Signed   By: Primitivo Gauze M.D.   On: 02/05/2020 07:57    Anti-infectives: Anti-infectives (From admission, onward)   Start     Dose/Rate Route Frequency Ordered Stop   01/31/20 1045  ceFAZolin (ANCEF) IVPB 2g/100 mL premix        2 g 200 mL/hr over 30 Minutes Intravenous  Once 01/31/20 2947 01/31/20 1236       Assessment/Plan: POD 5 repair parastomal and incisional hernia -emesis overnight made npo, xray not concerning this am but clinically has some ileus, will do clear today, encouraged ambulation, will start miralax and give dulcolax also - continue to mobilize - remove JP prior to d/c -home next 24-48 hours pending diet tolerance PCOS, HTN, Asthma- home meds MLY:YTKPT liquids ID: None VTE: Lovenox   Cindy Peterson 02/05/2020

## 2020-02-06 LAB — COMPREHENSIVE METABOLIC PANEL
ALT: 38 U/L (ref 0–44)
AST: 36 U/L (ref 15–41)
Albumin: 2.9 g/dL — ABNORMAL LOW (ref 3.5–5.0)
Alkaline Phosphatase: 71 U/L (ref 38–126)
Anion gap: 11 (ref 5–15)
BUN: <5 mg/dL — ABNORMAL LOW (ref 6–20)
CO2: 25 mmol/L (ref 22–32)
Calcium: 8.1 mg/dL — ABNORMAL LOW (ref 8.9–10.3)
Chloride: 100 mmol/L (ref 98–111)
Creatinine, Ser: 0.57 mg/dL (ref 0.44–1.00)
GFR, Estimated: >60 mL/min (ref >60)
Glucose, Bld: 102 mg/dL — ABNORMAL HIGH (ref 70–99)
Potassium: 3.7 mmol/L (ref 3.5–5.1)
Sodium: 136 mmol/L (ref 135–145)
Total Bilirubin: 0.4 mg/dL (ref 0.3–1.2)
Total Protein: 6.3 g/dL — ABNORMAL LOW (ref 6.5–8.1)

## 2020-02-06 LAB — CBC WITH DIFFERENTIAL/PLATELET
Abs Immature Granulocytes: 0.3 10*3/uL — ABNORMAL HIGH (ref 0.00–0.07)
Basophils Absolute: 0 10*3/uL (ref 0.0–0.1)
Basophils Relative: 0 %
Eosinophils Absolute: 0.2 10*3/uL (ref 0.0–0.5)
Eosinophils Relative: 3 %
HCT: 29.8 % — ABNORMAL LOW (ref 36.0–46.0)
Hemoglobin: 9.4 g/dL — ABNORMAL LOW (ref 12.0–15.0)
Immature Granulocytes: 4 %
Lymphocytes Relative: 20 %
Lymphs Abs: 1.7 10*3/uL (ref 0.7–4.0)
MCH: 26.8 pg (ref 26.0–34.0)
MCHC: 31.5 g/dL (ref 30.0–36.0)
MCV: 84.9 fL (ref 80.0–100.0)
Monocytes Absolute: 0.8 10*3/uL (ref 0.1–1.0)
Monocytes Relative: 10 %
Neutro Abs: 5.2 10*3/uL (ref 1.7–7.7)
Neutrophils Relative %: 63 %
Platelets: 301 10*3/uL (ref 150–400)
RBC: 3.51 MIL/uL — ABNORMAL LOW (ref 3.87–5.11)
RDW: 15.9 % — ABNORMAL HIGH (ref 11.5–15.5)
WBC: 8.2 10*3/uL (ref 4.0–10.5)
nRBC: 0.2 % (ref 0.0–0.2)

## 2020-02-06 LAB — GLUCOSE, CAPILLARY
Glucose-Capillary: 109 mg/dL — ABNORMAL HIGH (ref 70–99)
Glucose-Capillary: 80 mg/dL (ref 70–99)
Glucose-Capillary: 89 mg/dL (ref 70–99)
Glucose-Capillary: 96 mg/dL (ref 70–99)

## 2020-02-06 LAB — PHOSPHORUS: Phosphorus: 3.3 mg/dL (ref 2.5–4.6)

## 2020-02-06 LAB — MAGNESIUM: Magnesium: 2.1 mg/dL (ref 1.7–2.4)

## 2020-02-06 NOTE — Progress Notes (Signed)
6 Days Post-Op   Subjective/Chief Complaint: Pt mobilizing yesterday Tol some clears Still min bowel fxn    Objective: Vital signs in last 24 hours: Temp:  [98.4 F (36.9 C)-98.8 F (37.1 C)] 98.6 F (37 C) (12/12 0606) Pulse Rate:  [91-94] 94 (12/12 0606) Resp:  [17-18] 18 (12/12 0606) BP: (117-140)/(64-84) 140/64 (12/12 0606) SpO2:  [99 %-100 %] 99 % (12/12 0606) Last BM Date: 01/29/20  Intake/Output from previous day: 12/11 0701 - 12/12 0700 In: 906.2 [P.O.:880; IV Piggyback:26.2] Out: 2313 [Urine:2300; Drains:13] Intake/Output this shift: Total I/O In: -  Out: 625 [Urine:625]  General appearance: alert and cooperative GI: soft, non-tender; bowel sounds normal; no masses,  no organomegaly and ostomy pink, inc cdi  Lab Results:  Recent Labs    02/04/20 0539 02/05/20 0552  WBC 8.5 8.4  HGB 11.2* 9.8*  HCT 35.9* 31.4*  PLT 311 309   BMET Recent Labs    02/04/20 0539 02/05/20 0552  NA 135 134*  K 3.9 3.7  CL 100 99  CO2 25 24  GLUCOSE 111* 104*  BUN 7 6  CREATININE 0.51 0.56  CALCIUM 8.1* 7.9*   PT/INR No results for input(s): LABPROT, INR in the last 72 hours. ABG No results for input(s): PHART, HCO3 in the last 72 hours.  Invalid input(s): PCO2, PO2  Studies/Results: DG Abd 1 View  Result Date: 02/05/2020 CLINICAL DATA:  Nausea and vomiting. EXAM: ABDOMEN - 1 VIEW COMPARISON:  01/31/2020 and prior FINDINGS: Gas is seen within nondilated bowel loops. Moderate colorectal stool burden. No evidence of pneumoperitoneum. Right upper quadrant surgical clips. Multilevel spondylosis. IMPRESSION: Nonobstructive bowel gas pattern.  Moderate stool burden. Electronically Signed   By: Primitivo Gauze M.D.   On: 02/05/2020 07:57    Anti-infectives: Anti-infectives (From admission, onward)   Start     Dose/Rate Route Frequency Ordered Stop   01/31/20 1045  ceFAZolin (ANCEF) IVPB 2g/100 mL premix        2 g 200 mL/hr over 30 Minutes Intravenous  Once  01/31/20 4401 01/31/20 1236      Assessment/Plan: POD 6 repair parastomal and incisional hernia -no emesis but clinically has some ileus, will do Fulls  today, encouraged ambulation, will start miralax and give dulcolax also - continue to mobilize -remove JP prior to d/c -home next 24-48 hours pending diet tolerance PCOS, HTN, Asthma- home meds UUV:OZDG liquids ID: None VTE: Lovenox   LOS: 13 days     Ralene Ok 02/06/2020

## 2020-02-06 NOTE — Progress Notes (Signed)
PROGRESS NOTE    Cindy Peterson  NTZ:[id number] DOB: 1965/12/24 DOA: 01/24/2020 PCP: Karlene Einstein, MD     Brief Narrative:  54 years BF PMHx Colon cancer s/p colectomy, colostomy and parastomal hernia, recurrent large bowel obstruction, DM type II controlled, essential HTN, PCOS  Presented to the emergency room with abdominal pain and poor output from ostomy.  Patient was then admitted to hospital for treatment of bowel obstruction.  Patient was initially conservatively treated but her symptoms did not improve so patient underwent surgical intervention on 01/31/2020.    Subjective: 12/12 afebrile overnight A/O x4, somewhat sleepy today but answers questions appropriately follows all commands.   Assessment & Plan: Covid vaccination; vaccinated   Principal Problem:   Small bowel obstruction ([place]) Active Problems:   Parastomal hernia   Essential hypertension   Acid reflux   Morbid obesity with BMI of 45.0-49.9, adult (HCC)   PCOS (polycystic ovarian syndrome)   Primary insomnia   Colostomy in place [facility])   Constipation, chronic   Partial small bowel obstruction/parastomal hernia/ileus with constipation: -Patient was treated conservatively initially but did not improve and Failed NG tube clamping trial with significant NG output and abdominal discomfort.  Patient subsequently underwent.exploratory laparotomy with parastomal hernia repair with mesh on 01/31/2020.  Postoperative management as per general surgery -12/12 surgery still concerned patient has ileus see their note.  Essential hypertension: . -Hydralazine 10 mgPRN  DM type II controlled  -On Metformin at home.   -Sensitive SSI     Hypokalemia -Potassium goal> 4  Hypophosphatemia -Phosphorus goal> 2.5  Malnutrition -12/12 surgery advanced diet to full liquid diet today.    Goals of care -1.  Must be able to tolerate solid food prior to discharge -2.  Must have a solid/safe plan for discharge.  Patient  states her plan is to stay with her sister or aunt, which would be a safe discharge plan.  LCSW Elmyra Ricks is planning to work with patient on discharge plan over the weekend.   DVT prophylaxis: Lovenox Code Status: Full Family Communication: 12/12 friend at bedside for discussion of plan of care answered all questions Status is: Inpatient    Dispo: The patient is from: Home              Anticipated d/c is to: Home              Anticipated d/c date is: Per surgery              Patient currently unstable      Consultants:  General surgery  Procedures/Significant Events:    I have personally reviewed and interpreted all radiology studies and my findings are as above.  VENTILATOR SETTINGS:    Cultures   Antimicrobials: Anti-infectives (From admission, onward)   Start     Ordered Stop   01/31/20 1045  ceFAZolin (ANCEF) IVPB 2g/100 mL premix        01/31/20 0921 01/31/20 1236       Devices    LINES / TUBES:      Continuous Infusions:    Objective: Vitals:   02/05/20 0503 02/05/20 1359 02/05/20 2148 02/06/20 0606  BP: (!) 145/92 127/84 117/65 140/64  Pulse: (!) 101 91 94 94  Resp: 14 18 17 18   Temp: 97.8 F (36.6 C) 98.4 F (36.9 C) 98.8 F (37.1 C) 98.6 F (37 C)  TempSrc:  Oral Oral Oral  SpO2: 100% 100% 100% 99%  Weight:      Height:  Intake/Output Summary (Last 24 hours) at 02/06/2020 1050 Last data filed at 02/06/2020 0800 Gross per 24 hour  Intake 846.18 ml  Output 2638 ml  Net -1791.82 ml   Filed Weights   01/24/20 1655 01/31/20 1048  Weight: 122.5 kg 124.7 kg    Examination:  General: A/O x4, No acute respiratory distress Eyes: negative scleral hemorrhage, negative anisocoria, negative icterus ENT: Negative Runny nose, negative gingival bleeding, Neck:  Negative scars, masses, torticollis, lymphadenopathy, JVD Lungs: Clear to auscultation bilaterally without wheezes or crackles Cardiovascular: Regular rate and rhythm  without murmur gallop or rub normal S1 and S2 Abdomen: Positive appropriate tenderness abdominal pain, positive distended, negative soft, bowel sounds, no rebound, no ascites, no appreciable mass, ostomy in place pink, negative sign of overt bleed.  JP drain in place positive serosanguineous discharge. Extremities: No significant cyanosis, clubbing, or edema bilateral lower extremities Skin: Negative rashes, lesions, ulcers Psychiatric:  Negative depression, negative anxiety, negative fatigue, negative mania  Central nervous system:  Cranial nerves II through XII intact, tongue/uvula midline, all extremities muscle strength 5/5, sensation intact throughout, negative dysarthria, negative expressive aphasia, negative receptive aphasia.  .     Data Reviewed: Care during the described time interval was provided by me .  I have reviewed this patient's available data, including medical history, events of note, physical examination, and all test results as part of my evaluation.  CBC: Recent Labs  Lab 02/02/20 0547 02/03/20 0454 02/04/20 0539 02/05/20 0552 02/06/20 0805  WBC 11.6* 11.0* 8.5 8.4 8.2  NEUTROABS  --  8.2* 5.8 5.2 5.2  HGB 11.3* 10.4* 11.2* 9.8* 9.4*  HCT 36.2 33.2* 35.9* 31.4* 29.8*  MCV 86.0 84.7 86.1 85.6 84.9  PLT 336 287 311 309 902   Basic Metabolic Panel: Recent Labs  Lab 02/01/20 0535 02/02/20 0547 02/03/20 0454 02/04/20 0539 02/05/20 0552 02/06/20 0805  NA 135 132* 134* 135 134* 136  K 3.7 3.7 3.8  3.4* 3.9 3.7 3.7  CL 95* 95* 98 100 99 100  CO2 24 22 24 25 24 25   GLUCOSE 98 82 146* 111* 104* 102*  BUN 7 7 5* 7 6 <5*  CREATININE 0.64 0.66 0.53 0.51 0.56 0.57  CALCIUM 7.9* 7.9* 7.8* 8.1* 7.9* 8.1*  MG 2.2 2.3 1.9  2.0 2.2 2.2 2.1  PHOS 3.4 2.5  2.4*  --  2.1* 2.0* 3.3   GFR: Estimated Creatinine Clearance: 105.6 mL/min (by C-G formula based on SCr of 0.57 mg/dL). Liver Function Tests: Recent Labs  Lab 02/02/20 0547 02/03/20 0454 02/04/20 0539  02/05/20 0552 02/06/20 0805  AST 21 15 24 22  36  ALT 36 26 30 29  38  ALKPHOS 71 61 81 74 71  BILITOT 1.5* 1.1 1.0 0.9 0.4  PROT 6.4* 6.0* 6.7 6.4* 6.3*  ALBUMIN 3.0* 2.8* 3.1* 2.9* 2.9*   No results for input(s): LIPASE, AMYLASE in the last 168 hours. No results for input(s): AMMONIA in the last 168 hours. Coagulation Profile: No results for input(s): INR, PROTIME in the last 168 hours. Cardiac Enzymes: No results for input(s): CKTOTAL, CKMB, CKMBINDEX, TROPONINI in the last 168 hours. BNP (last 3 results) No results for input(s): PROBNP in the last 8760 hours. HbA1C: No results for input(s): HGBA1C in the last 72 hours. CBG: Recent Labs  Lab 02/05/20 0503 02/05/20 1133 02/05/20 1741 02/05/20 2359 02/06/20 0608  GLUCAP 104* 97 82 89 109*   Lipid Profile: No results for input(s): CHOL, HDL, LDLCALC, TRIG, CHOLHDL, LDLDIRECT in the last  72 hours. Thyroid Function Tests: No results for input(s): TSH, T4TOTAL, FREET4, T3FREE, THYROIDAB in the last 72 hours. Anemia Panel: No results for input(s): VITAMINB12, FOLATE, FERRITIN, TIBC, IRON, RETICCTPCT in the last 72 hours. Sepsis Labs: No results for input(s): PROCALCITON, LATICACIDVEN in the last 168 hours.  Recent Results (from the past 240 hour(s))  Surgical pcr screen     Status: None   Collection Time: 01/31/20 10:28 AM   Specimen: Nasal Mucosa; Nasal Swab  Result Value Ref Range Status   MRSA, PCR NEGATIVE NEGATIVE Final   Staphylococcus aureus NEGATIVE NEGATIVE Final    Comment: (NOTE) The Xpert SA Assay (FDA approved for NASAL specimens in patients 13 years of age and older), is one component of a comprehensive surveillance program. It is not intended to diagnose infection nor to guide or monitor treatment. Performed at [facility], [place] [address], [place], [place] [address]          Radiology Studies: DG Abd 1 View  Result Date: 02/05/2020 CLINICAL DATA:  Nausea and vomiting. EXAM:  ABDOMEN - 1 VIEW COMPARISON:  01/31/2020 and prior FINDINGS: Gas is seen within nondilated bowel loops. Moderate colorectal stool burden. No evidence of pneumoperitoneum. Right upper quadrant surgical clips. Multilevel spondylosis. IMPRESSION: Nonobstructive bowel gas pattern.  Moderate stool burden. Electronically Signed   By: Primitivo Gauze M.D.   On: 02/05/2020 07:57        Scheduled Meds: . acetaminophen  650 mg Oral Q6H  . bisacodyl  10 mg Rectal Once  . enoxaparin (LOVENOX) injection  40 mg Subcutaneous Q24H  . feeding supplement  237 mL Oral BID BM  . gabapentin  300 mg Oral TID  . insulin aspart  0-9 Units Subcutaneous Q6H  . methocarbamol  1,000 mg Oral TID  . polyethylene glycol  17 g Oral Daily   Continuous Infusions:    LOS: 13 days    Time spent:40 min    Rondall Radigan, Geraldo Docker, MD [facility] Pager [phone]  If 7PM-7AM, please contact night-coverage [email or web address] Password [email or web address] 02/06/2020, 10:50 AM

## 2020-02-06 NOTE — Progress Notes (Signed)
Physical Therapy Treatment Patient Details Name: Cindy Peterson MRN: [id number] DOB: [birth year] Today's Date: 02/06/2020    History of Present Illness Pt admitted with SBO and now s/p parasomal and ventral hernia repair.  Pt with hx of colon CA    PT Comments    Pt limited by abdominal discomfort/distention and limited ability to breathe deeply but with marked improvement in posture and stability with ambulation.  Pt progressed to ambulation sans AD with only mild intermittent instability and no overt LOB.   Follow Up Recommendations  No PT follow up     Equipment Recommendations  None recommended by PT (Pt has progressed to no longer requiring RW for ambulation)    Recommendations for Other Services       Precautions / Restrictions Precautions Precautions: Fall;Other (comment) Precaution Comments: JP drain on R Restrictions Weight Bearing Restrictions: No    Mobility  Bed Mobility               General bed mobility comments: Pt up in chair and requests back to same  Transfers Overall transfer level: Needs assistance Equipment used: Rolling walker (2 wheeled) Transfers: Sit to/from Stand Sit to Stand: Supervision         General transfer comment: min cues for use of UEs to self assist  Ambulation/Gait Ambulation/Gait assistance: Min guard;Supervision Gait Distance (Feet): 300 Feet (and additional 200' following seated rest break) Assistive device: None Gait Pattern/deviations: Step-through pattern;Decreased step length - right;Decreased step length - left;Shuffle;Wide base of support Gait velocity: decr   General Gait Details: slow pace, shuffling gait, ltd by abdominal distention/discomfort and decreased ability to breathe deeply   Stairs             Wheelchair Mobility    Modified Rankin (Stroke Patients Only)       Balance Overall balance assessment: Mild deficits observed, not formally tested                                           Cognition Arousal/Alertness: Awake/alert Behavior During Therapy: WFL for tasks assessed/performed Overall Cognitive Status: Within Functional Limits for tasks assessed                                        Exercises      General Comments        Pertinent Vitals/Pain Pain Assessment: 0-10 Pain Score: 2  Pain Location: abdomen Pain Descriptors / Indicators: Guarding;Sore Pain Intervention(s): Limited activity within patient's tolerance;Monitored during session    Home Living                      Prior Function            PT Goals (current goals can now be found in the care plan section) Acute Rehab PT Goals Patient Stated Goal: walk independently PT Goal Formulation: With patient Time For Goal Achievement: 02/17/20 Potential to Achieve Goals: Good Progress towards PT goals: Progressing toward goals    Frequency    Min 3X/week      PT Plan Current plan remains appropriate    Co-evaluation              AM-PAC PT "6 Clicks" Mobility   Outcome Measure  Help needed turning from your back to  your side while in a flat bed without using bedrails?: A Little Help needed moving from lying on your back to sitting on the side of a flat bed without using bedrails?: A Lot Help needed moving to and from a bed to a chair (including a wheelchair)?: A Little Help needed standing up from a chair using your arms (e.g., wheelchair or bedside chair)?: A Little Help needed to walk in hospital room?: A Little Help needed climbing 3-5 steps with a railing? : A Little 6 Click Score: 17    End of Session   Activity Tolerance: Patient tolerated treatment well Patient left: in chair;with call bell/phone within reach;with family/visitor present Nurse Communication: Mobility status PT Visit Diagnosis: Unsteadiness on feet (R26.81);Difficulty in walking, not elsewhere classified (R26.2)     Time: 9169-4503 PT Time Calculation (min)  (ACUTE ONLY): 30 min  Charges:  $Gait Training: 23-37 mins                     [place] Pager [phone] Office [phone]    [place] 02/06/2020, 5:15 PM

## 2020-02-07 LAB — CBC WITH DIFFERENTIAL/PLATELET
Abs Immature Granulocytes: 0.26 10*3/uL — ABNORMAL HIGH (ref 0.00–0.07)
Basophils Absolute: 0 10*3/uL (ref 0.0–0.1)
Basophils Relative: 0 %
Eosinophils Absolute: 0.2 10*3/uL (ref 0.0–0.5)
Eosinophils Relative: 3 %
HCT: 33.9 % — ABNORMAL LOW (ref 36.0–46.0)
Hemoglobin: 10.6 g/dL — ABNORMAL LOW (ref 12.0–15.0)
Immature Granulocytes: 3 %
Lymphocytes Relative: 22 %
Lymphs Abs: 2.1 10*3/uL (ref 0.7–4.0)
MCH: 27 pg (ref 26.0–34.0)
MCHC: 31.3 g/dL (ref 30.0–36.0)
MCV: 86.5 fL (ref 80.0–100.0)
Monocytes Absolute: 0.8 10*3/uL (ref 0.1–1.0)
Monocytes Relative: 9 %
Neutro Abs: 6 10*3/uL (ref 1.7–7.7)
Neutrophils Relative %: 63 %
Platelets: 378 10*3/uL (ref 150–400)
RBC: 3.92 MIL/uL (ref 3.87–5.11)
RDW: 16.1 % — ABNORMAL HIGH (ref 11.5–15.5)
WBC: 9.5 10*3/uL (ref 4.0–10.5)
nRBC: 0.2 % (ref 0.0–0.2)

## 2020-02-07 LAB — COMPREHENSIVE METABOLIC PANEL
ALT: 50 U/L — ABNORMAL HIGH (ref 0–44)
AST: 52 U/L — ABNORMAL HIGH (ref 15–41)
Albumin: 3.2 g/dL — ABNORMAL LOW (ref 3.5–5.0)
Alkaline Phosphatase: 80 U/L (ref 38–126)
Anion gap: 12 (ref 5–15)
BUN: <5 mg/dL — ABNORMAL LOW (ref 6–20)
CO2: 25 mmol/L (ref 22–32)
Calcium: 8.8 mg/dL — ABNORMAL LOW (ref 8.9–10.3)
Chloride: 101 mmol/L (ref 98–111)
Creatinine, Ser: 0.61 mg/dL (ref 0.44–1.00)
GFR, Estimated: >60 mL/min (ref >60)
Glucose, Bld: 95 mg/dL (ref 70–99)
Potassium: 4.5 mmol/L (ref 3.5–5.1)
Sodium: 138 mmol/L (ref 135–145)
Total Bilirubin: 0.4 mg/dL (ref 0.3–1.2)
Total Protein: 7 g/dL (ref 6.5–8.1)

## 2020-02-07 LAB — PHOSPHORUS: Phosphorus: 3.5 mg/dL (ref 2.5–4.6)

## 2020-02-07 LAB — GLUCOSE, CAPILLARY
Glucose-Capillary: 85 mg/dL (ref 70–99)
Glucose-Capillary: 85 mg/dL (ref 70–99)
Glucose-Capillary: 90 mg/dL (ref 70–99)
Glucose-Capillary: 93 mg/dL (ref 70–99)
Glucose-Capillary: 94 mg/dL (ref 70–99)

## 2020-02-07 LAB — MAGNESIUM: Magnesium: 2.3 mg/dL (ref 1.7–2.4)

## 2020-02-07 NOTE — Progress Notes (Addendum)
Nutrition Follow-up  DOCUMENTATION CODES:   Morbid obesity  INTERVENTION:   -Ensure Surgery BID, each provides 330 kcals and 18g protein  NUTRITION DIAGNOSIS:   Increased nutrient needs related to acute illness,post-op healing (SBO) as evidenced by estimated needs.  Ongoing.  GOAL:   Patient will meet greater than or equal to 90% of their needs  Progressing.  MONITOR:   Diet advancement,Weight trends,I & O's,Labs  ASSESSMENT:   54 years old female with past medical history of colon cancer status post colectomy, colostomy and parastomal hernia, recurrent large bowel obstruction presented to the emergency room with abdominal pain and poor output from ostomy.  Patient was then admitted to hospital for treatment of bowel obstruction.  11/29: admitted 12/6: s/p ex lap with parastomal hernia repair 12/9: FLD 12/10: Regular -later developed emesis 12/11: CLD 12/12: FLD  Patient tolerated clears. Now on full liquids. Consumed 35% of her full liquid tray this morning. Ensure Surgery has been ordered but pt not consuming yet.  Pt surgery note, pt may still have ileus.  I/Os: +383 ml since admit UOP: 625 ml x 24 hrs JP drain: 25 ml  Colostomy: 110 ml  Admission weight: 270 lbs. Weight on 12/6: 275 lbs  Medications: Miralax, IV Zofran, Miralax  Labs reviewed: CBGs: 85-93  Diet Order:   Diet Order            Diet full liquid Room service appropriate? Yes; Fluid consistency: Thin  Diet effective now                 EDUCATION NEEDS:   No education needs have been identified at this time  Skin:  Skin Assessment: Reviewed RN Assessment  Last BM:  12/13 -colostomy  Height:   Ht Readings from Last 1 Encounters:  01/31/20 5\' 3"  (1.6 m)    Weight:   Wt Readings from Last 1 Encounters:  01/31/20 124.7 kg   BMI:  Body mass index is 48.71 kg/m.  Estimated Nutritional Needs:   Kcal:  1800-2000  Protein:  80-100g  Fluid:  2L/day  Clayton Bibles, MS,  RD, LDN Inpatient Clinical Dietitian Contact information available via Amion

## 2020-02-07 NOTE — Progress Notes (Signed)
    7 Days Post-Op  Subjective: Patient doing well today, sitting up in her chair.  Tolerating her diet.  Ostomy is working well.  Pain is well controlled.  ROS: See above, otherwise other systems negative  Objective: Vital signs in last 24 hours: Temp:  [97.9 F (36.6 C)-98.8 F (37.1 C)] 98.8 F (37.1 C) (12/13 0556) Pulse Rate:  [83-89] 83 (12/13 0556) Resp:  [15-16] 16 (12/13 0556) BP: (125-132)/(73-83) 132/83 (12/13 0556) SpO2:  [96 %-98 %] 98 % (12/13 0556) Last BM Date: 02/07/20  Intake/Output from previous day: 12/12 0701 - 12/13 0700 In: 77 [P.O.:840] Out: 760 [Urine:625; Drains:25; Stool:110] Intake/Output this shift: Total I/O In: 120 [P.O.:120] Out: 0   PE: Abd: soft, appropriately tender, midline incision is c/d/i healing well.  Drain with minimal serous output.  Will DC today.  Colostomy working well with good output.  Stoma is pink and mildly prolapsed  Lab Results:  Recent Labs    02/06/20 0805 02/07/20 0449  WBC 8.2 9.5  HGB 9.4* 10.6*  HCT 29.8* 33.9*  PLT 301 378   BMET Recent Labs    02/06/20 0805 02/07/20 0449  NA 136 138  K 3.7 4.5  CL 100 101  CO2 25 25  GLUCOSE 102* 95  BUN <5* <5*  CREATININE 0.57 0.61  CALCIUM 8.1* 8.8*   PT/INR No results for input(s): LABPROT, INR in the last 72 hours. CMP     Component Value Date/Time   NA 138 02/07/2020 0449   K 4.5 02/07/2020 0449   CL 101 02/07/2020 0449   CO2 25 02/07/2020 0449   GLUCOSE 95 02/07/2020 0449   BUN <5 (L) 02/07/2020 0449   CREATININE 0.61 02/07/2020 0449   CALCIUM 8.8 (L) 02/07/2020 0449   PROT 7.0 02/07/2020 0449   ALBUMIN 3.2 (L) 02/07/2020 0449   AST 52 (H) 02/07/2020 0449   ALT 50 (H) 02/07/2020 0449   ALKPHOS 80 02/07/2020 0449   BILITOT 0.4 02/07/2020 0449   GFRNONAA >60 02/07/2020 0449   GFRAA >60 10/07/2015 0650   Lipase     Component Value Date/Time   LIPASE 19 01/24/2020 1810       Studies/Results: No results  found.  Anti-infectives: Anti-infectives (From admission, onward)   Start     Dose/Rate Route Frequency Ordered Stop   01/31/20 1045  ceFAZolin (ANCEF) IVPB 2g/100 mL premix        2 g 200 mL/hr over 30 Minutes Intravenous  Once 01/31/20 0921 01/31/20 1236       Assessment/Plan POD 7, s/p repair parastomal and incisional hernia -adv to soft diet.  Tolerating fulls well - continue to mobilize -remove JP today -stable for DC home from our standpoint when dispo determined -surgical follow up being arranged  PCOS, HTN, Asthma- home meds HGD:JMEQ diet ID: None VTE: Lovenox   LOS: 14 days    Henreitta Cea , [facility] 02/07/2020, 10:21 AM Please see Amion for pager number during day hours 7:00am-4:30pm or 7:00am -11:30am on weekends

## 2020-02-07 NOTE — Progress Notes (Signed)
Chaplain engaged in initial visit with Cindy Peterson.  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  Her niece, who is also in education and within the same school, has been teaching her class. Cindy Peterson also revealed that she has had a minster be able to come see her regularly while in the hospital and has been able to attend virtual church.  She has been able to remain emotionally and mentally well throughout her stay because of her relationship with God and having the support that she does.  Chaplain offered support as well.  Chaplain will follow-up as needed.    02/07/20 1100  Clinical Encounter Type  Visited With Patient  Visit Type Initial

## 2020-02-07 NOTE — Progress Notes (Signed)
PROGRESS NOTE    Cindy Peterson  JIR:[id number] DOB: [birth year] DOA: 01/24/2020 PCP: Karlene Einstein, MD     Brief Narrative:  54 years BF PMHx Colon cancer s/p colectomy, colostomy and parastomal hernia, recurrent large bowel obstruction, DM type II controlled, essential HTN, PCOS  Presented to the emergency room with abdominal pain and poor output from ostomy.  Patient was then admitted to hospital for treatment of bowel obstruction.  Patient was initially conservatively treated but her symptoms did not improve so patient underwent surgical intervention on 01/31/2020.    Subjective: 12/13 afebrile overnight A/O x4, sitting in the chair comfortably.  States she had some nausea postprandially, negative vomiting, negative pain   Assessment & Plan: Covid vaccination; vaccinated   Principal Problem:   Small bowel obstruction ([place]) Active Problems:   Parastomal hernia   Essential hypertension   Acid reflux   Morbid obesity with BMI of 45.0-49.9, adult (HCC)   PCOS (polycystic ovarian syndrome)   Primary insomnia   Colostomy in place [facility])   Constipation, chronic   Partial small bowel obstruction/parastomal hernia/ileus with constipation: -Patient was treated conservatively initially but did not improve and Failed NG tube clamping trial with significant NG output and abdominal discomfort.  Patient subsequently underwent.exploratory laparotomy with parastomal hernia repair with mesh on 01/31/2020.  Postoperative management as per general surgery -12/12 surgery still concerned patient has ileus see their note.  Essential hypertension: . -Hydralazine 10 mgPRN  DM type II controlled  -On Metformin at home.   -Sensitive SSI     Hypokalemia -Potassium goal> 4  Hypophosphatemia -Phosphorus goal> 2.5  Malnutrition -12/13 surgery advance diet to soft diet.  -Patient understands she must be able to tolerate eating consistently before she will be discharged.   Goals of  care -1.  Must be able to tolerate solid food prior to discharge -2.  Must have a solid/safe plan for discharge.  Patient states her plan is to stay with her sister or aunt, which would be a safe discharge plan.  LCSW Elmyra Ricks is planning to work with patient on discharge plan over the weekend.   DVT prophylaxis: Lovenox Code Status: Full Family Communication: 12/13 Sister at bedside for discussion of plan of care answered all questions Status is: Inpatient    Dispo: The patient is from: Home              Anticipated d/c is to: Home              Anticipated d/c date is: Per surgery              Patient currently unstable      Consultants:  General surgery  Procedures/Significant Events:    I have personally reviewed and interpreted all radiology studies and my findings are as above.  VENTILATOR SETTINGS:    Cultures   Antimicrobials: Anti-infectives (From admission, onward)   Start     Ordered Stop   01/31/20 1045  ceFAZolin (ANCEF) IVPB 2g/100 mL premix        01/31/20 0921 01/31/20 1236       Devices    LINES / TUBES:      Continuous Infusions:    Objective: Vitals:   02/06/20 0606 02/06/20 1350 02/06/20 2154 02/07/20 0556  BP: 140/64 125/81 129/73 132/83  Pulse: 94 84 89 83  Resp: 18 15 16 16   Temp: 98.6 F (37 C) 98.8 F (37.1 C) 97.9 F (36.6 C) 98.8 F (37.1 C)  TempSrc: Oral Oral  Oral Oral  SpO2: 99% 96% 98% 98%  Weight:      Height:        Intake/Output Summary (Last 24 hours) at 02/07/2020 1339 Last data filed at 02/07/2020 1045 Gross per 24 hour  Intake 880 ml  Output 135 ml  Net 745 ml   Filed Weights   01/24/20 1655 01/31/20 1048  Weight: 122.5 kg 124.7 kg    Examination:  General: A/O x4, No acute respiratory distress Eyes: negative scleral hemorrhage, negative anisocoria, negative icterus ENT: Negative Runny nose, negative gingival bleeding, Neck:  Negative scars, masses, torticollis, lymphadenopathy, JVD Lungs:  Clear to auscultation bilaterally without wheezes or crackles Cardiovascular: Regular rate and rhythm without murmur gallop or rub normal S1 and S2 Abdomen: Positive appropriate tenderness abdominal pain, positive distended, negative soft, bowel sounds, no rebound, no ascites, no appreciable mass, ostomy in place pink, negative sign of overt bleed.   Extremities: No significant cyanosis, clubbing, or edema bilateral lower extremities Skin: Negative rashes, lesions, ulcers Psychiatric:  Negative depression, negative anxiety, negative fatigue, negative mania  Central nervous system:  Cranial nerves II through XII intact, tongue/uvula midline, all extremities muscle strength 5/5, sensation intact throughout, negative dysarthria, negative expressive aphasia, negative receptive aphasia.  .     Data Reviewed: Care during the described time interval was provided by me .  I have reviewed this patient's available data, including medical history, events of note, physical examination, and all test results as part of my evaluation.  CBC: Recent Labs  Lab 02/03/20 0454 02/04/20 0539 02/05/20 0552 02/06/20 0805 02/07/20 0449  WBC 11.0* 8.5 8.4 8.2 9.5  NEUTROABS 8.2* 5.8 5.2 5.2 6.0  HGB 10.4* 11.2* 9.8* 9.4* 10.6*  HCT 33.2* 35.9* 31.4* 29.8* 33.9*  MCV 84.7 86.1 85.6 84.9 86.5  PLT 287 311 309 301 035   Basic Metabolic Panel: Recent Labs  Lab 02/02/20 0547 02/03/20 0454 02/04/20 0539 02/05/20 0552 02/06/20 0805 02/07/20 0449  NA 132* 134* 135 134* 136 138  K 3.7 3.8  3.4* 3.9 3.7 3.7 4.5  CL 95* 98 100 99 100 101  CO2 22 24 25 24 25 25   GLUCOSE 82 146* 111* 104* 102* 95  BUN 7 5* 7 6 <5* <5*  CREATININE 0.66 0.53 0.51 0.56 0.57 0.61  CALCIUM 7.9* 7.8* 8.1* 7.9* 8.1* 8.8*  MG 2.3 1.9  2.0 2.2 2.2 2.1 2.3  PHOS 2.5  2.4*  --  2.1* 2.0* 3.3 3.5   GFR: Estimated Creatinine Clearance: 105.6 mL/min (by C-G formula based on SCr of 0.61 mg/dL). Liver Function Tests: Recent Labs   Lab 02/03/20 0454 02/04/20 0539 02/05/20 0552 02/06/20 0805 02/07/20 0449  AST 15 24 22  36 52*  ALT 26 30 29  38 50*  ALKPHOS 61 81 74 71 80  BILITOT 1.1 1.0 0.9 0.4 0.4  PROT 6.0* 6.7 6.4* 6.3* 7.0  ALBUMIN 2.8* 3.1* 2.9* 2.9* 3.2*   No results for input(s): LIPASE, AMYLASE in the last 168 hours. No results for input(s): AMMONIA in the last 168 hours. Coagulation Profile: No results for input(s): INR, PROTIME in the last 168 hours. Cardiac Enzymes: No results for input(s): CKTOTAL, CKMB, CKMBINDEX, TROPONINI in the last 168 hours. BNP (last 3 results) No results for input(s): PROBNP in the last 8760 hours. HbA1C: No results for input(s): HGBA1C in the last 72 hours. CBG: Recent Labs  Lab 02/06/20 1152 02/06/20 1716 02/07/20 0004 02/07/20 0559 02/07/20 1137  GLUCAP 96 80 93 85 94  Lipid Profile: No results for input(s): CHOL, HDL, LDLCALC, TRIG, CHOLHDL, LDLDIRECT in the last 72 hours. Thyroid Function Tests: No results for input(s): TSH, T4TOTAL, FREET4, T3FREE, THYROIDAB in the last 72 hours. Anemia Panel: No results for input(s): VITAMINB12, FOLATE, FERRITIN, TIBC, IRON, RETICCTPCT in the last 72 hours. Sepsis Labs: No results for input(s): PROCALCITON, LATICACIDVEN in the last 168 hours.  Recent Results (from the past 240 hour(s))  Surgical pcr screen     Status: None   Collection Time: 01/31/20 10:28 AM   Specimen: Nasal Mucosa; Nasal Swab  Result Value Ref Range Status   MRSA, PCR NEGATIVE NEGATIVE Final   Staphylococcus aureus NEGATIVE NEGATIVE Final    Comment: (NOTE) The Xpert SA Assay (FDA approved for NASAL specimens in patients 71 years of age and older), is one component of a comprehensive surveillance program. It is not intended to diagnose infection nor to guide or monitor treatment. Performed at [facility], [place] [address] [place], [place] [address]          Radiology Studies: No results found.      Scheduled  Meds: . acetaminophen  650 mg Oral Q6H  . bisacodyl  10 mg Rectal Once  . enoxaparin (LOVENOX) injection  40 mg Subcutaneous Q24H  . feeding supplement  237 mL Oral BID BM  . gabapentin  300 mg Oral TID  . insulin aspart  0-9 Units Subcutaneous Q6H  . methocarbamol  1,000 mg Oral TID  . polyethylene glycol  17 g Oral Daily   Continuous Infusions:    LOS: 14 days    Time spent:40 min    Tenee Wish, Geraldo Docker, MD [facility] Pager [phone]  If 7PM-7AM, please contact night-coverage [email or web address] Password [email or web address] 02/07/2020, 1:39 PM

## 2020-02-08 DIAGNOSIS — R112 Nausea with vomiting, unspecified: Secondary | ICD-10-CM

## 2020-02-08 DIAGNOSIS — K5909 Other constipation: Secondary | ICD-10-CM

## 2020-02-08 LAB — COMPREHENSIVE METABOLIC PANEL
ALT: 45 U/L — ABNORMAL HIGH (ref 0–44)
AST: 43 U/L — ABNORMAL HIGH (ref 15–41)
Albumin: 3 g/dL — ABNORMAL LOW (ref 3.5–5.0)
Alkaline Phosphatase: 75 U/L (ref 38–126)
Anion gap: 11 (ref 5–15)
BUN: <5 mg/dL — ABNORMAL LOW (ref 6–20)
CO2: 25 mmol/L (ref 22–32)
Calcium: 8.2 mg/dL — ABNORMAL LOW (ref 8.9–10.3)
Chloride: 104 mmol/L (ref 98–111)
Creatinine, Ser: 0.57 mg/dL (ref 0.44–1.00)
GFR, Estimated: >60 mL/min (ref >60)
Glucose, Bld: 104 mg/dL — ABNORMAL HIGH (ref 70–99)
Potassium: 3.8 mmol/L (ref 3.5–5.1)
Sodium: 140 mmol/L (ref 135–145)
Total Bilirubin: 0.4 mg/dL (ref 0.3–1.2)
Total Protein: 6.2 g/dL — ABNORMAL LOW (ref 6.5–8.1)

## 2020-02-08 LAB — CBC WITH DIFFERENTIAL/PLATELET
Abs Immature Granulocytes: 0.27 10*3/uL — ABNORMAL HIGH (ref 0.00–0.07)
Basophils Absolute: 0 10*3/uL (ref 0.0–0.1)
Basophils Relative: 0 %
Eosinophils Absolute: 0.3 10*3/uL (ref 0.0–0.5)
Eosinophils Relative: 4 %
HCT: 30.6 % — ABNORMAL LOW (ref 36.0–46.0)
Hemoglobin: 9.6 g/dL — ABNORMAL LOW (ref 12.0–15.0)
Immature Granulocytes: 3 %
Lymphocytes Relative: 19 %
Lymphs Abs: 1.7 10*3/uL (ref 0.7–4.0)
MCH: 26.9 pg (ref 26.0–34.0)
MCHC: 31.4 g/dL (ref 30.0–36.0)
MCV: 85.7 fL (ref 80.0–100.0)
Monocytes Absolute: 0.9 10*3/uL (ref 0.1–1.0)
Monocytes Relative: 10 %
Neutro Abs: 5.7 10*3/uL (ref 1.7–7.7)
Neutrophils Relative %: 64 %
Platelets: 333 10*3/uL (ref 150–400)
RBC: 3.57 MIL/uL — ABNORMAL LOW (ref 3.87–5.11)
RDW: 16.2 % — ABNORMAL HIGH (ref 11.5–15.5)
WBC: 8.8 10*3/uL (ref 4.0–10.5)
nRBC: 0 % (ref 0.0–0.2)

## 2020-02-08 LAB — MAGNESIUM: Magnesium: 2.4 mg/dL (ref 1.7–2.4)

## 2020-02-08 LAB — GLUCOSE, CAPILLARY
Glucose-Capillary: 87 mg/dL (ref 70–99)
Glucose-Capillary: 98 mg/dL (ref 70–99)

## 2020-02-08 LAB — PHOSPHORUS: Phosphorus: 3.7 mg/dL (ref 2.5–4.6)

## 2020-02-08 MED ORDER — ONDANSETRON HCL 4 MG PO TABS: 4.0000 mg | ORAL_TABLET | Freq: Four times a day (QID) | ORAL | 0 refills | Status: AC | PRN

## 2020-02-08 MED ORDER — GUAIFENESIN-DM 100-10 MG/5ML PO SYRP: 5.0000 mL | ORAL_SOLUTION | ORAL | 0 refills | Status: AC | PRN

## 2020-02-08 MED ORDER — POLYETHYLENE GLYCOL 3350 17 G PO PACK: 17.0000 g | PACK | Freq: Every day | ORAL | 0 refills | Status: AC

## 2020-02-08 NOTE — Progress Notes (Signed)
    8 Days Post-Op  Subjective: Patient doing well today, sitting up in her chair.  Tolerating her diet.  Ostomy is working well.  Pain is well controlled.  No issues since her JP drain removed yesterday.  ROS: See above, otherwise other systems negative  Objective: Vital signs in last 24 hours: Temp:  [98.5 F (36.9 C)-99.3 F (37.4 C)] 98.5 F (36.9 C) (12/14 0532) Pulse Rate:  [95-97] 96 (12/14 0532) Resp:  [18] 18 (12/14 0532) BP: (109-129)/(81-88) 109/81 (12/14 0532) SpO2:  [98 %-100 %] 100 % (12/14 0532) Last BM Date: 02/08/20  Intake/Output from previous day: 12/13 0701 - 12/14 0700 In: 640 [P.O.:640] Out: 700 [Stool:700] Intake/Output this shift: Total I/O In: 240 [P.O.:240] Out: 0   PE: Abd: soft, appropriately tender, midline incision is c/d/i healing well. Colostomy working well with good output.  Stoma is pink and mildly prolapsed  Lab Results:  Recent Labs    02/07/20 0449 02/08/20 0459  WBC 9.5 8.8  HGB 10.6* 9.6*  HCT 33.9* 30.6*  PLT 378 333   BMET Recent Labs    02/07/20 0449 02/08/20 0459  NA 138 140  K 4.5 3.8  CL 101 104  CO2 25 25  GLUCOSE 95 104*  BUN <5* <5*  CREATININE 0.61 0.57  CALCIUM 8.8* 8.2*   PT/INR No results for input(s): LABPROT, INR in the last 72 hours. CMP     Component Value Date/Time   NA 140 02/08/2020 0459   K 3.8 02/08/2020 0459   CL 104 02/08/2020 0459   CO2 25 02/08/2020 0459   GLUCOSE 104 (H) 02/08/2020 0459   BUN <5 (L) 02/08/2020 0459   CREATININE 0.57 02/08/2020 0459   CALCIUM 8.2 (L) 02/08/2020 0459   PROT 6.2 (L) 02/08/2020 0459   ALBUMIN 3.0 (L) 02/08/2020 0459   AST 43 (H) 02/08/2020 0459   ALT 45 (H) 02/08/2020 0459   ALKPHOS 75 02/08/2020 0459   BILITOT 0.4 02/08/2020 0459   GFRNONAA >60 02/08/2020 0459   GFRAA >60 10/07/2015 0650   Lipase     Component Value Date/Time   LIPASE 19 01/24/2020 1810       Studies/Results: No results found.  Anti-infectives: Anti-infectives  (From admission, onward)   Start     Dose/Rate Route Frequency Ordered Stop   01/31/20 1045  ceFAZolin (ANCEF) IVPB 2g/100 mL premix        2 g 200 mL/hr over 30 Minutes Intravenous  Once 01/31/20 0921 01/31/20 1236       Assessment/Plan POD 8, s/p repair parastomal and incisional hernia -tolerating regular diet -stable for DC home from our standpoint when dispo determined -surgical follow has been arranged  PCOS, HTN, Asthma- home meds  JXB:JYNW diet ID: None VTE: Lovenox   LOS: 15 days    Henreitta Cea , [facility] Surgery 02/08/2020, 11:31 AM Please see Amion for pager number during day hours 7:00am-4:30pm or 7:00am -11:30am on weekends Patient ID: Cindy Peterson, female   DOB: 1965/04/19, 54 y.o.   MRN: [id number]

## 2020-02-08 NOTE — Plan of Care (Signed)
Instructions were reviewed with patient. All questions were answered. Patient was transported to main entrance by wheelchair. ° °

## 2020-02-08 NOTE — TOC Transition Note (Addendum)
Transition of Care [facility]) - CM/SW Discharge Note   Patient Details  Name: Cindy Peterson MRN: [id number] Date of Birth: 10-29-1965  Transition of [facility]) CM/SW Contact:  Lia Hopping, [place] Phone Number: 02/08/2020, 12:16 PM   Clinical Narrative:  Re: D/C Plan.   CSW met with the patient at bedside to discharge needs. Patient  plans to discharge to her sister's home.She has a few steps to get into the home but will be on one level during her stay. She declines needing DME. She feels safe enough to ambulate without a walker. Patient can manage her own colostomy, and declines needing assistance. Patient understands she will need to make follow up appointments asap, information on AVS. Patient excited about discharging today.  No needs identified.  Final next level of care: Home  Barriers to Discharge: Barriers Resolved   Patient Goals and CMS Choice Patient states their goals for this hospitalization and ongoing recovery are:: return to her sister's home      Discharge Placement                       Discharge Plan and Services                                     Social Determinants of Health (SDOH) Interventions     Readmission Risk Interventions No flowsheet data found.

## 2020-02-08 NOTE — Discharge Summary (Signed)
Physician Discharge Summary  Cindy Peterson AQT:[id number] DOB: 1965/07/24 DOA: 01/24/2020  PCP: Karlene Einstein, MD  Admit date: 01/24/2020 Discharge date: 02/08/2020  Time spent: 35 minutes  Recommendations for Outpatient Follow-up:   Covid vaccination; vaccinated  Partial small bowel obstruction/parastomal hernia/ileus with constipation: -Patient was treated conservatively initially but did not improve andFailed NG tube clamping trialwith significant NG output and abdominal discomfort. Patient subsequently underwent.exploratory laparotomy with parastomal hernia repair with meshon 01/31/2020.Postoperative management as per general surgery -12/12 surgery still concerned patient has ileus see their note. -02/28/2018 to follow-up @1430  with Dr. Gerrit Halls surgery SBO/ileus  Essential hypertension: . -Doing well without BP medication. -Schedule follow-up appointment with Dr. Charlcie Cradle in 1 to 2 weeks essential HTN, DM type II controlled.  He will decide when/if patient needs to restart HTN medication.  DM type II controlled  11/30 hemoglobin A1c= 6.2 -Restart home Metformin    Hypokalemia -Potassium goal> 4  Hypophosphatemia -Phosphorus goal> 2.5  Malnutrition -12/13 surgery advance diet to soft diet.  -Patient tolerating her meals, surgery has okayed patient for discharge.     Discharge Diagnoses:  Principal Problem:   Small bowel obstruction ([place]) Active Problems:   Parastomal hernia   Essential hypertension   Acid reflux   Morbid obesity with BMI of 45.0-49.9, adult (HCC)   PCOS (polycystic ovarian syndrome)   Primary insomnia   Colostomy in place [facility])   Constipation, chronic   Discharge Condition: Stable  Diet recommendation: Soft diet  Filed Weights   01/24/20 1655 01/31/20 1048  Weight: 122.5 kg 124.7 kg    History of present illness:  54 years BF PMHx Colon cancer s/p colectomy, colostomy and parastomal hernia,  recurrent large bowel obstruction, DM type II controlled, essential HTN, PCOS  Presented to the emergency room with abdominal pain and poor output from ostomy. Patient was then admitted to hospital for treatment of bowel obstruction. Patient was initially conservatively treated but her symptoms did not improve so patient underwent surgical intervention on 01/31/2020.  Hospital Course:  See above  Procedures: 12/6 Open retrorectus [place] mesh repair of parastomal hernia Midline retrorectus ventral incisional hernia repair -Bilateral posterior rectus sheath release and advancement Left sided transversus abdominis myofascial release and advancement -Lysis of adhesions (2 hours) Intra-operative TAP block   Consultations: General surgery     Discharge Exam: Vitals:   02/07/20 0556 02/07/20 1359 02/07/20 2042 02/08/20 0532  BP: 132/83 128/82 129/88 109/81  Pulse: 83 97 95 96  Resp: 16 18 18 18   Temp: 98.8 F (37.1 C) 99.3 F (37.4 C) 98.6 F (37 C) 98.5 F (36.9 C)  TempSrc: Oral Oral Oral Oral  SpO2: 98% 98% 100% 100%  Weight:      Height:        General: A/O x4, No acute respiratory distress Eyes: negative scleral hemorrhage, negative anisocoria, negative icterus ENT: Negative Runny nose, negative gingival bleeding, Neck:  Negative scars, masses, torticollis, lymphadenopathy, JVD Lungs: Clear to auscultation bilaterally without wheezes or crackles Cardiovascular: Regular rate and rhythm without murmur gallop or rub normal S1 and S2   Discharge Instructions   Allergies as of 02/08/2020      Reactions   Morphine And Related Nausea And Vomiting   Latex Rash   itching   Tape Rash      Medication List    STOP taking these medications   candesartan-hydrochlorothiazide 32-12.5 MG tablet Commonly known as: ATACAND HCT     TAKE these medications  acetaminophen 325 MG tablet Commonly known as: TYLENOL Take 2 tablets (650 mg total) by mouth every 6 (six) hours  as needed for mild pain or fever.   albuterol 108 (90 Base) MCG/ACT inhaler Commonly known as: VENTOLIN HFA INHALE 2 PUFFS INTO THE LUNGS EVERY 4 HOURS AS NEEDED FOR WHEEZING OR SHORTNESS OF BREATH OR COUGH   Flovent Diskus 250 MCG/BLIST Aepb Generic drug: Fluticasone Propionate (Inhal) INHALE 1 PUFF INTO THE LUNGS TWICE DAILY   gabapentin 300 MG capsule Commonly known as: NEURONTIN Take 1 capsule (300 mg total) by mouth 3 (three) times daily.   guaiFENesin-dextromethorphan 100-10 MG/5ML syrup Commonly known as: ROBITUSSIN DM Take 5 mLs by mouth every 4 (four) hours as needed for cough.   metFORMIN 500 MG tablet Commonly known as: GLUCOPHAGE Take by mouth 2 (two) times daily with a meal.   methocarbamol 500 MG tablet Commonly known as: ROBAXIN Take 2 tablets (1,000 mg total) by mouth 3 (three) times daily.   montelukast 10 MG tablet Commonly known as: SINGULAIR TAKE 1 TABLET(10 MG) BY MOUTH EVERY NIGHT   ondansetron 4 MG tablet Commonly known as: ZOFRAN Take 1 tablet (4 mg total) by mouth every 6 (six) hours as needed for nausea.   oxyCODONE 5 MG immediate release tablet Commonly known as: Oxy IR/ROXICODONE Take 1-2 tablets (5-10 mg total) by mouth every 6 (six) hours as needed for moderate pain or severe pain.   polyethylene glycol 17 g packet Commonly known as: MIRALAX / GLYCOLAX Take 17 g by mouth daily. Start taking on: February 09, 2020   zolpidem 5 MG tablet Commonly known as: AMBIEN Take 5 mg by mouth at bedtime as needed for sleep.      Allergies  Allergen Reactions  . Morphine And Related Nausea And Vomiting  . Latex Rash    itching  . Tape Rash    Follow-up Information    Stechschulte, Nickola Major, MD. Go on 02/29/2020.   Specialty: Surgery Why: 2:30 PM. Please arrive 30 min prior to appointment time. Bring photo ID and insurance information.  Contact information: [place]. [address] [place] [address] [phone]        Karlene Einstein, MD. Schedule an appointment as soon as possible for a visit in 2 week(s).   Specialty: Family Medicine Why: Schedule follow-up appointment with Dr. Charlcie Cradle in 1 to 2 weeks essential HTN, DM type II controlled.  He will decide when/if patient needs to restart HTN medication. Contact information: [place] [address] [phone]                The results of significant diagnostics from this hospitalization (including imaging, microbiology, ancillary and laboratory) are listed below for reference.    Significant Diagnostic Studies: DG Abd 1 View  Result Date: 02/05/2020 CLINICAL DATA:  Nausea and vomiting. EXAM: ABDOMEN - 1 VIEW COMPARISON:  01/31/2020 and prior FINDINGS: Gas is seen within nondilated bowel loops. Moderate colorectal stool burden. No evidence of pneumoperitoneum. Right upper quadrant surgical clips. Multilevel spondylosis. IMPRESSION: Nonobstructive bowel gas pattern.  Moderate stool burden. Electronically Signed   By: Primitivo Gauze M.D.   On: 02/05/2020 07:57   DG Abd 1 View  Result Date: 01/31/2020 CLINICAL DATA:  Nasogastric tube placement. EXAM: ABDOMEN - 1 VIEW COMPARISON:  January 31, 2020 (5:18 p.m.) FINDINGS: A nasogastric tube is seen with its distal tip overlying the body of the stomach. This is stable in position when compared  to the prior study. The bowel gas pattern is normal. No radio-opaque calculi or other significant radiographic abnormality are seen. Radiopaque surgical clips are seen overlying the right upper quadrant. IMPRESSION: Nasogastric tube positioning, as described above. Electronically Signed   By: Virgina Norfolk M.D.   On: 01/31/2020 20:40   X-ray abdomen AP  Result Date: 01/31/2020 CLINICAL DATA:  Status post nasogastric tube placement. EXAM: ABDOMEN - 1 VIEW COMPARISON:  January 30, 2020. FINDINGS: The bowel gas pattern is normal. Distal tip of nasogastric tube is seen in proximal small bowel. No  radio-opaque calculi or other significant radiographic abnormality are seen. IMPRESSION: Distal tip of nasogastric tube seen in proximal small bowel. Electronically Signed   By: Marijo Conception M.D.   On: 01/31/2020 17:55   DG Abd 1 View  Result Date: 01/25/2020 CLINICAL DATA:  NG placement assessment EXAM: ABDOMEN - 1 VIEW COMPARISON:  Radiograph 01/25/2020 FINDINGS: Radiograph encompasses much of the chest and portion of the upper abdomen. This is not a complete assessment of the abdomen itself. A transesophageal tube is seen coiling in the left upper quadrant with tip and side port distal to the GE junction. Small amount of high attenuation material is present in the gastric lumen as well, likely retained contrast media. Small-bowel dilatation seen on comparison abdominal radiographs is less well assessed given incomplete visualization of the abdomen. Atelectatic changes are present in the lungs. The cardiomediastinal contours are unremarkable. IMPRESSION: 1. A transesophageal tube is seen coiling in the left upper quadrant with tip and side port distal to the GE junction. 2. Small amount of high attenuation material in the gastric lumen, likely retained contrast media. Electronically Signed   By: Lovena Le M.D.   On: 01/25/2020 23:05   DG Abdomen 1 View  Result Date: 01/24/2020 CLINICAL DATA:  Status post gastric catheter placed EXAM: ABDOMEN - 1 VIEW COMPARISON:  None. FINDINGS: Gastric catheter is noted within the stomach. Relative paucity of bowel gas is noted within the abdomen. No free air is seen. IMPRESSION: Gastric catheter within the stomach. Electronically Signed   By: Inez Catalina M.D.   On: 01/24/2020 20:15   CT ABDOMEN PELVIS W CONTRAST  Result Date: 01/24/2020 CLINICAL DATA:  Abdominal pain, nausea and vomiting, history of colon cancer EXAM: CT ABDOMEN AND PELVIS WITH CONTRAST TECHNIQUE: Multidetector CT imaging of the abdomen and pelvis was performed using the standard protocol  following bolus administration of intravenous contrast. CONTRAST:  162mL OMNIPAQUE IOHEXOL 300 MG/ML  SOLN COMPARISON:  03/20/2019 FINDINGS: Lower chest: No acute pleural or parenchymal lung disease. Hepatobiliary: No focal liver abnormality is seen. Status post cholecystectomy. No biliary dilatation. Pancreas: Unremarkable. No pancreatic ductal dilatation or surrounding inflammatory changes. Spleen: Normal in size without focal abnormality. Adrenals/Urinary Tract: Adrenal glands are unremarkable. Kidneys are normal, without renal calculi, focal lesion, or hydronephrosis. Bladder is unremarkable. Stomach/Bowel: There is a left lower quadrant colostomy. There is a large parastomal hernia, containing multiple loops of small bowel. The small bowel is dilated measuring up to 5 cm in diameter, consistent with obstruction. There is also an infraumbilical midline ventral hernia containing nondilated loops of small bowel. No bowel wall thickening. Vascular/Lymphatic: No significant vascular findings are present. No enlarged abdominal or pelvic lymph nodes. Reproductive: Status post hysterectomy. No adnexal masses. Other: No free fluid or free gas. Musculoskeletal: No acute or destructive bony lesions. Reconstructed images demonstrate no additional findings. IMPRESSION: 1. Large left lower quadrant parastomal hernia, with small-bowel obstruction involving the  herniated small bowel. 2. Midline infraumbilical ventral hernia without evidence of complication. Electronically Signed   By: Randa Ngo M.D.   On: 01/24/2020 19:18   DG Abd 2 Views  Result Date: 01/30/2020 CLINICAL DATA:  Constipation.  Small bowel obstruction. EXAM: ABDOMEN - 2 VIEW COMPARISON:  January 27, 2020 FINDINGS: A mildly dilated loop of small bowel seen in the left lateral abdomen with apparent associated wall thickening. An air-fluid level is identified in these loops of bowel on the upright imaging. There is contrast in the ascending colon and air  in the descending colon. An NG tube terminates in the region of the distal stomach or proximal duodenum. No other acute abnormalities. No free air, portal venous gas, or pneumatosis. IMPRESSION: Continued small bowel obstruction. A dilated loop of small bowel left abdomen appears to demonstrate some wall thickening. The NG tube distal tip is either in the distal stomach or proximal duodenum. Electronically Signed   By: Dorise Bullion III M.D   On: 01/30/2020 13:25   DG Abd Portable 1V  Result Date: 01/27/2020 CLINICAL DATA:  Check gastric catheter placement EXAM: PORTABLE ABDOMEN - 1 VIEW COMPARISON:  None. FINDINGS: Gastric catheter is noted within the stomach. Free air is noted. Scattered large and small bowel gas is noted. IMPRESSION: Gastric catheter within the stomach. Electronically Signed   By: Inez Catalina M.D.   On: 01/27/2020 23:24   DG Abd Portable 1V  Result Date: 01/27/2020 CLINICAL DATA:  Small-bowel obstruction. EXAM: PORTABLE ABDOMEN - 1 VIEW COMPARISON:  01/25/2020.  CT 01/24/2020. FINDINGS: NG tube noted stomach. Surgical clips right upper quadrant. Persistent dilated loop of small bowel noted. Stool noted in the colon. No free air identified. Degenerative change lumbar spine. IMPRESSION: NG tube noted the stomach. Persistent dilated loop of small bowel noted. Stool noted in the colon. Reference is made to CT report 01/24/2020. Electronically Signed   By: Marcello Moores  Register   On: 01/27/2020 06:19   DG Abd Portable 1V-Small Bowel Obstruction Protocol-initial, 8 hr delay  Result Date: 01/25/2020 CLINICAL DATA:  8 hour follow-up exam for small-bowel obstruction. EXAM: PORTABLE ABDOMEN - 1 VIEW COMPARISON:  Film from earlier in the same day. FINDINGS: Gastric catheter is noted within the stomach. A small amount of contrast is noted with stomach as well. Persistent small bowel dilatation is seen. The large peristomal hernia is again noted on the left. No definitive contrast is noted within  the more distal small bowel. Continued follow-up is recommended. IMPRESSION: Minimal contrast within the stomach. No definitive contrast is noted within the colon. Continued follow-up is recommended. Small bowel dilatation remains. Electronically Signed   By: Inez Catalina M.D.   On: 01/25/2020 20:31   DG Abd Portable 1V-Small Bowel Protocol-Position Verification  Result Date: 01/25/2020 CLINICAL DATA:  NG tube placement. EXAM: PORTABLE ABDOMEN - 1 VIEW COMPARISON:  01/24/2020. FINDINGS: NG tube noted with tip over the stomach. Surgical clips right upper quadrant. No bowel distention. Stool noted throughout the colon. No free air. Mild bibasilar atelectasis. Degenerative change thoracolumbar spine. IMPRESSION: NG tube noted with tip over the stomach. Electronically Signed   By: Marcello Moores  Register   On: 01/25/2020 11:41    Microbiology: Recent Results (from the past 240 hour(s))  Surgical pcr screen     Status: None   Collection Time: 01/31/20 10:28 AM   Specimen: Nasal Mucosa; Nasal Swab  Result Value Ref Range Status   MRSA, PCR NEGATIVE NEGATIVE Final   Staphylococcus aureus NEGATIVE NEGATIVE  Final    Comment: (NOTE) The Xpert SA Assay (FDA approved for NASAL specimens in patients 40 years of age and older), is one component of a comprehensive surveillance program. It is not intended to diagnose infection nor to guide or monitor treatment. Performed at [facility], [place] [address] [place], [place] [address]      Labs: Basic Metabolic Panel: Recent Labs  Lab 02/04/20 0539 02/05/20 0552 02/06/20 0805 02/07/20 0449 02/08/20 0459  NA 135 134* 136 138 140  K 3.9 3.7 3.7 4.5 3.8  CL 100 99 100 101 104  CO2 25 24 25 25 25   GLUCOSE 111* 104* 102* 95 104*  BUN 7 6 <5* <5* <5*  CREATININE 0.51 0.56 0.57 0.61 0.57  CALCIUM 8.1* 7.9* 8.1* 8.8* 8.2*  MG 2.2 2.2 2.1 2.3 2.4  PHOS 2.1* 2.0* 3.3 3.5 3.7   Liver Function Tests: Recent Labs  Lab 02/04/20 0539  02/05/20 0552 02/06/20 0805 02/07/20 0449 02/08/20 0459  AST 24 22 36 52* 43*  ALT 30 29 38 50* 45*  ALKPHOS 81 74 71 80 75  BILITOT 1.0 0.9 0.4 0.4 0.4  PROT 6.7 6.4* 6.3* 7.0 6.2*  ALBUMIN 3.1* 2.9* 2.9* 3.2* 3.0*   No results for input(s): LIPASE, AMYLASE in the last 168 hours. No results for input(s): AMMONIA in the last 168 hours. CBC: Recent Labs  Lab 02/04/20 0539 02/05/20 0552 02/06/20 0805 02/07/20 0449 02/08/20 0459  WBC 8.5 8.4 8.2 9.5 8.8  NEUTROABS 5.8 5.2 5.2 6.0 5.7  HGB 11.2* 9.8* 9.4* 10.6* 9.6*  HCT 35.9* 31.4* 29.8* 33.9* 30.6*  MCV 86.1 85.6 84.9 86.5 85.7  PLT 311 309 301 378 333   Cardiac Enzymes: No results for input(s): CKTOTAL, CKMB, CKMBINDEX, TROPONINI in the last 168 hours. BNP: BNP (last 3 results) No results for input(s): BNP in the last 8760 hours.  ProBNP (last 3 results) No results for input(s): PROBNP in the last 8760 hours.  CBG: Recent Labs  Lab 02/07/20 1137 02/07/20 1736 02/07/20 2335 02/08/20 0535 02/08/20 1207  GLUCAP 94 85 90 87 98       Signed:  Dia Crawford, MD [facility] [phone] pager

## 2020-02-24 ENCOUNTER — Encounter (HOSPITAL_COMMUNITY)
Admission: EM | Disposition: A | Discharge status: Home / Self Care | Payer: Self-pay | Source: Home / Self Care | Attending: Internal Medicine

## 2020-02-24 ENCOUNTER — Inpatient Hospital Stay (HOSPITAL_COMMUNITY): Payer: BC Managed Care – PPO | Admitting: Registered Nurse

## 2020-02-24 ENCOUNTER — Other Ambulatory Visit: Payer: Self-pay

## 2020-02-24 ENCOUNTER — Inpatient Hospital Stay (HOSPITAL_COMMUNITY)
Admission: EM | Admit: 2020-02-24 | Discharge: 2020-02-27 | DRG: 863 | Disposition: A | Discharge status: Home / Self Care | Payer: BC Managed Care – PPO | Attending: Family Medicine | Admitting: Family Medicine

## 2020-02-24 ENCOUNTER — Encounter (HOSPITAL_COMMUNITY): Payer: Self-pay | Admitting: Emergency Medicine

## 2020-02-24 ENCOUNTER — Emergency Department (HOSPITAL_COMMUNITY): Payer: BC Managed Care – PPO

## 2020-02-24 DIAGNOSIS — Z91048 Other nonmedicinal substance allergy status: Secondary | ICD-10-CM | POA: Diagnosis not present

## 2020-02-24 DIAGNOSIS — D638 Anemia in other chronic diseases classified elsewhere: Secondary | ICD-10-CM | POA: Diagnosis not present

## 2020-02-24 DIAGNOSIS — Y833 Surgical operation with formation of external stoma as the cause of abnormal reaction of the patient, or of later complication, without mention of misadventure at the time of the procedure: Secondary | ICD-10-CM | POA: Diagnosis present

## 2020-02-24 DIAGNOSIS — R9431 Abnormal electrocardiogram [ECG] [EKG]: Secondary | ICD-10-CM | POA: Diagnosis present

## 2020-02-24 DIAGNOSIS — D62 Acute posthemorrhagic anemia: Secondary | ICD-10-CM | POA: Diagnosis not present

## 2020-02-24 DIAGNOSIS — Z6841 Body Mass Index (BMI) 40.0 and over, adult: Secondary | ICD-10-CM

## 2020-02-24 DIAGNOSIS — K5909 Other constipation: Secondary | ICD-10-CM | POA: Diagnosis present

## 2020-02-24 DIAGNOSIS — E039 Hypothyroidism, unspecified: Secondary | ICD-10-CM | POA: Diagnosis present

## 2020-02-24 DIAGNOSIS — E876 Hypokalemia: Secondary | ICD-10-CM

## 2020-02-24 DIAGNOSIS — T8141XA Infection following a procedure, superficial incisional surgical site, initial encounter: Principal | ICD-10-CM | POA: Diagnosis present

## 2020-02-24 DIAGNOSIS — Z7984 Long term (current) use of oral hypoglycemic drugs: Secondary | ICD-10-CM | POA: Diagnosis not present

## 2020-02-24 DIAGNOSIS — K435 Parastomal hernia without obstruction or  gangrene: Secondary | ICD-10-CM | POA: Diagnosis present

## 2020-02-24 DIAGNOSIS — Z9049 Acquired absence of other specified parts of digestive tract: Secondary | ICD-10-CM | POA: Diagnosis not present

## 2020-02-24 DIAGNOSIS — Z933 Colostomy status: Secondary | ICD-10-CM | POA: Diagnosis not present

## 2020-02-24 DIAGNOSIS — Z79899 Other long term (current) drug therapy: Secondary | ICD-10-CM | POA: Diagnosis not present

## 2020-02-24 DIAGNOSIS — Z20822 Contact with and (suspected) exposure to covid-19: Secondary | ICD-10-CM | POA: Diagnosis present

## 2020-02-24 DIAGNOSIS — Z885 Allergy status to narcotic agent status: Secondary | ICD-10-CM | POA: Diagnosis not present

## 2020-02-24 DIAGNOSIS — D649 Anemia, unspecified: Secondary | ICD-10-CM | POA: Diagnosis not present

## 2020-02-24 DIAGNOSIS — Z91040 Latex allergy status: Secondary | ICD-10-CM

## 2020-02-24 DIAGNOSIS — I10 Essential (primary) hypertension: Secondary | ICD-10-CM | POA: Diagnosis present

## 2020-02-24 DIAGNOSIS — L02211 Cutaneous abscess of abdominal wall: Secondary | ICD-10-CM | POA: Diagnosis present

## 2020-02-24 DIAGNOSIS — B952 Enterococcus as the cause of diseases classified elsewhere: Secondary | ICD-10-CM | POA: Diagnosis not present

## 2020-02-24 DIAGNOSIS — Z85048 Personal history of other malignant neoplasm of rectum, rectosigmoid junction, and anus: Secondary | ICD-10-CM | POA: Diagnosis not present

## 2020-02-24 DIAGNOSIS — D75839 Thrombocytosis, unspecified: Secondary | ICD-10-CM | POA: Diagnosis present

## 2020-02-24 DIAGNOSIS — J45909 Unspecified asthma, uncomplicated: Secondary | ICD-10-CM | POA: Diagnosis present

## 2020-02-24 DIAGNOSIS — T8142XA Infection following a procedure, deep incisional surgical site, initial encounter: Secondary | ICD-10-CM | POA: Diagnosis not present

## 2020-02-24 HISTORY — DX: Hypothyroidism, unspecified: E03.9

## 2020-02-24 HISTORY — PX: IRRIGATION AND DEBRIDEMENT ABSCESS: SHX5252

## 2020-02-24 LAB — GLUCOSE, CAPILLARY
Glucose-Capillary: 106 mg/dL — ABNORMAL HIGH (ref 70–99)
Glucose-Capillary: 138 mg/dL — ABNORMAL HIGH (ref 70–99)

## 2020-02-24 LAB — CBC WITH DIFFERENTIAL/PLATELET
Abs Immature Granulocytes: 0.08 10*3/uL — ABNORMAL HIGH (ref 0.00–0.07)
Basophils Absolute: 0 10*3/uL (ref 0.0–0.1)
Basophils Relative: 0 %
Eosinophils Absolute: 0.1 10*3/uL (ref 0.0–0.5)
Eosinophils Relative: 1 %
HCT: 29.5 % — ABNORMAL LOW (ref 36.0–46.0)
Hemoglobin: 9.3 g/dL — ABNORMAL LOW (ref 12.0–15.0)
Immature Granulocytes: 1 %
Lymphocytes Relative: 19 %
Lymphs Abs: 2.2 10*3/uL (ref 0.7–4.0)
MCH: 26.6 pg (ref 26.0–34.0)
MCHC: 31.5 g/dL (ref 30.0–36.0)
MCV: 84.5 fL (ref 80.0–100.0)
Monocytes Absolute: 1 10*3/uL (ref 0.1–1.0)
Monocytes Relative: 9 %
Neutro Abs: 8.2 10*3/uL — ABNORMAL HIGH (ref 1.7–7.7)
Neutrophils Relative %: 70 %
Platelets: 544 10*3/uL — ABNORMAL HIGH (ref 150–400)
RBC: 3.49 MIL/uL — ABNORMAL LOW (ref 3.87–5.11)
RDW: 15.5 % (ref 11.5–15.5)
WBC: 11.7 10*3/uL — ABNORMAL HIGH (ref 4.0–10.5)
nRBC: 0 % (ref 0.0–0.2)

## 2020-02-24 LAB — RESP PANEL BY RT-PCR (FLU A&B, COVID) ARPGX2
Influenza A by PCR: NEGATIVE
Influenza B by PCR: NEGATIVE
SARS Coronavirus 2 by RT PCR: NEGATIVE

## 2020-02-24 LAB — COMPREHENSIVE METABOLIC PANEL
ALT: 13 U/L (ref 0–44)
AST: 17 U/L (ref 15–41)
Albumin: 3 g/dL — ABNORMAL LOW (ref 3.5–5.0)
Alkaline Phosphatase: 98 U/L (ref 38–126)
Anion gap: 10 (ref 5–15)
BUN: 6 mg/dL (ref 6–20)
CO2: 31 mmol/L (ref 22–32)
Calcium: 8.2 mg/dL — ABNORMAL LOW (ref 8.9–10.3)
Chloride: 98 mmol/L (ref 98–111)
Creatinine, Ser: 0.58 mg/dL (ref 0.44–1.00)
GFR, Estimated: >60 mL/min (ref >60)
Glucose, Bld: 107 mg/dL — ABNORMAL HIGH (ref 70–99)
Potassium: 2.5 mmol/L — CL (ref 3.5–5.1)
Sodium: 139 mmol/L (ref 135–145)
Total Bilirubin: 0.5 mg/dL (ref 0.3–1.2)
Total Protein: 7.2 g/dL (ref 6.5–8.1)

## 2020-02-24 LAB — POCT I-STAT, CHEM 8
BUN: <3 mg/dL — ABNORMAL LOW (ref 6–20)
Calcium, Ion: 0.99 mmol/L — ABNORMAL LOW (ref 1.15–1.40)
Chloride: 100 mmol/L (ref 98–111)
Creatinine, Ser: 0.4 mg/dL — ABNORMAL LOW (ref 0.44–1.00)
Glucose, Bld: 90 mg/dL (ref 70–99)
HCT: 28 % — ABNORMAL LOW (ref 36.0–46.0)
Hemoglobin: 9.5 g/dL — ABNORMAL LOW (ref 12.0–15.0)
Potassium: 2.7 mmol/L — CL (ref 3.5–5.1)
Sodium: 141 mmol/L (ref 135–145)
TCO2: 29 mmol/L (ref 22–32)

## 2020-02-24 LAB — MRSA PCR SCREENING: MRSA by PCR: NEGATIVE

## 2020-02-24 LAB — PROTIME-INR
INR: 1.1 (ref 0.8–1.2)
Prothrombin Time: 13.4 seconds (ref 11.4–15.2)

## 2020-02-24 LAB — LACTIC ACID, PLASMA: Lactic Acid, Venous: 1.5 mmol/L (ref 0.5–1.9)

## 2020-02-24 LAB — MAGNESIUM: Magnesium: 1.8 mg/dL (ref 1.7–2.4)

## 2020-02-24 SURGERY — IRRIGATION AND DEBRIDEMENT ABSCESS
Anesthesia: General | Site: Abdomen

## 2020-02-24 MED ORDER — HYDROMORPHONE HCL 1 MG/ML IJ SOLN: 0.2500 mg | INTRAMUSCULAR | Status: DC | PRN

## 2020-02-24 MED ORDER — METRONIDAZOLE IN NACL 5-0.79 MG/ML-% IV SOLN
500.0000 mg | Freq: Once | INTRAVENOUS | Status: AC
  Administered 2020-02-24: 500 mg via INTRAVENOUS
  Filled 2020-02-24: qty 100

## 2020-02-24 MED ORDER — VANCOMYCIN HCL 2000 MG/400ML IV SOLN
2000.0000 mg | INTRAVENOUS | Status: AC
  Administered 2020-02-24: 2000 mg via INTRAVENOUS
  Filled 2020-02-24 (×2): qty 400

## 2020-02-24 MED ORDER — BUPIVACAINE-EPINEPHRINE (PF) 0.25% -1:200000 IJ SOLN
INTRAMUSCULAR | Status: AC
  Filled 2020-02-24: qty 30

## 2020-02-24 MED ORDER — FENTANYL CITRATE (PF) 250 MCG/5ML IJ SOLN
INTRAMUSCULAR | Status: DC | PRN
  Administered 2020-02-24: 100 ug via INTRAVENOUS

## 2020-02-24 MED ORDER — ZOLPIDEM TARTRATE 5 MG PO TABS: 5.0000 mg | ORAL_TABLET | Freq: Every evening | ORAL | Status: DC | PRN

## 2020-02-24 MED ORDER — PROPOFOL 10 MG/ML IV BOLUS
INTRAVENOUS | Status: DC | PRN
  Administered 2020-02-24: 150 mg via INTRAVENOUS

## 2020-02-24 MED ORDER — ROCURONIUM BROMIDE 10 MG/ML (PF) SYRINGE
PREFILLED_SYRINGE | INTRAVENOUS | Status: AC
  Filled 2020-02-24: qty 10

## 2020-02-24 MED ORDER — LIDOCAINE HCL (PF) 2 % IJ SOLN
INTRAMUSCULAR | Status: AC
  Filled 2020-02-24: qty 5

## 2020-02-24 MED ORDER — INSULIN ASPART 100 UNIT/ML ~~LOC~~ SOLN: 0.0000 [IU] | Freq: Every day | SUBCUTANEOUS | Status: DC

## 2020-02-24 MED ORDER — SODIUM CHLORIDE 0.9 % IV SOLN
1.0000 g | Freq: Once | INTRAVENOUS | Status: AC
  Administered 2020-02-24: 1 g via INTRAVENOUS
  Filled 2020-02-24: qty 10

## 2020-02-24 MED ORDER — POTASSIUM CHLORIDE 10 MEQ/100ML IV SOLN
10.0000 meq | INTRAVENOUS | Status: AC
  Administered 2020-02-24 (×4): 10 meq via INTRAVENOUS
  Filled 2020-02-24: qty 100

## 2020-02-24 MED ORDER — PIPERACILLIN-TAZOBACTAM 3.375 G IVPB 30 MIN
3.3750 g | Freq: Three times a day (TID) | INTRAVENOUS | Status: DC
  Administered 2020-02-24 – 2020-02-27 (×9): 3.375 g via INTRAVENOUS
  Filled 2020-02-24 (×18): qty 50

## 2020-02-24 MED ORDER — ONDANSETRON HCL 4 MG/2ML IJ SOLN
INTRAMUSCULAR | Status: DC | PRN
  Administered 2020-02-24: 4 mg via INTRAVENOUS

## 2020-02-24 MED ORDER — HYDROCHLOROTHIAZIDE 12.5 MG PO CAPS
12.5000 mg | ORAL_CAPSULE | Freq: Every day | ORAL | Status: DC
Start: 2020-02-24 — End: 2020-02-27
  Administered 2020-02-25 – 2020-02-27 (×3): 12.5 mg via ORAL
  Filled 2020-02-24 (×3): qty 1

## 2020-02-24 MED ORDER — LACTATED RINGERS IV SOLN: INTRAVENOUS | Status: DC

## 2020-02-24 MED ORDER — AMISULPRIDE (ANTIEMETIC) 5 MG/2ML IV SOLN: 10.0000 mg | Freq: Once | INTRAVENOUS | Status: DC | PRN

## 2020-02-24 MED ORDER — OXYCODONE HCL 5 MG/5ML PO SOLN: 5.0000 mg | Freq: Once | ORAL | Status: DC | PRN

## 2020-02-24 MED ORDER — HYDROMORPHONE HCL 1 MG/ML IJ SOLN
1.0000 mg | INTRAMUSCULAR | Status: DC | PRN
  Administered 2020-02-25 – 2020-02-26 (×3): 1 mg via INTRAVENOUS
  Filled 2020-02-24 (×3): qty 1

## 2020-02-24 MED ORDER — ENOXAPARIN SODIUM 60 MG/0.6ML ~~LOC~~ SOLN
60.0000 mg | SUBCUTANEOUS | Status: DC
  Administered 2020-02-25 – 2020-02-27 (×3): 60 mg via SUBCUTANEOUS
  Filled 2020-02-24 (×3): qty 0.6

## 2020-02-24 MED ORDER — PROCHLORPERAZINE EDISYLATE 10 MG/2ML IJ SOLN
10.0000 mg | Freq: Four times a day (QID) | INTRAMUSCULAR | Status: DC | PRN
  Administered 2020-02-24: 10 mg via INTRAVENOUS
  Filled 2020-02-24: qty 2

## 2020-02-24 MED ORDER — SCOPOLAMINE 1 MG/3DAYS TD PT72
1.0000 | MEDICATED_PATCH | TRANSDERMAL | Status: DC
  Administered 2020-02-24: 1.5 mg via TRANSDERMAL
  Filled 2020-02-24 (×3): qty 1

## 2020-02-24 MED ORDER — FENTANYL CITRATE (PF) 250 MCG/5ML IJ SOLN
INTRAMUSCULAR | Status: AC
  Filled 2020-02-24: qty 5

## 2020-02-24 MED ORDER — POTASSIUM CHLORIDE 10 MEQ/100ML IV SOLN
10.0000 meq | INTRAVENOUS | Status: AC
  Administered 2020-02-24 (×2): 10 meq via INTRAVENOUS
  Filled 2020-02-24 (×2): qty 100

## 2020-02-24 MED ORDER — IOHEXOL 300 MG/ML  SOLN
100.0000 mL | Freq: Once | INTRAMUSCULAR | Status: AC | PRN
  Administered 2020-02-24: 100 mL via INTRAVENOUS

## 2020-02-24 MED ORDER — MIDAZOLAM HCL 5 MG/5ML IJ SOLN
INTRAMUSCULAR | Status: DC | PRN
  Administered 2020-02-24: 2 mg via INTRAVENOUS

## 2020-02-24 MED ORDER — OXYCODONE HCL 5 MG PO TABS
5.0000 mg | ORAL_TABLET | Freq: Every evening | ORAL | Status: DC | PRN
  Administered 2020-02-27: 5 mg via ORAL
  Filled 2020-02-24: qty 1

## 2020-02-24 MED ORDER — ROCURONIUM BROMIDE 10 MG/ML (PF) SYRINGE
PREFILLED_SYRINGE | INTRAVENOUS | Status: DC | PRN
  Administered 2020-02-24: 40 mg via INTRAVENOUS

## 2020-02-24 MED ORDER — OXYCODONE HCL 5 MG PO TABS: 5.0000 mg | ORAL_TABLET | Freq: Once | ORAL | Status: DC | PRN

## 2020-02-24 MED ORDER — SODIUM CHLORIDE 0.9 % IV BOLUS
30.0000 mL/kg | Freq: Once | INTRAVENOUS | Status: AC
  Administered 2020-02-24: 3741 mL via INTRAVENOUS

## 2020-02-24 MED ORDER — INSULIN ASPART 100 UNIT/ML ~~LOC~~ SOLN
0.0000 [IU] | Freq: Three times a day (TID) | SUBCUTANEOUS | Status: DC
  Administered 2020-02-25 – 2020-02-27 (×3): 3 [IU] via SUBCUTANEOUS

## 2020-02-24 MED ORDER — MEPERIDINE HCL 50 MG/ML IJ SOLN: 6.2500 mg | INTRAMUSCULAR | Status: DC | PRN

## 2020-02-24 MED ORDER — ALBUTEROL SULFATE HFA 108 (90 BASE) MCG/ACT IN AERS
2.0000 | INHALATION_SPRAY | RESPIRATORY_TRACT | Status: DC | PRN
  Filled 2020-02-24: qty 6.7

## 2020-02-24 MED ORDER — ONDANSETRON HCL 4 MG/2ML IJ SOLN
INTRAMUSCULAR | Status: AC
  Filled 2020-02-24: qty 2

## 2020-02-24 MED ORDER — MIDAZOLAM HCL 2 MG/2ML IJ SOLN
INTRAMUSCULAR | Status: AC
  Filled 2020-02-24: qty 2

## 2020-02-24 MED ORDER — POLYETHYLENE GLYCOL 3350 17 G PO PACK
17.0000 g | PACK | Freq: Every day | ORAL | Status: DC
  Administered 2020-02-25 – 2020-02-27 (×4): 17 g via ORAL
  Filled 2020-02-24 (×3): qty 1

## 2020-02-24 MED ORDER — ACETAMINOPHEN 325 MG PO TABS
650.0000 mg | ORAL_TABLET | Freq: Four times a day (QID) | ORAL | Status: DC | PRN
  Administered 2020-02-25: 650 mg via ORAL
  Filled 2020-02-24: qty 2

## 2020-02-24 MED ORDER — METHOCARBAMOL 500 MG PO TABS
1000.0000 mg | ORAL_TABLET | Freq: Three times a day (TID) | ORAL | Status: DC
  Administered 2020-02-24 – 2020-02-27 (×9): 1000 mg via ORAL
  Filled 2020-02-24 (×9): qty 2

## 2020-02-24 MED ORDER — LOSARTAN POTASSIUM-HCTZ 50-12.5 MG PO TABS: 1.0000 | ORAL_TABLET | Freq: Every day | ORAL | Status: DC

## 2020-02-24 MED ORDER — LOSARTAN POTASSIUM 50 MG PO TABS
50.0000 mg | ORAL_TABLET | Freq: Every day | ORAL | Status: DC
  Administered 2020-02-25 – 2020-02-27 (×3): 50 mg via ORAL
  Filled 2020-02-24 (×3): qty 1

## 2020-02-24 MED ORDER — SUGAMMADEX SODIUM 200 MG/2ML IV SOLN
INTRAVENOUS | Status: DC | PRN
  Administered 2020-02-24 (×2): 200 mg via INTRAVENOUS

## 2020-02-24 MED ORDER — SUCCINYLCHOLINE CHLORIDE 200 MG/10ML IV SOSY
PREFILLED_SYRINGE | INTRAVENOUS | Status: DC | PRN
  Administered 2020-02-24: 120 mg via INTRAVENOUS

## 2020-02-24 MED ORDER — SENNOSIDES-DOCUSATE SODIUM 8.6-50 MG PO TABS: 1.0000 | ORAL_TABLET | Freq: Every evening | ORAL | Status: DC | PRN

## 2020-02-24 MED ORDER — DEXAMETHASONE SODIUM PHOSPHATE 10 MG/ML IJ SOLN
INTRAMUSCULAR | Status: DC | PRN
  Administered 2020-02-24: 10 mg via INTRAVENOUS

## 2020-02-24 MED ORDER — DEXAMETHASONE SODIUM PHOSPHATE 10 MG/ML IJ SOLN
INTRAMUSCULAR | Status: AC
  Filled 2020-02-24: qty 1

## 2020-02-24 MED ORDER — 0.9 % SODIUM CHLORIDE (POUR BTL) OPTIME
TOPICAL | Status: DC | PRN
  Administered 2020-02-24: 1000 mL

## 2020-02-24 MED ORDER — ONDANSETRON HCL 4 MG/2ML IJ SOLN: 4.0000 mg | Freq: Four times a day (QID) | INTRAMUSCULAR | Status: DC | PRN

## 2020-02-24 MED ORDER — PROPOFOL 10 MG/ML IV BOLUS
INTRAVENOUS | Status: AC
  Filled 2020-02-24: qty 40

## 2020-02-24 MED ORDER — ACETAMINOPHEN 650 MG RE SUPP: 650.0000 mg | Freq: Four times a day (QID) | RECTAL | Status: DC | PRN

## 2020-02-24 MED ORDER — GABAPENTIN 300 MG PO CAPS
300.0000 mg | ORAL_CAPSULE | Freq: Three times a day (TID) | ORAL | Status: DC
  Administered 2020-02-24 – 2020-02-27 (×9): 300 mg via ORAL
  Filled 2020-02-24 (×9): qty 1

## 2020-02-24 MED ORDER — VANCOMYCIN HCL IN DEXTROSE 1-5 GM/200ML-% IV SOLN
1000.0000 mg | Freq: Two times a day (BID) | INTRAVENOUS | Status: DC
  Administered 2020-02-25 – 2020-02-26 (×3): 1000 mg via INTRAVENOUS
  Filled 2020-02-24 (×3): qty 200

## 2020-02-24 MED ORDER — LIDOCAINE 2% (20 MG/ML) 5 ML SYRINGE
INTRAMUSCULAR | Status: DC | PRN
  Administered 2020-02-24: 100 mg via INTRAVENOUS

## 2020-02-24 MED ORDER — METFORMIN HCL 500 MG PO TABS: 500.0000 mg | ORAL_TABLET | Freq: Two times a day (BID) | ORAL | Status: DC

## 2020-02-24 SURGICAL SUPPLY — 39 items
BARRIER SKIN 2 3/4 (OSTOMY) ×2 IMPLANT
BLADE HEX COATED 2.75 (ELECTRODE) ×2 IMPLANT
BLADE SURG SZ10 CARB STEEL (BLADE) ×2 IMPLANT
BNDG GAUZE ELAST 4 BULKY (GAUZE/BANDAGES/DRESSINGS) ×2 IMPLANT
COVER SURGICAL LIGHT HANDLE (MISCELLANEOUS) ×2 IMPLANT
COVER WAND RF STERILE (DRAPES) IMPLANT
DECANTER SPIKE VIAL GLASS SM (MISCELLANEOUS) IMPLANT
DERMABOND ADVANCED (GAUZE/BANDAGES/DRESSINGS)
DERMABOND ADVANCED .7 DNX12 (GAUZE/BANDAGES/DRESSINGS) IMPLANT
DRAPE LAPAROSCOPIC ABDOMINAL (DRAPES) IMPLANT
DRAPE LAPAROTOMY T 102X78X121 (DRAPES) IMPLANT
DRAPE LAPAROTOMY TRNSV 102X78 (DRAPES) IMPLANT
DRAPE SHEET LG 3/4 BI-LAMINATE (DRAPES) IMPLANT
DRSG PAD ABDOMINAL 8X10 ST (GAUZE/BANDAGES/DRESSINGS) ×2 IMPLANT
ELECT PENCIL ROCKER SW 15FT (MISCELLANEOUS) ×2 IMPLANT
ELECT REM PT RETURN 15FT ADLT (MISCELLANEOUS) ×2 IMPLANT
GAUZE SPONGE 4X4 12PLY STRL (GAUZE/BANDAGES/DRESSINGS) ×2 IMPLANT
GLOVE BIOGEL PI IND STRL 7.0 (GLOVE) ×1 IMPLANT
GLOVE BIOGEL PI IND STRL 7.5 (GLOVE) ×1 IMPLANT
GLOVE BIOGEL PI INDICATOR 7.0 (GLOVE) ×1
GLOVE BIOGEL PI INDICATOR 7.5 (GLOVE) ×1
GLOVE SURG ENC MOIS LTX SZ7 (GLOVE) ×2 IMPLANT
GOWN STRL REUS W/ TWL XL LVL3 (GOWN DISPOSABLE) ×1 IMPLANT
GOWN STRL REUS W/TWL LRG LVL3 (GOWN DISPOSABLE) ×4 IMPLANT
GOWN STRL REUS W/TWL XL LVL3 (GOWN DISPOSABLE) ×1
KIT BASIN OR (CUSTOM PROCEDURE TRAY) ×2 IMPLANT
KIT TURNOVER KIT A (KITS) ×2 IMPLANT
MARKER SKIN DUAL TIP RULER LAB (MISCELLANEOUS) ×2 IMPLANT
MAT PREVALON FULL STRYKER (MISCELLANEOUS) ×2 IMPLANT
NEEDLE HYPO 25X1 1.5 SAFETY (NEEDLE) ×2 IMPLANT
PACK BASIC VI WITH GOWN DISP (CUSTOM PROCEDURE TRAY) ×2 IMPLANT
SPONGE LAP 18X18 RF (DISPOSABLE) IMPLANT
SPONGE LAP 4X18 RFD (DISPOSABLE) IMPLANT
STAPLER VISISTAT 35W (STAPLE) IMPLANT
SUT MNCRL AB 4-0 PS2 18 (SUTURE) IMPLANT
SUT VIC AB 3-0 SH 18 (SUTURE) IMPLANT
SYR CONTROL 10ML LL (SYRINGE) ×2 IMPLANT
TOWEL OR 17X26 10 PK STRL BLUE (TOWEL DISPOSABLE) ×2 IMPLANT
TOWEL OR NON WOVEN STRL DISP B (DISPOSABLE) ×2 IMPLANT

## 2020-02-24 NOTE — ED Notes (Signed)
Pt transported to CT ?

## 2020-02-24 NOTE — ED Notes (Signed)
Patient transported to X-ray 

## 2020-02-24 NOTE — Anesthesia Procedure Notes (Signed)
Procedure Name: Intubation Date/Time: 02/24/2020 10:43 AM Performed by: Talbot Grumbling, CRNA Pre-anesthesia Checklist: Patient identified, Emergency Drugs available, Suction available and Patient being monitored Patient Re-evaluated:Patient Re-evaluated prior to induction Oxygen Delivery Method: Circle system utilized Preoxygenation: Pre-oxygenation with 100% oxygen Induction Type: IV induction Ventilation: Mask ventilation without difficulty Laryngoscope Size: Mac and 3 Grade View: Grade II Tube type: Oral Tube size: 7.5 mm Number of attempts: 1 Airway Equipment and Method: Stylet Placement Confirmation: ETT inserted through vocal cords under direct vision,  positive ETCO2 and breath sounds checked- equal and bilateral Secured at: 21 cm Tube secured with: Tape Dental Injury: Teeth and Oropharynx as per pre-operative assessment

## 2020-02-24 NOTE — Anesthesia Preprocedure Evaluation (Signed)
Anesthesia Evaluation  Patient identified by MRN, date of birth, ID band Patient awake    Reviewed: Allergy & Precautions, NPO status , Patient's Chart, lab work & pertinent test results  Airway Mallampati: II  TM Distance: >3 FB Neck ROM: Full    Dental no notable dental hx. (+) Teeth Intact, Dental Advisory Given   Pulmonary asthma ,    Pulmonary exam normal breath sounds clear to auscultation       Cardiovascular hypertension, Normal cardiovascular exam Rhythm:Regular Rate:Normal     Neuro/Psych negative neurological ROS  negative psych ROS   GI/Hepatic Neg liver ROS, GERD  ,Hx of colon CA colostomy in place   Endo/Other  Hypothyroidism Morbid obesity  Renal/GU K+ 3.7 Cr 0.61     Musculoskeletal   Abdominal (+) + obese,   Peds  Hematology  (+) anemia , Hgb 11.3 Plt 285   Anesthesia Other Findings All :latex  Reproductive/Obstetrics PCOS                             Anesthesia Physical  Anesthesia Plan  ASA: III  Anesthesia Plan: General   Post-op Pain Management:    Induction: Intravenous  PONV Risk Score and Plan: 3 and Treatment may vary due to age or medical condition, Ondansetron, Dexamethasone and Midazolam  Airway Management Planned: Oral ETT  Additional Equipment: None  Intra-op Plan:   Post-operative Plan: Extubation in OR  Informed Consent: I have reviewed the patients History and Physical, chart, labs and discussed the procedure including the risks, benefits and alternatives for the proposed anesthesia with the patient or authorized representative who has indicated his/her understanding and acceptance.     Dental advisory given  Plan Discussed with: CRNA and Anesthesiologist  Anesthesia Plan Comments:         Anesthesia Quick Evaluation

## 2020-02-24 NOTE — Progress Notes (Signed)
Subjective/Chief Complaint: Reviewed surgical history, ,has been doing well until yesterday when she developed drainage from incision, no fever, having  Bowel function, was admitted overnight   Objective: Vital signs in last 24 hours: Temp:  [98.9 F (37.2 C)-99.2 F (37.3 C)] 99.2 F (37.3 C) (12/30 0455) Pulse Rate:  [94-103] 95 (12/30 0500) Resp:  [11-27] 27 (12/30 0500) BP: (144-162)/(80-97) 157/83 (12/30 0500) SpO2:  [96 %-97 %] 96 % (12/30 0500) Weight:  [124.7 kg-129.3 kg] 129.3 kg (12/30 0244) Last BM Date: 02/24/20  Intake/Output from previous day: 12/29 0701 - 12/30 0700 In: 1300 [IV Piggyback:1300] Out: -  Intake/Output this shift: No intake/output data recorded.  GI: tender around lower portion of midline, stoma pink and functional, purulence draining from area around umbilicus, no real erythema  Lab Results:  Recent Labs    02/24/20 0223  WBC 11.7*  HGB 9.3*  HCT 29.5*  PLT 544*   BMET Recent Labs    02/24/20 0223  NA 139  K 2.5*  CL 98  CO2 31  GLUCOSE 107*  BUN 6  CREATININE 0.58  CALCIUM 8.2*   PT/INR Recent Labs    02/24/20 0223  LABPROT 13.4  INR 1.1   ABG No results for input(s): PHART, HCO3 in the last 72 hours.  Invalid input(s): PCO2, PO2  Studies/Results: DG Chest 2 View  Result Date: 02/24/2020 CLINICAL DATA:  Fever EXAM: CHEST - 2 VIEW COMPARISON:  None. FINDINGS: The heart size and mediastinal contours are within normal limits. Both lungs are clear. The visualized skeletal structures are unremarkable. IMPRESSION: No active cardiopulmonary disease. Electronically Signed   By: Deatra Robinson M.D.   On: 02/24/2020 03:06   CT ABDOMEN PELVIS W CONTRAST  Result Date: 02/24/2020 CLINICAL DATA:  Abdominal pain and fever EXAM: CT ABDOMEN AND PELVIS WITH CONTRAST TECHNIQUE: Multidetector CT imaging of the abdomen and pelvis was performed using the standard protocol following bolus administration of intravenous contrast.  CONTRAST:  OMNIPAQUE IOHEXOL 300 MG/ML  SOLN COMPARISON:  None. FINDINGS: Lower chest: The visualized heart size within normal limits. No pericardial fluid/thickening. No hiatal hernia. The visualized portions of the lungs are clear. Hepatobiliary: The liver is normal in density without focal abnormality.The main portal vein is patent. The patient is status post cholecystectomy. Pancreas: Unremarkable. No pancreatic ductal dilatation or surrounding inflammatory changes. Spleen: Normal in size without focal abnormality. Adrenals/Urinary Tract: Both adrenal glands appear normal. The kidneys and collecting system appear normal without evidence of urinary tract calculus or hydronephrosis. Bladder is unremarkable. Stomach/Bowel: The stomach is unremarkable. Within the mid abdomen there is mesenteric edema which surrounds several loops of small bowel. There is a left lower anterior abdominal wall hernia which contains a loop of colon. No definite evidence of strangulation is seen. There is a moderate amount of colonic stool which is grossly unremarkable. Vascular/Lymphatic: There are no enlarged mesenteric, retroperitoneal, or pelvic lymph nodes. No significant vascular findings are present. Reproductive: The patient is status post hysterectomy. No adnexal masses or collections seen. Other: Along the anterior abdominal wall postsurgical changes with stranding. There is non loculated fluid within the anterior abdominal wall extending to the lower anterior abdominal wall deep fascial layers. Overlying skin thickening is seen. Musculoskeletal: No acute or significant osseous findings. IMPRESSION: 1. Along the anterior abdominal wall postsurgical changes with non loculated fluid which extend to the deep fascial layers and abdominal wall musculature. This could represent postsurgical seroma, infected or sterile. 2. Edema within the mid mesentery.  Electronically Signed   By: Prudencio Pair M.D.   On: 02/24/2020 03:30     Anti-infectives: Anti-infectives (From admission, onward)   Start     Dose/Rate Route Frequency Ordered Stop   02/24/20 0230  metroNIDAZOLE (FLAGYL) IVPB 500 mg        500 mg 100 mL/hr over 60 Minutes Intravenous  Once 02/24/20 0218 02/24/20 0443   02/24/20 0230  cefTRIAXone (ROCEPHIN) 1 g in sodium chloride 0.9 % 100 mL IVPB        1 g 200 mL/hr over 30 Minutes Intravenous  Once 02/24/20 [id number] 02/24/20 0308      Assessment/Plan: S/p incisional and parastomal hernia repair with mesh 12/6 -I reviewed ct and on exam there is still fair amount of fluid which is pus when draining -plan for incision and drainage with open wound in OR today -discussed plan with patient -I made npo  Rolm Bookbinder 02/24/2020

## 2020-02-24 NOTE — ED Triage Notes (Signed)
Pt BIB EMS from home for possible sepsis. Hx of bowl obstruction surgery on 12/7. Pt states she coughed today and it felt like her incision opened. Drainage noted at incision site. Tachypneic, fever, and elevated HR.    133/108 100.39F  96% RA 120HR  40EtCO2  1G tylenol given by EMS

## 2020-02-24 NOTE — ED Notes (Signed)
Per Melvenia Beam, [place], discontinue repeat lactic acid

## 2020-02-24 NOTE — ED Provider Notes (Signed)
[facility] DEPT Provider Note   CSN: [id number] Arrival date & time: 02/24/20  0149     History Chief Complaint  Patient presents with  . Post-op Problem    Cindy Peterson is a 54 y.o. female.  Patient with history of colon CA s/p colectomy with existing colostomy, recent surgery for large bowel obstruction 12/7 presents with significant purulent drainage from the surgical incision in mid-lower abdomen first noticed tonight. She denies known fever at home. No nausea or vomiting. She reports having taken Tylenol when she went to bed around 11:00 for discomfort but denies that she has had significant abdominal pain. No urinary symptoms. No cough or congestion. She reports good colostomy output.  The history is provided by the patient and the EMS personnel. No language interpreter was used.       Past Medical History:  Diagnosis Date  . Asthma   . Colon cancer ([place])   . Hypertension   . PCOS (polycystic ovarian syndrome)     Patient Active Problem List   Diagnosis Date Noted  . 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 Constipation, chronic 01/28/2020  . Parastomal hernia 01/25/2020  . Small bowel obstruction ([place]) 01/24/2020  . IFG (impaired fasting glucose) 10/13/2019  . Iron deficiency anemia 03/05/2017  . Dry eye syndrome of both lacrimal glands 12/06/2016  . Family history of glaucoma 12/06/2016  . Hypermetropia of both eyes 12/06/2016  . Monocular exotropia of left eye 12/06/2016  . Mild persistent asthma with acute exacerbation 05/23/2016  . Elevated LFTs 04/04/2016  . Acquired hypothyroidism 09/25/2015  . Morbid obesity with BMI of 45.0-49.9, adult ([place]) 09/25/2015  . PCOS (polycystic ovarian syndrome) 09/25/2015  . Essential hypertension 09/19/2015  . Acid reflux 09/19/2015  . Normochromic normocytic anemia 09/19/2015  . Primary insomnia 09/19/2015  . Cyclical neutropenia ([place]) 01/26/2013  . Other complication of colostomy or  enterostomy 07/18/2011  . Incisional hernia 07/18/2011  . History of rectal cancer 04/20/2008  . Rectal cancer ([place]) 06/27/2007    Past Surgical History:  Procedure Laterality Date  . ABDOMINAL HYSTERECTOMY    . ABDOMINOPERINEAL PROCTOCOLECTOMY  04/20/2008   Dr Morton Stall, [place]  . COLOSTOMY  04/20/2008   Permanent colostomy  . LAPAROSCOPIC CHOLECYSTECTOMY  09/2018   Dr Samuel Germany, [place]  . LAPAROTOMY N/A 01/31/2020   Procedure: OPEN REPAIR OF A PARASTOMAL HERNIA WITH MESH WITH LYSIS OF ADHESIONS;  Surgeon: Thermon Leyland Nickola Major, MD;  Location: [facility] ORS;  Service: General;  Laterality: N/A;     OB History   No obstetric history on file.     Family History  Problem Relation Age of Onset  . Diabetes Mother   . Hypertension Mother   . Colon cancer Father     Social History   Tobacco Use  . Smoking status: Never Smoker  . Smokeless tobacco: Never Used  Substance Use Topics  . Alcohol use: Yes    Comment: rarely  . Drug use: No    Home Medications Prior to Admission medications   Medication Sig Start Date End Date Taking? Authorizing Provider  acetaminophen (TYLENOL) 325 MG tablet Take 2 tablets (650 mg total) by mouth every 6 (six) hours as needed for mild pain or fever. 02/04/20   Norm Parcel, PA-C  albuterol (VENTOLIN HFA) 108 (90 Base) MCG/ACT inhaler INHALE 2 PUFFS INTO THE LUNGS EVERY 4 HOURS AS NEEDED FOR WHEEZING OR SHORTNESS OF BREATH OR COUGH 04/15/15   [provider]  Fluticasone Propionate,  Inhal, (FLOVENT DISKUS) 250 MCG/BLIST AEPB INHALE 1 PUFF INTO THE LUNGS TWICE DAILY 01/04/20   [provider]  gabapentin (NEURONTIN) 300 MG capsule Take 1 capsule (300 mg total) by mouth 3 (three) times daily. 02/04/20   Norm Parcel, PA-C  guaiFENesin-dextromethorphan (ROBITUSSIN DM) 100-10 MG/5ML syrup Take 5 mLs by mouth every 4 (four) hours as needed for cough. 02/08/20   Allie Bossier, MD  metFORMIN (GLUCOPHAGE) 500 MG tablet Take by mouth 2  (two) times daily with a meal.    [provider]  methocarbamol (ROBAXIN) 500 MG tablet Take 2 tablets (1,000 mg total) by mouth 3 (three) times daily. 02/04/20   Barkley Boards R, PA-C  montelukast (SINGULAIR) 10 MG tablet TAKE 1 TABLET(10 MG) BY MOUTH EVERY NIGHT 11/02/19   [provider]  ondansetron (ZOFRAN) 4 MG tablet Take 1 tablet (4 mg total) by mouth every 6 (six) hours as needed for nausea. 02/08/20   Allie Bossier, MD  oxyCODONE (OXY IR/ROXICODONE) 5 MG immediate release tablet Take 1-2 tablets (5-10 mg total) by mouth every 6 (six) hours as needed for moderate pain or severe pain. 02/04/20   Norm Parcel, PA-C  polyethylene glycol (MIRALAX / GLYCOLAX) 17 g packet Take 17 g by mouth daily. 02/09/20   Allie Bossier, MD  zolpidem (AMBIEN) 5 MG tablet Take 5 mg by mouth at bedtime as needed for sleep.    [provider]    Allergies    Morphine and related, Latex, and Tape  Review of Systems   Review of Systems  Constitutional: Negative for chills and fever.  HENT: Negative.   Respiratory: Negative.   Cardiovascular: Negative.   Gastrointestinal: Positive for abdominal pain. Negative for nausea and vomiting.  Genitourinary: Negative.   Musculoskeletal: Negative.  Negative for myalgias.  Skin: Positive for color change and wound.  Neurological: Negative.  Negative for weakness.  Psychiatric/Behavioral: Negative for confusion.    Physical Exam Updated Vital Signs There were no vitals taken for this visit.  Physical Exam Vitals and nursing note reviewed.  Constitutional:      General: She is not in acute distress.    Appearance: Normal appearance. She is obese.  HENT:     Head: Normocephalic.  Eyes:     Conjunctiva/sclera: Conjunctivae normal.  Cardiovascular:     Rate and Rhythm: Regular rhythm. Tachycardia present.     Heart sounds: No murmur heard.   Pulmonary:     Effort: Pulmonary effort is normal.     Breath sounds: No  wheezing, rhonchi or rales.  Chest:     Chest wall: No tenderness.  Abdominal:     Comments: Significant purulent, bloody drainage in lower abdomen at surgical incision. There is surrounding erythema but no significant abdominal tenderness to palpation. Colostomy in LLQ.  Musculoskeletal:        General: Normal range of motion.     Cervical back: Normal range of motion and neck supple.  Skin:    General: Skin is dry.     Findings: Erythema present.  Neurological:     General: No focal deficit present.     Mental Status: She is alert and oriented to person, place, and time.     ED Results / Procedures / Treatments   Labs (all labs ordered are listed, but only abnormal results are displayed) Labs Reviewed  CULTURE, BLOOD (ROUTINE X 2)  CULTURE, BLOOD (ROUTINE X 2)  RESP PANEL BY RT-PCR (FLU A&B, COVID)  ARPGX2  COMPREHENSIVE METABOLIC PANEL  LACTIC ACID, PLASMA  LACTIC ACID, PLASMA  CBC WITH DIFFERENTIAL/PLATELET  PROTIME-INR  URINALYSIS, ROUTINE W REFLEX MICROSCOPIC  I-STAT BETA HCG BLOOD, ED ([facility], [facility], [facility] ONLY)   Results for orders placed or performed during the hospital encounter of 02/24/20  Resp Panel by RT-PCR (Flu A&B, Covid) Nasopharyngeal Swab   Specimen: Nasopharyngeal Swab; Nasopharyngeal(NP) swabs in vial transport medium  Result Value Ref Range   SARS Coronavirus 2 by RT PCR NEGATIVE NEGATIVE   Influenza A by PCR NEGATIVE NEGATIVE   Influenza B by PCR NEGATIVE NEGATIVE  Comprehensive metabolic panel  Result Value Ref Range   Sodium 139 135 - 145 mmol/L   Potassium 2.5 (LL) 3.5 - 5.1 mmol/L   Chloride 98 98 - 111 mmol/L   CO2 31 22 - 32 mmol/L   Glucose, Bld 107 (H) 70 - 99 mg/dL   BUN 6 6 - 20 mg/dL   Creatinine, Ser 7.32 0.44 - 1.00 mg/dL   Calcium 8.2 (L) 8.9 - 10.3 mg/dL   Total Protein 7.2 6.5 - 8.1 g/dL   Albumin 3.0 (L) 3.5 - 5.0 g/dL   AST 17 15 - 41 U/L   ALT 13 0 - 44 U/L   Alkaline Phosphatase 98 38 - 126 U/L   Total Bilirubin 0.5 0.3 - 1.2  mg/dL   GFR, Estimated >20 >25 mL/min   Anion gap 10 5 - 15  Lactic acid, plasma  Result Value Ref Range   Lactic Acid, Venous 1.5 0.5 - 1.9 mmol/L  CBC with Differential  Result Value Ref Range   WBC 11.7 (H) 4.0 - 10.5 K/uL   RBC 3.49 (L) 3.87 - 5.11 MIL/uL   Hemoglobin 9.3 (L) 12.0 - 15.0 g/dL   HCT 42.7 (L) 06.2 - 37.6 %   MCV 84.5 80.0 - 100.0 fL   MCH 26.6 26.0 - 34.0 pg   MCHC 31.5 30.0 - 36.0 g/dL   RDW 28.3 15.1 - 76.1 %   Platelets 544 (H) 150 - 400 K/uL   nRBC 0.0 0.0 - 0.2 %   Neutrophils Relative % 70 %   Neutro Abs 8.2 (H) 1.7 - 7.7 K/uL   Lymphocytes Relative 19 %   Lymphs Abs 2.2 0.7 - 4.0 K/uL   Monocytes Relative 9 %   Monocytes Absolute 1.0 0.1 - 1.0 K/uL   Eosinophils Relative 1 %   Eosinophils Absolute 0.1 0.0 - 0.5 K/uL   Basophils Relative 0 %   Basophils Absolute 0.0 0.0 - 0.1 K/uL   Immature Granulocytes 1 %   Abs Immature Granulocytes 0.08 (H) 0.00 - 0.07 K/uL  Protime-INR  Result Value Ref Range   Prothrombin Time 13.4 11.4 - 15.2 seconds   INR 1.1 0.8 - 1.2    EKG None  Radiology No results found. DG Chest 2 View  Result Date: 02/24/2020 CLINICAL DATA:  Fever EXAM: CHEST - 2 VIEW COMPARISON:  None. FINDINGS: The heart size and mediastinal contours are within normal limits. Both lungs are clear. The visualized skeletal structures are unremarkable. IMPRESSION: No active cardiopulmonary disease. Electronically Signed   By: Deatra Robinson M.D.   On: 02/24/2020 03:06   DG Abd 1 View  Result Date: 02/05/2020 CLINICAL DATA:  Nausea and vomiting. EXAM: ABDOMEN - 1 VIEW COMPARISON:  01/31/2020 and prior FINDINGS: Gas is seen within nondilated bowel loops. Moderate colorectal stool burden. No evidence of pneumoperitoneum. Right upper quadrant surgical clips. Multilevel spondylosis. IMPRESSION: Nonobstructive bowel gas  pattern.  Moderate stool burden. Electronically Signed   By: Primitivo Gauze M.D.   On: 02/05/2020 07:57   DG Abd 1  View  Result Date: 01/31/2020 CLINICAL DATA:  Nasogastric tube placement. EXAM: ABDOMEN - 1 VIEW COMPARISON:  January 31, 2020 (5:18 p.m.) FINDINGS: A nasogastric tube is seen with its distal tip overlying the body of the stomach. This is stable in position when compared to the prior study. The bowel gas pattern is normal. No radio-opaque calculi or other significant radiographic abnormality are seen. Radiopaque surgical clips are seen overlying the right upper quadrant. IMPRESSION: Nasogastric tube positioning, as described above. Electronically Signed   By: Virgina Norfolk M.D.   On: 01/31/2020 20:40   X-ray abdomen AP  Result Date: 01/31/2020 CLINICAL DATA:  Status post nasogastric tube placement. EXAM: ABDOMEN - 1 VIEW COMPARISON:  January 30, 2020. FINDINGS: The bowel gas pattern is normal. Distal tip of nasogastric tube is seen in proximal small bowel. No radio-opaque calculi or other significant radiographic abnormality are seen. IMPRESSION: Distal tip of nasogastric tube seen in proximal small bowel. Electronically Signed   By: Marijo Conception M.D.   On: 01/31/2020 17:55   DG Abd 1 View  Result Date: 01/25/2020 CLINICAL DATA:  NG placement assessment EXAM: ABDOMEN - 1 VIEW COMPARISON:  Radiograph 01/25/2020 FINDINGS: Radiograph encompasses much of the chest and portion of the upper abdomen. This is not a complete assessment of the abdomen itself. A transesophageal tube is seen coiling in the left upper quadrant with tip and side port distal to the GE junction. Small amount of high attenuation material is present in the gastric lumen as well, likely retained contrast media. Small-bowel dilatation seen on comparison abdominal radiographs is less well assessed given incomplete visualization of the abdomen. Atelectatic changes are present in the lungs. The cardiomediastinal contours are unremarkable. IMPRESSION: 1. A transesophageal tube is seen coiling in the left upper quadrant with tip and side  port distal to the GE junction. 2. Small amount of high attenuation material in the gastric lumen, likely retained contrast media. Electronically Signed   By: Lovena Le M.D.   On: 01/25/2020 23:05   CT ABDOMEN PELVIS W CONTRAST  Result Date: 02/24/2020 CLINICAL DATA:  Abdominal pain and fever EXAM: CT ABDOMEN AND PELVIS WITH CONTRAST TECHNIQUE: Multidetector CT imaging of the abdomen and pelvis was performed using the standard protocol following bolus administration of intravenous contrast. CONTRAST:  116mL OMNIPAQUE IOHEXOL 300 MG/ML  SOLN COMPARISON:  None. FINDINGS: Lower chest: The visualized heart size within normal limits. No pericardial fluid/thickening. No hiatal hernia. The visualized portions of the lungs are clear. Hepatobiliary: The liver is normal in density without focal abnormality.The main portal vein is patent. The patient is status post cholecystectomy. Pancreas: Unremarkable. No pancreatic ductal dilatation or surrounding inflammatory changes. Spleen: Normal in size without focal abnormality. Adrenals/Urinary Tract: Both adrenal glands appear normal. The kidneys and collecting system appear normal without evidence of urinary tract calculus or hydronephrosis. Bladder is unremarkable. Stomach/Bowel: The stomach is unremarkable. Within the mid abdomen there is mesenteric edema which surrounds several loops of small bowel. There is a left lower anterior abdominal wall hernia which contains a loop of colon. No definite evidence of strangulation is seen. There is a moderate amount of colonic stool which is grossly unremarkable. Vascular/Lymphatic: There are no enlarged mesenteric, retroperitoneal, or pelvic lymph nodes. No significant vascular findings are present. Reproductive: The patient is status post hysterectomy. No adnexal masses or collections  seen. Other: Along the anterior abdominal wall postsurgical changes with stranding. There is non loculated fluid within the anterior abdominal  wall extending to the lower anterior abdominal wall deep fascial layers. Overlying skin thickening is seen. Musculoskeletal: No acute or significant osseous findings. IMPRESSION: 1. Along the anterior abdominal wall postsurgical changes with non loculated fluid which extend to the deep fascial layers and abdominal wall musculature. This could represent postsurgical seroma, infected or sterile. 2. Edema within the mid mesentery. Electronically Signed   By: Prudencio Pair M.D.   On: 02/24/2020 03:30   DG Abd 2 Views  Result Date: 01/30/2020 CLINICAL DATA:  Constipation.  Small bowel obstruction. EXAM: ABDOMEN - 2 VIEW COMPARISON:  January 27, 2020 FINDINGS: A mildly dilated loop of small bowel seen in the left lateral abdomen with apparent associated wall thickening. An air-fluid level is identified in these loops of bowel on the upright imaging. There is contrast in the ascending colon and air in the descending colon. An NG tube terminates in the region of the distal stomach or proximal duodenum. No other acute abnormalities. No free air, portal venous gas, or pneumatosis. IMPRESSION: Continued small bowel obstruction. A dilated loop of small bowel left abdomen appears to demonstrate some wall thickening. The NG tube distal tip is either in the distal stomach or proximal duodenum. Electronically Signed   By: Dorise Bullion III M.D   On: 01/30/2020 13:25   DG Abd Portable 1V  Result Date: 01/27/2020 CLINICAL DATA:  Check gastric catheter placement EXAM: PORTABLE ABDOMEN - 1 VIEW COMPARISON:  None. FINDINGS: Gastric catheter is noted within the stomach. Free air is noted. Scattered large and small bowel gas is noted. IMPRESSION: Gastric catheter within the stomach. Electronically Signed   By: Inez Catalina M.D.   On: 01/27/2020 23:24   DG Abd Portable 1V  Result Date: 01/27/2020 CLINICAL DATA:  Small-bowel obstruction. EXAM: PORTABLE ABDOMEN - 1 VIEW COMPARISON:  01/25/2020.  CT 01/24/2020. FINDINGS: NG tube  noted stomach. Surgical clips right upper quadrant. Persistent dilated loop of small bowel noted. Stool noted in the colon. No free air identified. Degenerative change lumbar spine. IMPRESSION: NG tube noted the stomach. Persistent dilated loop of small bowel noted. Stool noted in the colon. Reference is made to CT report 01/24/2020. Electronically Signed   By: Marcello Moores  Register   On: 01/27/2020 06:19   DG Abd Portable 1V-Small Bowel Obstruction Protocol-initial, 8 hr delay  Result Date: 01/25/2020 CLINICAL DATA:  8 hour follow-up exam for small-bowel obstruction. EXAM: PORTABLE ABDOMEN - 1 VIEW COMPARISON:  Film from earlier in the same day. FINDINGS: Gastric catheter is noted within the stomach. A small amount of contrast is noted with stomach as well. Persistent small bowel dilatation is seen. The large peristomal hernia is again noted on the left. No definitive contrast is noted within the more distal small bowel. Continued follow-up is recommended. IMPRESSION: Minimal contrast within the stomach. No definitive contrast is noted within the colon. Continued follow-up is recommended. Small bowel dilatation remains. Electronically Signed   By: Inez Catalina M.D.   On: 01/25/2020 20:31   DG Abd Portable 1V-Small Bowel Protocol-Position Verification  Result Date: 01/25/2020 CLINICAL DATA:  NG tube placement. EXAM: PORTABLE ABDOMEN - 1 VIEW COMPARISON:  01/24/2020. FINDINGS: NG tube noted with tip over the stomach. Surgical clips right upper quadrant. No bowel distention. Stool noted throughout the colon. No free air. Mild bibasilar atelectasis. Degenerative change thoracolumbar spine. IMPRESSION: NG tube noted with tip over  the stomach. Electronically Signed   By: Marcello Moores  Register   On: 01/25/2020 11:41    Procedures Procedures (including critical care time)  Medications Ordered in ED Medications - No data to display  ED Course  I have reviewed the triage vital signs and the nursing  notes.  Pertinent labs & imaging results that were available during my care of the patient were reviewed by me and considered in my medical decision making (see chart for details).    MDM Rules/Calculators/A&P                          Patient to ED with purulent drainage from abdominal incision tonight. Doing well post surgery 12/7 for bowel obstruction. Took Tylenol for pain before bed tonight.   Arrives to ED tachycardic, tachypneic, low grade fever 100.1, however, took Tylenol 3 hours prior. Large amount of bloody, purulent drainage lower abdomen with surround cellulitic changes. Sepsis work up started. Rocephin and Flagyl ordered.   Lactic acid 1.5. She has defervesced well with Tylenol. Remains mildly tachycardic.   Potassium 2.5. For now, 2 runs of 10 mEq over 2 hours ordered. CT scan concerning for postsurgical seroma with fever and copious purulent drainage indicating infection. Surgery paged for consultation and admission.   Discussed with surgery, Dr. Kieth Brightly, who advises hospitalist admit and surgery will consult. Hospitalist paged. Dr. Tonie Griffith to admit.   Final Clinical Impression(s) / ED Diagnoses Final diagnoses:  None   1. Post-surgical infection  Rx / DC Orders ED Discharge Orders    None       Charlann Lange, PA-C 02/24/20 0520    Fatima Blank, MD 02/27/20 [phone]

## 2020-02-24 NOTE — H&P (Signed)
History and Physical    Cindy Peterson QPY:[id number] DOB: 1965-09-23 DOA: 02/24/2020  PCP: Jolene Provost, MD   Patient coming from: Home  Chief Complaint:   Drainage from surgical incision.   HPI: Cindy Peterson is a 54 y.o. female with medical history significant for  colon CA s/p colectomy with existing colostomy, recent surgery for large bowel obstruction 12/7. She presents with significant purulent drainage from the surgical incision in mid-lower abdomen that she first noticed tonight. She denies known fever at home but states she did feel warm when she woke up. She states she was sleeping when she rolled over onto her right side and felt warm fluid over her abdomen. No nausea or vomiting. She reports having taken Tylenol when she went to bed around 11:00 for discomfort but denies that she has had significant abdominal pain. No urinary symptoms. No cough or congestion. She reports good colostomy output.  ED Course:   He is found to have a nonloculated fluid collection in the abdominal wall on CT scan.  She is also found to have low potassium level.  Magnesium level was not checked in the emergency room.  ER physician discussed with on-call surgery who requested patient be admitted to the hospitalist service since patient was admitted to the hospital service a few weeks ago with a bowel obstruction that led to her surgical intervention  Review of Systems:  General: Reports subjective fever.  Denies weakness, weight loss, night sweats.  Denies dizziness.  Denies change in appetite HENT: Denies head trauma, headache, denies change in hearing, tinnitus.  Denies nasal congestion or bleeding.  Denies sore throat, sores in mouth.  Denies difficulty swallowing Eyes: Denies blurry vision, pain in eye, drainage.  Denies discoloration of eyes. Neck: Denies pain.  Denies swelling.  Denies pain with movement. Cardiovascular: Denies chest pain, palpitations.  Denies edema.  Denies orthopnea Respiratory:  Denies shortness of breath, cough.  Denies wheezing.  Denies sputum production Gastrointestinal: Reports abdominal pain with drainage from right side of incision site. Denies nausea, vomiting, diarrhea. Denies melena. Denies hematemesis. Musculoskeletal: Denies limitation of movement.  Denies deformity or swelling.  Denies pain.  Denies arthralgias or myalgias. Genitourinary: Denies pelvic pain.  Denies urinary frequency or hesitancy.  Denies dysuria.  Skin: Denies rash.  Denies petechiae, purpura, ecchymosis. Neurological: Denies headache.  Denies syncope.  Denies seizure activity.  Denies weakness or paresthesia.  Denies slurred speech, drooping face.  Denies visual change. Psychiatric: Denies depression, anxiety.  Denies hallucinations.  Past Medical History:  Diagnosis Date  . Asthma   . Colon cancer (HCC)   . Hypertension   . Hypothyroidism   . PCOS (polycystic ovarian syndrome)     Past Surgical History:  Procedure Laterality Date  . ABDOMINAL HYSTERECTOMY    . ABDOMINOPERINEAL PROCTOCOLECTOMY  04/20/2008   Dr Byrd Hesselbach, [place]  . COLOSTOMY  04/20/2008   Permanent colostomy  . LAPAROSCOPIC CHOLECYSTECTOMY  09/2018   Dr Elder Love, [place]  . LAPAROTOMY N/A 01/31/2020   Procedure: OPEN REPAIR OF A PARASTOMAL HERNIA WITH MESH WITH LYSIS OF ADHESIONS;  Surgeon: Dossie Der, Hyman Hopes, MD;  Location: [facility];  Service: General;  Laterality: N/A;    Social History  reports that she has never smoked. She has never used smokeless tobacco. She reports current alcohol use. She reports that she does not use drugs.  Allergies  Allergen Reactions  . Morphine And Related Nausea And Vomiting  . Morphine Nausea And Vomiting  . Latex Rash  itching  . Tape Rash    Family History  Problem Relation Age of Onset  . Diabetes Mother   . Hypertension Mother   . Colon cancer Father      Prior to Admission medications   Medication Sig Start Date End Date Taking? Authorizing Provider   albuterol (VENTOLIN HFA) 108 (90 Base) MCG/ACT inhaler Inhale 2 puffs into the lungs every 4 (four) hours as needed for wheezing or shortness of breath. 04/15/15  Yes [provider]  Fluticasone Propionate, Inhal, (FLOVENT DISKUS) 250 MCG/BLIST AEPB Inhale 1 puff into the lungs in the morning and at bedtime. 01/04/20  Yes [provider]  gabapentin (NEURONTIN) 300 MG capsule Take 1 capsule (300 mg total) by mouth 3 (three) times daily. 02/04/20  Yes Juliet Rude, PA-C  losartan-hydrochlorothiazide (HYZAAR) 50-12.5 MG tablet Take 1 tablet by mouth daily. 07/13/14  Yes [provider]  metFORMIN (GLUCOPHAGE) 500 MG tablet Take by mouth 2 (two) times daily with a meal.   Yes [provider]  methocarbamol (ROBAXIN) 500 MG tablet Take 2 tablets (1,000 mg total) by mouth 3 (three) times daily. 02/04/20  Yes Trixie Deis R, PA-C  montelukast (SINGULAIR) 10 MG tablet Take 10 mg by mouth at bedtime. 11/02/19  Yes [provider]  oxyCODONE (OXY IR/ROXICODONE) 5 MG immediate release tablet Take 1-2 tablets (5-10 mg total) by mouth every 6 (six) hours as needed for moderate pain or severe pain. Patient taking differently: Take 5 mg by mouth at bedtime as needed for moderate pain or severe pain. 02/04/20  Yes Trixie Deis R, PA-C  polyethylene glycol (MIRALAX / GLYCOLAX) 17 g packet Take 17 g by mouth daily. 02/09/20  Yes Drema Dallas, MD  zolpidem (AMBIEN) 5 MG tablet Take 5 mg by mouth at bedtime as needed for sleep.   Yes [provider]  acetaminophen (TYLENOL) 325 MG tablet Take 2 tablets (650 mg total) by mouth every 6 (six) hours as needed for mild pain or fever. 02/04/20   Juliet Rude, PA-C  guaiFENesin-dextromethorphan (ROBITUSSIN DM) 100-10 MG/5ML syrup Take 5 mLs by mouth every 4 (four) hours as needed for cough. 02/08/20   Drema Dallas, MD  ondansetron (ZOFRAN) 4 MG tablet Take 1 tablet (4 mg total) by mouth every 6 (six) hours as  needed for nausea. 02/08/20   Drema Dallas, MD    Physical Exam: Vitals:   02/24/20 0300 02/24/20 0430 02/24/20 0455 02/24/20 0500  BP: (!) 146/81 (!) 162/92  (!) 157/83  Pulse: (!) 103 94  95  Resp: 20 (!) 22  (!) 27  Temp:   99.2 F (37.3 C)   TempSrc:   Oral   SpO2: 96% 97%  96%  Weight:      Height:        Constitutional: NAD, calm, comfortable Vitals:   02/24/20 0300 02/24/20 0430 02/24/20 0455 02/24/20 0500  BP: (!) 146/81 (!) 162/92  (!) 157/83  Pulse: (!) 103 94  95  Resp: 20 (!) 22  (!) 27  Temp:   99.2 F (37.3 C)   TempSrc:   Oral   SpO2: 96% 97%  96%  Weight:      Height:       General: WDWN, Alert and oriented x3.  Eyes: EOMI, PERRL, conjunctivae normal.  Sclera nonicteric HENT:  [place]/AT, external ears normal.  Nares patent without epistasis.  Mucous membranes are moist. Posterior pharynx clear of any exudate or lesions.  Neck: Soft, normal range of motion, supple, no masses, Trachea midline Respiratory: clear to auscultation bilaterally, no wheezing, no crackles. Normal respiratory effort. No accessory muscle use.  Cardiovascular: Regular rate and rhythm, no murmurs / rubs / gallops. No extremity edema.  Abdomen: Soft, diffuse lower abdominal tenderness, nondistended, no rebound or guarding. Colostomy with pink stroma in LLQ. Surgical incision with dehiscence of right side of incision with surrounding erythema.  Induration surrounding incision.  Mild purulent drainage to palpation.  BS.  Bowel sounds normoactive Musculoskeletal: FROM. no clubbing / cyanosis. No joint deformity upper and lower extremities.  Normal muscle tone.  Skin: Warm, dry, intact no rashes, lesions, ulcers. No induration Neurologic: CN 2-12 grossly intact.  Normal speech.  Sensation intact, Strength 5/5 in all extremities.   Psychiatric: Normal judgment and insight.  Normal mood.    Labs on Admission: I have personally reviewed following labs and imaging studies  CBC: Recent Labs   Lab 02/24/20 0223  WBC 11.7*  NEUTROABS 8.2*  HGB 9.3*  HCT 29.5*  MCV 84.5  PLT 544*    Basic Metabolic Panel: Recent Labs  Lab 02/24/20 0223  NA 139  K 2.5*  CL 98  CO2 31  GLUCOSE 107*  BUN 6  CREATININE 0.58  CALCIUM 8.2*    GFR: Estimated Creatinine Clearance: 108 mL/min (by C-G formula based on SCr of 0.58 mg/dL).  Liver Function Tests: Recent Labs  Lab 02/24/20 0223  AST 17  ALT 13  ALKPHOS 98  BILITOT 0.5  PROT 7.2  ALBUMIN 3.0*    Urine analysis:    Component Value Date/Time   COLORURINE YELLOW 01/24/2020 1958   APPEARANCEUR CLEAR 01/24/2020 1958   LABSPEC 1.020 01/24/2020 1958   PHURINE 6.0 01/24/2020 [place] NEGATIVE 01/24/2020 1958   HGBUR NEGATIVE 01/24/2020 [place] NEGATIVE 01/24/2020 1958   KETONESUR 40 (A) 01/24/2020 1958   PROTEINUR NEGATIVE 01/24/2020 1958   NITRITE NEGATIVE 01/24/2020 1958   LEUKOCYTESUR TRACE (A) 01/24/2020 1958    Radiological Exams on Admission: DG Chest 2 View  Result Date: 02/24/2020 CLINICAL DATA:  Fever EXAM: CHEST - 2 VIEW COMPARISON:  None. FINDINGS: The heart size and mediastinal contours are within normal limits. Both lungs are clear. The visualized skeletal structures are unremarkable. IMPRESSION: No active cardiopulmonary disease. Electronically Signed   By: Ulyses Jarred M.D.   On: 02/24/2020 03:06   CT ABDOMEN PELVIS W CONTRAST  Result Date: 02/24/2020 CLINICAL DATA:  Abdominal pain and fever EXAM: CT ABDOMEN AND PELVIS WITH CONTRAST TECHNIQUE: Multidetector CT imaging of the abdomen and pelvis was performed using the standard protocol following bolus administration of intravenous contrast. CONTRAST:  167mL OMNIPAQUE IOHEXOL 300 MG/ML  SOLN COMPARISON:  None. FINDINGS: Lower chest: The visualized heart size within normal limits. No pericardial fluid/thickening. No hiatal hernia. The visualized portions of the lungs are clear. Hepatobiliary: The liver is normal in density without  focal abnormality.The main portal vein is patent. The patient is status post cholecystectomy. Pancreas: Unremarkable. No pancreatic ductal dilatation or surrounding inflammatory changes. Spleen: Normal in size without focal abnormality. Adrenals/Urinary Tract: Both adrenal glands appear normal. The kidneys and collecting system appear normal without evidence of urinary tract calculus or hydronephrosis. Bladder is unremarkable. Stomach/Bowel: The stomach is unremarkable. Within the mid abdomen there is mesenteric edema which surrounds several loops of small bowel. There is a left lower anterior abdominal wall hernia which contains a loop of colon. No definite evidence of strangulation is seen.  There is a moderate amount of colonic stool which is grossly unremarkable. Vascular/Lymphatic: There are no enlarged mesenteric, retroperitoneal, or pelvic lymph nodes. No significant vascular findings are present. Reproductive: The patient is status post hysterectomy. No adnexal masses or collections seen. Other: Along the anterior abdominal wall postsurgical changes with stranding. There is non loculated fluid within the anterior abdominal wall extending to the lower anterior abdominal wall deep fascial layers. Overlying skin thickening is seen. Musculoskeletal: No acute or significant osseous findings. IMPRESSION: 1. Along the anterior abdominal wall postsurgical changes with non loculated fluid which extend to the deep fascial layers and abdominal wall musculature. This could represent postsurgical seroma, infected or sterile. 2. Edema within the mid mesentery. Electronically Signed   By: Jonna Clark M.D.   On: 02/24/2020 03:30    EKG: Independently reviewed.  EKG shows sinus tachycardia with no acute ST elevation or depression.  QTc prolonged at 515  Assessment/Plan Principal Problem:   Abscess of skin of abdomen Ms. Provencio is admitted to MedSurg floor. Pt was given Rocephin and flagyl in the ER for intraabdominal  infection but the infection is in the abdominal wall to deep fascia but not in the intraabdominal cavity.  Continue on Rocephin for antibiotic coverage but does not require flagyl. Discussed with the pharmacist.  Pain control provided. Surgery to consult this am and provide recommendation for infection of surgical incision site.   Active Problems:   Hypokalemia Check Magnesium level as this was not done in the ER. Replete magnesium if it is low. Replete potassium.  If magnesium is low will need to be replaced first.    Essential hypertension  Continue home medications of Hyzaar.  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 Stoma pink and looks good.     Morbid obesity with BMI of 50.0-59.9, adult Follow-up with PCP for dietary lifestyle inventions for weight loss    Prolonged QT interval Avoid medications which could further prolong QT    Thrombocytosis Recheck CBC in morning.  Lovenox for anticoagulation    DVT prophylaxis: Lovenox for DVT prophylaxis.  Code Status:   Full code Family Communication:  Diagnosis and plan discussed with patient.  Patient verbalized understanding agrees with plan.  Further recommendations to follow as clinical indicated Disposition Plan:   Patient is from:  Home  Anticipated DC to:  Home  Anticipated DC date:  Anticipate at least 2 midnight stay in the hospital  Anticipated DC barriers: No barriers to discharge identified at this time  Consults called:  Surgery-Dr. Sheliah Hatch Admission status:  Inpatient   Claudean Severance Jaksen Fiorella MD [facility]  How to contact the [facility] Attending or Consulting provider 7A - 7P or covering provider during after hours 7P -7A, for this patient?   1. Check the care team in [facility] and look for a) attending/consulting [facility] provider listed and b) the [facility] Gastroenterology team listed 2. Log into [email or web address] and use [place]'s universal password to access. If you do not have the password, please contact the hospital  operator. 3. Locate the [facility] provider you are looking for under [facility] and page to a number that you can be directly reached. 4. If you still have difficulty reaching the provider, please page the [facility] (Director on Call) for the Hospitalists listed on amion for assistance.  02/24/2020, 5:44 AM

## 2020-02-24 NOTE — Transfer of Care (Signed)
Immediate Anesthesia Transfer of Care Note  Patient: Cindy Peterson  Procedure(s) Performed: IRRIGATION AND DEBRIDEMENT ABDOMINAL WOUND INFECTION (N/A Abdomen)  Patient Location: PACU  Anesthesia Type:General  Level of Consciousness: awake, alert  and oriented  Airway & Oxygen Therapy: Patient Spontanous Breathing and Patient connected to face mask oxygen  Post-op Assessment: Report given to RN and Post -op Vital signs reviewed and stable  Post vital signs: Reviewed and stable  Last Vitals:  Vitals Value Taken Time  BP    Temp    Pulse 106 02/24/20 1114  Resp 15 02/24/20 1114  SpO2 98 % 02/24/20 1114  Vitals shown include unvalidated device data.  Last Pain:  Vitals:   02/24/20 0942  TempSrc: Oral  PainSc:       Patients Stated Pain Goal: 1 (02/24/20 0753)  Complications: No complications documented.

## 2020-02-24 NOTE — Progress Notes (Addendum)
Pharmacy Antibiotic Note  Cindy Peterson is a 54 y.o. female s/p incisional and parastomal hernia repair with mesh 12/6 admitted on 02/24/2020 with purulent drainage from incision site now s/p I&D.  Pharmacy has been consulted for vancomycin dosing.  Plan: Vancomycin 2000 mg iv once followed by 1000 mg iv q 12 hours Predicted AUC 508, predicted Cssmin 15.3 using SCr 0.8  Zosyn 3.375 g EI q 8 hours per surgery  Will f/u renal function, culture results, and clinical course Levels if indicated   Height: 5\' 3"  (160 cm) Weight: 129.3 kg (285 lb) IBW/kg (Calculated) : 52.4  Temp (24hrs), Avg:99 F (37.2 C), Min:98.7 F (37.1 C), Max:99.2 F (37.3 C)  Recent Labs  Lab 02/24/20 0223 02/24/20 1008  WBC 11.7*  --   CREATININE 0.58 0.40*  LATICACIDVEN 1.5  --     Estimated Creatinine Clearance: 108 mL/min (A) (by C-G formula based on SCr of 0.4 mg/dL (L)).    Allergies  Allergen Reactions  . Morphine And Related Nausea And Vomiting  . Latex Rash    itching  . Tape Rash    Antimicrobials this admission: 12/30 ceftriaxone x 1 12/30 Flagyl x 1 12/30 vancomycin >>  12/30 Zosyn >>  Dose adjustments this admission:  Microbiology results: 12/30 Resp panel: COVID: neg, FLU: neg 12/30 BCx:  12/30 wound culture:   12/30  MRSA PCR: negative  Thank you for allowing pharmacy to be a part of this patient's care.  1/31 D 02/24/2020 11:44 AM

## 2020-02-24 NOTE — ED Notes (Addendum)
Date and time results received: 02/24/20 0336  Test: K Critical Value: 2.5  Name of Provider Notified: Melvenia Beam PA  Orders Received? Or Actions Taken?: Informed provider

## 2020-02-24 NOTE — Op Note (Signed)
Preop diagnosis: abdominal wound infection s/p mesh repair incisional and parastomal hernia Postop diagnosis: saa Procedure: Incision and drainage of abdominal wound infection Dr Harden Mo Anesthesia general EBL minimal Complications none Drains none Specimens: cultures to microbiology Sponge and needle count correct dispo recovery stable  Indications: 54 yof morbidly obese who underwent a mesh repair of incisional and parastomal hernias on 12/6.  She has done well until yesterday and she developed purulent drainage. She has small hole draining pus and was unable to evacuate this at bedside. I discussed going to OR.  Procedure: After informed consent obtained patient taken to OR. She had received antibiotics. SCDs were in place.  She was placed under general anesthesia without complication. She was prepped and draped in standard sterile fashion. Timeout performed  I was able to probe near the umbilicus and enter a large pocket of purulence. Cultures were taken.  The tissue was viable around this.  I opened the incision about 8 cm as this was entire collection of pus.  On the right side the mesh is visible as there has been breakdown of the tissue. The pus did track above the mesh on the right side.  I then irrigated. For now I think this needs to just have wet to dry dressing.

## 2020-02-24 NOTE — ED Notes (Signed)
This RN and Raelene Bott, EMT cleaned pt and wound with wound cleanser and warm water. Changed ostomy bag. Measured and cut opening to 35mm. Placed wet to dry dressing over wound and covered with large abd pad.

## 2020-02-24 NOTE — Anesthesia Postprocedure Evaluation (Signed)
Anesthesia Post Note  Patient: Cindy Peterson  Procedure(s) Performed: IRRIGATION AND DEBRIDEMENT ABDOMINAL WOUND INFECTION (N/A Abdomen)     Patient location during evaluation: PACU Anesthesia Type: General Level of consciousness: awake and alert Pain management: pain level controlled Vital Signs Assessment: post-procedure vital signs reviewed and stable Respiratory status: spontaneous breathing, nonlabored ventilation and respiratory function stable Cardiovascular status: blood pressure returned to baseline and stable Postop Assessment: no apparent nausea or vomiting Anesthetic complications: no   No complications documented.  Last Vitals:  Vitals:   02/24/20 1145 02/24/20 1203  BP: (!) 141/87 (!) 144/92  Pulse: 89 92  Resp: (!) 21   Temp: 36.8 C 36.6 C  SpO2: 100% 90%    Last Pain:  Vitals:   02/24/20 1203  TempSrc: Oral  PainSc:                  Lowella Curb

## 2020-02-25 ENCOUNTER — Encounter (HOSPITAL_COMMUNITY): Payer: Self-pay | Admitting: General Surgery

## 2020-02-25 DIAGNOSIS — E039 Hypothyroidism, unspecified: Secondary | ICD-10-CM | POA: Diagnosis not present

## 2020-02-25 DIAGNOSIS — K5909 Other constipation: Secondary | ICD-10-CM

## 2020-02-25 DIAGNOSIS — I10 Essential (primary) hypertension: Secondary | ICD-10-CM

## 2020-02-25 DIAGNOSIS — L02211 Cutaneous abscess of abdominal wall: Secondary | ICD-10-CM | POA: Diagnosis not present

## 2020-02-25 DIAGNOSIS — Z933 Colostomy status: Secondary | ICD-10-CM

## 2020-02-25 LAB — CBC
HCT: 28.1 % — ABNORMAL LOW (ref 36.0–46.0)
Hemoglobin: 8.6 g/dL — ABNORMAL LOW (ref 12.0–15.0)
MCH: 26.5 pg (ref 26.0–34.0)
MCHC: 30.6 g/dL (ref 30.0–36.0)
MCV: 86.5 fL (ref 80.0–100.0)
Platelets: 528 10*3/uL — ABNORMAL HIGH (ref 150–400)
RBC: 3.25 MIL/uL — ABNORMAL LOW (ref 3.87–5.11)
RDW: 15.9 % — ABNORMAL HIGH (ref 11.5–15.5)
WBC: 8.7 10*3/uL (ref 4.0–10.5)
nRBC: 0 % (ref 0.0–0.2)

## 2020-02-25 LAB — BASIC METABOLIC PANEL
Anion gap: 11 (ref 5–15)
BUN: 7 mg/dL (ref 6–20)
CO2: 26 mmol/L (ref 22–32)
Calcium: 8 mg/dL — ABNORMAL LOW (ref 8.9–10.3)
Chloride: 105 mmol/L (ref 98–111)
Creatinine, Ser: 0.53 mg/dL (ref 0.44–1.00)
GFR, Estimated: >60 mL/min (ref >60)
Glucose, Bld: 114 mg/dL — ABNORMAL HIGH (ref 70–99)
Potassium: 3.2 mmol/L — ABNORMAL LOW (ref 3.5–5.1)
Sodium: 142 mmol/L (ref 135–145)

## 2020-02-25 LAB — GLUCOSE, CAPILLARY
Glucose-Capillary: 145 mg/dL — ABNORMAL HIGH (ref 70–99)
Glucose-Capillary: 110 mg/dL — ABNORMAL HIGH (ref 70–99)
Glucose-Capillary: 126 mg/dL — ABNORMAL HIGH (ref 70–99)

## 2020-02-25 MED ORDER — POTASSIUM CHLORIDE CRYS ER 20 MEQ PO TBCR
40.0000 meq | EXTENDED_RELEASE_TABLET | Freq: Once | ORAL | Status: AC
  Administered 2020-02-25: 40 meq via ORAL
  Filled 2020-02-25: qty 2

## 2020-02-25 NOTE — Progress Notes (Signed)
1 Day Post-Op   Subjective/Chief Complaint: No complaints   Objective: Vital signs in last 24 hours: Temp:  [97.7 F (36.5 C)-99.2 F (37.3 C)] 97.9 F (36.6 C) (12/31 0509) Pulse Rate:  [86-106] 92 (12/31 0509) Resp:  [15-21] 17 (12/31 0509) BP: (120-147)/(77-96) 120/77 (12/31 0509) SpO2:  [90 %-100 %] 96 % (12/31 0509) Last BM Date: 02/24/20  Intake/Output from previous day: 12/30 0701 - 12/31 0700 In: 2571.5 [P.O.:360; I.V.:1902.4; IV Piggyback:309.1] Out: 5 [Blood:5] Intake/Output this shift: No intake/output data recorded.  GI: stoma pink and functional, soft, nt, wound with brownish drainage and serous, clean with dressing change  Lab Results:  Recent Labs    02/24/20 0223 02/24/20 1008 02/25/20 0507  WBC 11.7*  --  8.7  HGB 9.3* 9.5* 8.6*  HCT 29.5* 28.0* 28.1*  PLT 544*  --  528*   BMET Recent Labs    02/24/20 0223 02/24/20 1008 02/25/20 0507  NA 139 141 142  K 2.5* 2.7* 3.2*  CL 98 100 105  CO2 31  --  26  GLUCOSE 107* 90 114*  BUN 6 <3* 7  CREATININE 0.58 0.40* 0.53  CALCIUM 8.2*  --  8.0*   PT/INR Recent Labs    02/24/20 0223  LABPROT 13.4  INR 1.1   ABG No results for input(s): PHART, HCO3 in the last 72 hours.  Invalid input(s): PCO2, PO2  Studies/Results: DG Chest 2 View  Result Date: 02/24/2020 CLINICAL DATA:  Fever EXAM: CHEST - 2 VIEW COMPARISON:  None. FINDINGS: The heart size and mediastinal contours are within normal limits. Both lungs are clear. The visualized skeletal structures are unremarkable. IMPRESSION: No active cardiopulmonary disease. Electronically Signed   By: Deatra Robinson M.D.   On: 02/24/2020 03:06   CT ABDOMEN PELVIS W CONTRAST  Result Date: 02/24/2020 CLINICAL DATA:  Abdominal pain and fever EXAM: CT ABDOMEN AND PELVIS WITH CONTRAST TECHNIQUE: Multidetector CT imaging of the abdomen and pelvis was performed using the standard protocol following bolus administration of intravenous contrast. CONTRAST:   OMNIPAQUE IOHEXOL 300 MG/ML  SOLN COMPARISON:  None. FINDINGS: Lower chest: The visualized heart size within normal limits. No pericardial fluid/thickening. No hiatal hernia. The visualized portions of the lungs are clear. Hepatobiliary: The liver is normal in density without focal abnormality.The main portal vein is patent. The patient is status post cholecystectomy. Pancreas: Unremarkable. No pancreatic ductal dilatation or surrounding inflammatory changes. Spleen: Normal in size without focal abnormality. Adrenals/Urinary Tract: Both adrenal glands appear normal. The kidneys and collecting system appear normal without evidence of urinary tract calculus or hydronephrosis. Bladder is unremarkable. Stomach/Bowel: The stomach is unremarkable. Within the mid abdomen there is mesenteric edema which surrounds several loops of small bowel. There is a left lower anterior abdominal wall hernia which contains a loop of colon. No definite evidence of strangulation is seen. There is a moderate amount of colonic stool which is grossly unremarkable. Vascular/Lymphatic: There are no enlarged mesenteric, retroperitoneal, or pelvic lymph nodes. No significant vascular findings are present. Reproductive: The patient is status post hysterectomy. No adnexal masses or collections seen. Other: Along the anterior abdominal wall postsurgical changes with stranding. There is non loculated fluid within the anterior abdominal wall extending to the lower anterior abdominal wall deep fascial layers. Overlying skin thickening is seen. Musculoskeletal: No acute or significant osseous findings. IMPRESSION: 1. Along the anterior abdominal wall postsurgical changes with non loculated fluid which extend to the deep fascial layers and abdominal wall musculature. This could  represent postsurgical seroma, infected or sterile. 2. Edema within the mid mesentery. Electronically Signed   By: Prudencio Pair M.D.   On: 02/24/2020 03:30     Anti-infectives: Anti-infectives (From admission, onward)   Start     Dose/Rate Route Frequency Ordered Stop   02/25/20 0100  vancomycin (VANCOCIN) IVPB 1000 mg/200 mL premix        1,000 mg 200 mL/hr over 60 Minutes Intravenous Every 12 hours 02/24/20 1218     02/24/20 1400  piperacillin-tazobactam (ZOSYN) IVPB 3.375 g        3.375 g 12.5 mL/hr over 4 Hours Intravenous Every 8 hours 02/24/20 1226     02/24/20 1145  vancomycin (VANCOREADY) IVPB 2000 mg/400 mL        2,000 mg 200 mL/hr over 120 Minutes Intravenous STAT 02/24/20 1135 02/24/20 1610   02/24/20 0230  metroNIDAZOLE (FLAGYL) IVPB 500 mg        500 mg 100 mL/hr over 60 Minutes Intravenous  Once 02/24/20 0218 02/24/20 0443   02/24/20 0230  cefTRIAXone (ROCEPHIN) 1 g in sodium chloride 0.9 % 100 mL IVPB        1 g 200 mL/hr over 30 Minutes Intravenous  Once 02/24/20 [id number] 02/24/20 0308      Assessment/Plan: S/p incisional and parastomal hernia repair with mesh 12/6-PS S/p incision and drainage of abdominal wound infection 12/30 -start bid dr changes -have asked toc to see if vac would be option to send home with -continue current abx- gs with wbcs, few gnr, rare gpc, rare gvr Lovenox, scds Heart healthy diet dispo pending culture results, vac decision  Rolm Bookbinder 02/25/2020

## 2020-02-25 NOTE — Progress Notes (Signed)
[facility]                                                                              Patient Demographics  Cindy Peterson, is a 54 y.o. female, DOB - 1965-03-10, [id number]  Admit date - 02/24/2020   Admitting Physician Eben Burow, MD  Outpatient Primary MD for the patient is Haimes, Youlanda Roys, MD  Outpatient specialists:   LOS - 1  days   Medical records reviewed and are as summarized below:    Chief Complaint  Patient presents with  . Post-op Problem       Brief summary   Patient is a 54 year old female with history of colon CA status post colectomy with existing colostomy, recent surgery for large bowel obstruction 12/7. She presented with significant purulent drainage from the surgical incision in mid-lower abdomen that she first noticed tonight. Denied fevers but states she did feel warm when she woke up. She states she was sleeping when she rolled over onto her right side and felt warm fluid over her abdomen. No nausea or vomiting CT scan showed nonloculated fluid collection in the abdominal wall. Patient was admitted for further work-up, surgery consulted.   Assessment & Plan    Principal Problem: Abdominal wound infection, history of incisional and parastomal hernia repair with mesh on 12/6 -General surgery consulted, underwent incision and drainage of the abdominal wound infection on 12/30, postop day #1 -Feels better today, no active nausea vomiting or worsening abdominal pain -Per surgery, start twice daily dressing changes, TOC consult if wound VAC would be an option -Continue IV vancomycin, Zosyn, prelim wound cultures showing Enterococcus  Active Problems:    Hypokalemia -Potassium 3.2, replaced p.o.    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   Weight as of this encounter: 129.3 kg.  Code Status: full code  DVT Prophylaxis:  Lovenox  Family Communication: Discussed all imaging results, lab results, explained to the patient   Disposition Plan:     Status is: Inpatient  Remains inpatient appropriate because:Inpatient level of care appropriate due to severity of illness   Dispo: The patient is from: Home              Anticipated d/c is to: Home              Anticipated d/c date is: > 3 days              Patient currently is not medically stable to d/c.  Follow cultures, needs wound VAC, surgical clearance   Time Spent in minutes 35 minutes  Procedures:  I&D on 12/30  Consultants:   General surgery  Antimicrobials:   Anti-infectives (From admission, onward)   Start     Dose/Rate Route Frequency Ordered Stop  02/25/20 0100  vancomycin (VANCOCIN) IVPB 1000 mg/200 mL premix        1,000 mg 200 mL/hr over 60 Minutes Intravenous Every 12 hours 02/24/20 1218     02/24/20 1400  piperacillin-tazobactam (ZOSYN) IVPB 3.375 g        3.375 g 12.5 mL/hr over 4 Hours Intravenous Every 8 hours 02/24/20 1226     02/24/20 1145  vancomycin (VANCOREADY) IVPB 2000 mg/400 mL        2,000 mg 200 mL/hr over 120 Minutes Intravenous STAT 02/24/20 1135 02/24/20 1610   02/24/20 0230  metroNIDAZOLE (FLAGYL) IVPB 500 mg        500 mg 100 mL/hr over 60 Minutes Intravenous  Once 02/24/20 0218 02/24/20 0443   02/24/20 0230  cefTRIAXone (ROCEPHIN) 1 g in sodium chloride 0.9 % 100 mL IVPB        1 g 200 mL/hr over 30 Minutes Intravenous  Once 02/24/20 0218 02/24/20 0308          Medications  Scheduled Meds: . enoxaparin (LOVENOX) injection  60 mg Subcutaneous Q24H  . gabapentin  300 mg Oral TID  . losartan  50 mg Oral Daily   And  .  hydrochlorothiazide  12.5 mg Oral Daily  . insulin aspart  0-20 Units Subcutaneous TID WC  . insulin aspart  0-5 Units Subcutaneous QHS  . methocarbamol  1,000 mg Oral TID  . polyethylene glycol  17 g Oral Daily  . scopolamine  1 patch Transdermal Q72H   Continuous Infusions: . lactated ringers 100 mL/hr at 02/25/20 1000  . piperacillin-tazobactam 3.375 g (02/25/20 0527)  . vancomycin 1,000 mg (02/25/20 0603)   PRN Meds:.acetaminophen **OR** acetaminophen, albuterol, HYDROmorphone (DILAUDID) injection, oxyCODONE, prochlorperazine, senna-docusate, zolpidem      Subjective:   Cindy Peterson was seen and examined today.  No acute complaints, no ongoing nausea or vomiting.  Tolerating diet.  No fevers or chills.  No acute events overnight.    Objective:   Vitals:   02/24/20 1203 02/24/20 1319 02/24/20 2128 02/25/20 0509  BP: (!) 144/92 (!) 139/91 (!) 146/81 120/77  Pulse: 92 86 90 92  Resp: 18  16 17   Temp: 97.9 F (36.6 C) 98.6 F (37 C) 97.7 F (36.5 C) 97.9 F (36.6 C)  TempSrc: Oral Oral    SpO2: 90% 96% 100% 96%  Weight:      Height:        Intake/Output Summary (Last 24 hours) at 02/25/2020 1233 Last data filed at 02/25/2020 1000 Gross per 24 hour  Intake 3361.84 ml  Output 0 ml  Net 3361.84 ml     Wt Readings from Last 3 Encounters:  02/24/20 129.3 kg  01/31/20 124.7 kg  04/05/17 122.5 kg     Exam  General: Alert and oriented x 3, NAD  Cardiovascular: S1 S2 auscultated, no murmurs, RRR  Respiratory: Clear to auscultation bilaterally, no wheezing, rales or rhonchi  Gastrointestinal: Soft, stoma pink, functional, dressing intact, clean  Ext: no pedal edema bilaterally  Neuro: no new deficits  Musculoskeletal: No digital cyanosis, clubbing  Skin: No rashes  Psych: Normal affect and demeanor, alert and oriented x3    Data Reviewed:  I have personally reviewed following labs and imaging studies  Micro Results Recent Results (from the past  240 hour(s))  Resp Panel by RT-PCR (Flu A&B, Covid) Nasopharyngeal Swab     Status: None   Collection Time: 02/24/20  1:54 AM   Specimen: Nasopharyngeal Swab; Nasopharyngeal(NP) swabs in  vial transport medium  Result Value Ref Range Status   SARS Coronavirus 2 by RT PCR NEGATIVE NEGATIVE Final    Comment: (NOTE) SARS-CoV-2 target nucleic acids are NOT DETECTED.  The SARS-CoV-2 RNA is generally detectable in upper respiratory specimens during the acute phase of infection. The lowest concentration of SARS-CoV-2 viral copies this assay can detect is 138 copies/mL. A negative result does not preclude SARS-Cov-2 infection and should not be used as the sole basis for treatment or other patient management decisions. A negative result may occur with  improper specimen collection/handling, submission of specimen other than nasopharyngeal swab, presence of viral mutation(s) within the areas targeted by this assay, and inadequate number of viral copies(<138 copies/mL). A negative result must be combined with clinical observations, patient history, and epidemiological information. The expected result is Negative.  Fact Sheet for Patients:  BloggerCourse.com  Fact Sheet for Healthcare Providers:  SeriousBroker.it  This test is no t yet approved or cleared by the [place] FDA and  has been authorized for detection and/or diagnosis of SARS-CoV-2 by FDA under an Emergency Use Authorization (EUA). This EUA will remain  in effect (meaning this test can be used) for the duration of the COVID-19 declaration under Section 564(b)(1) of the Act, 21 U.S.C.section 360bbb-3(b)(1), unless the authorization is terminated  or revoked sooner.       Influenza A by PCR NEGATIVE NEGATIVE Final   Influenza B by PCR NEGATIVE NEGATIVE Final    Comment: (NOTE) The Xpert Xpress SARS-CoV-2/FLU/RSV plus assay is intended as an aid in the diagnosis of influenza  from Nasopharyngeal swab specimens and should not be used as a sole basis for treatment. Nasal washings and aspirates are unacceptable for Xpert Xpress SARS-CoV-2/FLU/RSV testing.  Fact Sheet for Patients: BloggerCourse.com  Fact Sheet for Healthcare Providers: SeriousBroker.it  This test is not yet approved or cleared by the [place] FDA and has been authorized for detection and/or diagnosis of SARS-CoV-2 by FDA under an Emergency Use Authorization (EUA). This EUA will remain in effect (meaning this test can be used) for the duration of the COVID-19 declaration under Section 564(b)(1) of the Act, 21 U.S.C. section 360bbb-3(b)(1), unless the authorization is terminated or revoked.  Performed at [facility], [address], [place], [place] [address]   Culture, blood (Routine x 2)     Status: None (Preliminary result)   Collection Time: 02/24/20  2:32 AM   Specimen: BLOOD  Result Value Ref Range Status   Specimen Description   Final    BLOOD LEFT ANTECUBITAL Performed at [facility], [address] [place], [place] [address]    Special Requests   Final    BOTTLES DRAWN AEROBIC AND ANAEROBIC Blood Culture results may not be optimal due to an excessive volume of blood received in culture bottles Performed at [facility], [address], [place], [place] [address]    Culture   Final    NO GROWTH < 24 HOURS Performed at [facility], [address], [place], [place] [address]    Report Status PENDING  Incomplete  Culture, blood (Routine x 2)     Status: None (Preliminary result)   Collection Time: 02/24/20  2:47 AM   Specimen: BLOOD  Result Value Ref Range Status   Specimen Description   Final    BLOOD RIGHT ANTECUBITAL Performed at [facility], [address] [place], [place] [address]    Special Requests  Final    BOTTLES DRAWN  AEROBIC AND ANAEROBIC Blood Culture adequate volume Performed at [facility] [address] [place], [place] [address]    Culture   Final    NO GROWTH < 24 HOURS Performed at [facility] [address] [place], [place] [address]    Report Status PENDING  Incomplete  MRSA PCR Screening     Status: None   Collection Time: 02/24/20  9:31 AM   Specimen: Nasopharyngeal  Result Value Ref Range Status   MRSA by PCR NEGATIVE NEGATIVE Final    Comment:        The GeneXpert MRSA Assay (FDA approved for NASAL specimens only), is one component of a comprehensive MRSA colonization surveillance program. It is not intended to diagnose MRSA infection nor to guide or monitor treatment for MRSA infections. Performed at [facility], [place] [address], [place], [place] [address]   Aerobic/Anaerobic Culture (surgical/deep wound)     Status: None (Preliminary result)   Collection Time: 02/24/20 11:05 AM   Specimen: PATH Other; Tissue  Result Value Ref Range Status   Specimen Description   Final    ABSCESS ABDOMEN Performed at [facility] [address], [place], [place] [address]    Special Requests   Final    NONE Performed at [facility], [place] [address], [place], [place] [address]    Gram Stain   Final    ABUNDANT WBC PRESENT,BOTH PMN AND MONONUCLEAR FEW GRAM NEGATIVE RODS RARE GRAM POSITIVE COCCI RARE GRAM VARIABLE ROD    Culture   Final    FEW ENTEROCOCCUS FAECALIS CULTURE REINCUBATED FOR BETTER GROWTH SUSCEPTIBILITIES TO FOLLOW Performed at [facility], [place] [address], [place], [place] [address]    Report Status PENDING  Incomplete    Radiology Reports DG Chest 2 View  Result Date: 02/24/2020 CLINICAL DATA:  Fever EXAM: CHEST - 2 VIEW COMPARISON:  None. FINDINGS: The heart size and mediastinal contours are within normal limits. Both lungs are clear. The visualized skeletal structures  are unremarkable. IMPRESSION: No active cardiopulmonary disease. Electronically Signed   By: Ulyses Jarred M.D.   On: 02/24/2020 03:06   DG Abd 1 View  Result Date: 02/05/2020 CLINICAL DATA:  Nausea and vomiting. EXAM: ABDOMEN - 1 VIEW COMPARISON:  01/31/2020 and prior FINDINGS: Gas is seen within nondilated bowel loops. Moderate colorectal stool burden. No evidence of pneumoperitoneum. Right upper quadrant surgical clips. Multilevel spondylosis. IMPRESSION: Nonobstructive bowel gas pattern.  Moderate stool burden. Electronically Signed   By: Primitivo Gauze M.D.   On: 02/05/2020 07:57   DG Abd 1 View  Result Date: 01/31/2020 CLINICAL DATA:  Nasogastric tube placement. EXAM: ABDOMEN - 1 VIEW COMPARISON:  January 31, 2020 (5:18 p.m.) FINDINGS: A nasogastric tube is seen with its distal tip overlying the body of the stomach. This is stable in position when compared to the prior study. The bowel gas pattern is normal. No radio-opaque calculi or other significant radiographic abnormality are seen. Radiopaque surgical clips are seen overlying the right upper quadrant. IMPRESSION: Nasogastric tube positioning, as described above. Electronically Signed   By: Virgina Norfolk M.D.   On: 01/31/2020 20:40   X-ray abdomen AP  Result Date: 01/31/2020 CLINICAL DATA:  Status post nasogastric tube placement. EXAM: ABDOMEN - 1 VIEW COMPARISON:  January 30, 2020. FINDINGS: The bowel gas pattern is normal. Distal tip of nasogastric tube is seen in proximal small bowel. No  radio-opaque calculi or other significant radiographic abnormality are seen. IMPRESSION: Distal tip of nasogastric tube seen in proximal small bowel. Electronically Signed   By: Marijo Conception M.D.   On: 01/31/2020 17:55   CT ABDOMEN PELVIS W CONTRAST  Result Date: 02/24/2020 CLINICAL DATA:  Abdominal pain and fever EXAM: CT ABDOMEN AND PELVIS WITH CONTRAST TECHNIQUE: Multidetector CT imaging of the abdomen and pelvis was performed using the  standard protocol following bolus administration of intravenous contrast. CONTRAST:  126mL OMNIPAQUE IOHEXOL 300 MG/ML  SOLN COMPARISON:  None. FINDINGS: Lower chest: The visualized heart size within normal limits. No pericardial fluid/thickening. No hiatal hernia. The visualized portions of the lungs are clear. Hepatobiliary: The liver is normal in density without focal abnormality.The main portal vein is patent. The patient is status post cholecystectomy. Pancreas: Unremarkable. No pancreatic ductal dilatation or surrounding inflammatory changes. Spleen: Normal in size without focal abnormality. Adrenals/Urinary Tract: Both adrenal glands appear normal. The kidneys and collecting system appear normal without evidence of urinary tract calculus or hydronephrosis. Bladder is unremarkable. Stomach/Bowel: The stomach is unremarkable. Within the mid abdomen there is mesenteric edema which surrounds several loops of small bowel. There is a left lower anterior abdominal wall hernia which contains a loop of colon. No definite evidence of strangulation is seen. There is a moderate amount of colonic stool which is grossly unremarkable. Vascular/Lymphatic: There are no enlarged mesenteric, retroperitoneal, or pelvic lymph nodes. No significant vascular findings are present. Reproductive: The patient is status post hysterectomy. No adnexal masses or collections seen. Other: Along the anterior abdominal wall postsurgical changes with stranding. There is non loculated fluid within the anterior abdominal wall extending to the lower anterior abdominal wall deep fascial layers. Overlying skin thickening is seen. Musculoskeletal: No acute or significant osseous findings. IMPRESSION: 1. Along the anterior abdominal wall postsurgical changes with non loculated fluid which extend to the deep fascial layers and abdominal wall musculature. This could represent postsurgical seroma, infected or sterile. 2. Edema within the mid mesentery.  Electronically Signed   By: Prudencio Pair M.D.   On: 02/24/2020 03:30   DG Abd 2 Views  Result Date: 01/30/2020 CLINICAL DATA:  Constipation.  Small bowel obstruction. EXAM: ABDOMEN - 2 VIEW COMPARISON:  January 27, 2020 FINDINGS: A mildly dilated loop of small bowel seen in the left lateral abdomen with apparent associated wall thickening. An air-fluid level is identified in these loops of bowel on the upright imaging. There is contrast in the ascending colon and air in the descending colon. An NG tube terminates in the region of the distal stomach or proximal duodenum. No other acute abnormalities. No free air, portal venous gas, or pneumatosis. IMPRESSION: Continued small bowel obstruction. A dilated loop of small bowel left abdomen appears to demonstrate some wall thickening. The NG tube distal tip is either in the distal stomach or proximal duodenum. Electronically Signed   By: Dorise Bullion III M.D   On: 01/30/2020 13:25   DG Abd Portable 1V  Result Date: 01/27/2020 CLINICAL DATA:  Check gastric catheter placement EXAM: PORTABLE ABDOMEN - 1 VIEW COMPARISON:  None. FINDINGS: Gastric catheter is noted within the stomach. Free air is noted. Scattered large and small bowel gas is noted. IMPRESSION: Gastric catheter within the stomach. Electronically Signed   By: Inez Catalina M.D.   On: 01/27/2020 23:24   DG Abd Portable 1V  Result Date: 01/27/2020 CLINICAL DATA:  Small-bowel obstruction. EXAM: PORTABLE ABDOMEN - 1 VIEW COMPARISON:  01/25/2020.  CT  01/24/2020. FINDINGS: NG tube noted stomach. Surgical clips right upper quadrant. Persistent dilated loop of small bowel noted. Stool noted in the colon. No free air identified. Degenerative change lumbar spine. IMPRESSION: NG tube noted the stomach. Persistent dilated loop of small bowel noted. Stool noted in the colon. Reference is made to CT report 01/24/2020. Electronically Signed   By: Marcello Moores  Register   On: 01/27/2020 06:19    Lab  Data:  CBC: Recent Labs  Lab 02/24/20 0223 02/24/20 1008 02/25/20 0507  WBC 11.7*  --  8.7  NEUTROABS 8.2*  --   --   HGB 9.3* 9.5* 8.6*  HCT 29.5* 28.0* 28.1*  MCV 84.5  --  86.5  PLT 544*  --  0000000*   Basic Metabolic Panel: Recent Labs  Lab 02/24/20 0223 02/24/20 0748 02/24/20 1008 02/25/20 0507  NA 139  --  141 142  K 2.5*  --  2.7* 3.2*  CL 98  --  100 105  CO2 31  --   --  26  GLUCOSE 107*  --  90 114*  BUN 6  --  <3* 7  CREATININE 0.58  --  0.40* 0.53  CALCIUM 8.2*  --   --  8.0*  MG  --  1.8  --   --    GFR: Estimated Creatinine Clearance: 108 mL/min (by C-G formula based on SCr of 0.53 mg/dL). Liver Function Tests: Recent Labs  Lab 02/24/20 0223  AST 17  ALT 13  ALKPHOS 98  BILITOT 0.5  PROT 7.2  ALBUMIN 3.0*   No results for input(s): LIPASE, AMYLASE in the last 168 hours. No results for input(s): AMMONIA in the last 168 hours. Coagulation Profile: Recent Labs  Lab 02/24/20 0223  INR 1.1   Cardiac Enzymes: No results for input(s): CKTOTAL, CKMB, CKMBINDEX, TROPONINI in the last 168 hours. BNP (last 3 results) No results for input(s): PROBNP in the last 8760 hours. HbA1C: No results for input(s): HGBA1C in the last 72 hours. CBG: Recent Labs  Lab 02/24/20 1619 02/24/20 2131 02/25/20 1149  GLUCAP 106* 138* 145*   Lipid Profile: No results for input(s): CHOL, HDL, LDLCALC, TRIG, CHOLHDL, LDLDIRECT in the last 72 hours. Thyroid Function Tests: No results for input(s): TSH, T4TOTAL, FREET4, T3FREE, THYROIDAB in the last 72 hours. Anemia Panel: No results for input(s): VITAMINB12, FOLATE, FERRITIN, TIBC, IRON, RETICCTPCT in the last 72 hours. Urine analysis:    Component Value Date/Time   COLORURINE YELLOW 01/24/2020 1958   APPEARANCEUR CLEAR 01/24/2020 1958   LABSPEC 1.020 01/24/2020 1958   PHURINE 6.0 01/24/2020 [place] NEGATIVE 01/24/2020 [place] NEGATIVE 01/24/2020 [place] NEGATIVE 01/24/2020 1958    KETONESUR 40 (A) 01/24/2020 [place] NEGATIVE 01/24/2020 1958   NITRITE NEGATIVE 01/24/2020 1958   LEUKOCYTESUR TRACE (A) 01/24/2020 1958     Sylus Stgermain M.D. [facility] 02/25/2020, 12:33 PM   Call night coverage person covering after 7pm

## 2020-02-26 DIAGNOSIS — L02211 Cutaneous abscess of abdominal wall: Secondary | ICD-10-CM | POA: Diagnosis not present

## 2020-02-26 DIAGNOSIS — D649 Anemia, unspecified: Secondary | ICD-10-CM

## 2020-02-26 DIAGNOSIS — T8142XA Infection following a procedure, deep incisional surgical site, initial encounter: Secondary | ICD-10-CM | POA: Diagnosis not present

## 2020-02-26 DIAGNOSIS — E876 Hypokalemia: Secondary | ICD-10-CM

## 2020-02-26 DIAGNOSIS — R9431 Abnormal electrocardiogram [ECG] [EKG]: Secondary | ICD-10-CM

## 2020-02-26 DIAGNOSIS — Z933 Colostomy status: Secondary | ICD-10-CM | POA: Diagnosis not present

## 2020-02-26 DIAGNOSIS — D75839 Thrombocytosis, unspecified: Secondary | ICD-10-CM

## 2020-02-26 LAB — GLUCOSE, CAPILLARY
Glucose-Capillary: 111 mg/dL — ABNORMAL HIGH (ref 70–99)
Glucose-Capillary: 108 mg/dL — ABNORMAL HIGH (ref 70–99)

## 2020-02-26 LAB — CBC WITH DIFFERENTIAL/PLATELET
Abs Immature Granulocytes: 0.23 10*3/uL — ABNORMAL HIGH (ref 0.00–0.07)
Basophils Absolute: 0 10*3/uL (ref 0.0–0.1)
Basophils Relative: 0 %
Eosinophils Absolute: 0.1 10*3/uL (ref 0.0–0.5)
Eosinophils Relative: 2 %
HCT: 29.2 % — ABNORMAL LOW (ref 36.0–46.0)
Hemoglobin: 8.9 g/dL — ABNORMAL LOW (ref 12.0–15.0)
Immature Granulocytes: 4 %
Lymphocytes Relative: 39 %
Lymphs Abs: 2.6 10*3/uL (ref 0.7–4.0)
MCH: 26.1 pg (ref 26.0–34.0)
MCHC: 30.5 g/dL (ref 30.0–36.0)
MCV: 85.6 fL (ref 80.0–100.0)
Monocytes Absolute: 0.5 10*3/uL (ref 0.1–1.0)
Monocytes Relative: 7 %
Neutro Abs: 3.2 10*3/uL (ref 1.7–7.7)
Neutrophils Relative %: 48 %
Platelets: 591 10*3/uL — ABNORMAL HIGH (ref 150–400)
RBC: 3.41 MIL/uL — ABNORMAL LOW (ref 3.87–5.11)
RDW: 16.1 % — ABNORMAL HIGH (ref 11.5–15.5)
WBC: 6.6 10*3/uL (ref 4.0–10.5)
nRBC: 0 % (ref 0.0–0.2)

## 2020-02-26 LAB — VITAMIN B12: Vitamin B-12: 662 pg/mL (ref 180–914)

## 2020-02-26 LAB — RENAL FUNCTION PANEL
Albumin: 2.7 g/dL — ABNORMAL LOW (ref 3.5–5.0)
Anion gap: 10 (ref 5–15)
BUN: 9 mg/dL (ref 6–20)
CO2: 28 mmol/L (ref 22–32)
Calcium: 8.2 mg/dL — ABNORMAL LOW (ref 8.9–10.3)
Chloride: 102 mmol/L (ref 98–111)
Creatinine, Ser: 0.68 mg/dL (ref 0.44–1.00)
GFR, Estimated: >60 mL/min (ref >60)
Glucose, Bld: 113 mg/dL — ABNORMAL HIGH (ref 70–99)
Phosphorus: 3.5 mg/dL (ref 2.5–4.6)
Potassium: 3.1 mmol/L — ABNORMAL LOW (ref 3.5–5.1)
Sodium: 140 mmol/L (ref 135–145)

## 2020-02-26 LAB — FOLATE: Folate: 7.7 ng/mL (ref >5.9)

## 2020-02-26 LAB — IRON AND TIBC
Iron: 65 ug/dL (ref 28–170)
Saturation Ratios: 29 % (ref 10.4–31.8)
TIBC: 227 ug/dL — ABNORMAL LOW (ref 250–450)
UIBC: 162 ug/dL

## 2020-02-26 LAB — CREATININE, SERUM
Creatinine, Ser: 0.69 mg/dL (ref 0.44–1.00)
GFR, Estimated: >60 mL/min (ref >60)

## 2020-02-26 LAB — FERRITIN: Ferritin: 137 ng/mL (ref 11–307)

## 2020-02-26 LAB — MAGNESIUM: Magnesium: 1.9 mg/dL (ref 1.7–2.4)

## 2020-02-26 MED ORDER — POTASSIUM CHLORIDE CRYS ER 20 MEQ PO TBCR
40.0000 meq | EXTENDED_RELEASE_TABLET | ORAL | Status: AC
  Administered 2020-02-26 (×2): 40 meq via ORAL
  Filled 2020-02-26 (×2): qty 2

## 2020-02-26 NOTE — Progress Notes (Signed)
PROGRESS NOTE    Cindy Peterson  [id number] DOB: 04/16/1965 DOA: 02/24/2020 PCP: Karlene Einstein, MD (Confirm with patient/family/NH records and if not entered, this HAS to be entered at [facility] point of entry. "No PCP" if truly none.)   Chief Complaint  Patient presents with  . Post-op Problem    Brief Narrative:  Patient is a 55 year old female with history of colon CA status post colectomy with existing colostomy, recent surgery for large bowel obstruction 12/7. She presented with significant purulent drainage from the surgical incision in mid-lower abdomenthat shefirst noticed tonight. Denied fevers but states she did feel warm when she woke up. She states she was sleeping when she rolled over onto her right side and felt warm fluid over her abdomen. No nausea or vomiting CT scan showed nonloculated fluid collection in the abdominal wall. Patient was admitted for further work-up, surgery consulted. Patient seen by general surgery and patient underwent incision and drainage 02/24/2020. Culture sent. Patient placed empirically on IV vancomycin IV Zosyn.   Assessment & Plan:   Principal Problem:   Abscess of skin of abdomen Active Problems:   Parastomal hernia   Essential hypertension   Acquired hypothyroidism   History of rectal cancer   Colostomy in place [facility])   Constipation, chronic   Morbid obesity with BMI of 50.0-59.9, adult (HCC)   Hypokalemia   Prolonged QT interval   Thrombocytosis  1 abdominal wound infection/history of incisional and parastomal hernia repair with mesh 01/31/2020 Patient presented with complaints of abdominal pain, concern for abdominal wall infection. General surgery consulted and patient underwent incision and drainage of abdominal wound infection 02/24/2020. Patient placed empirically on IV vancomycin IV Zosyn. Clinical improvement. Wound cultures positive for Enterococcus faecalis sensitive to ampicillin, vancomycin, gentamicin. Discontinue IV  vancomycin. Continue IV Zosyn pending finalization of culture results. Per general surgery.  2. Hypokalemia K. Dur 40 mEq p.o. every 4 hours x2 doses. Follow.  3. Hypertension Continue Cozaar, HCTZ.  4. Status post colostomy Chronic. Per general surgery.  5. Prolonged QT interval Avoid QT prolongation medications. Repeat EKG. Follow.  6. Thrombocytosis Likely reactive. Improving.  7. Anemia Likely acute blood loss anemia postoperatively. Patient with no overt bleeding. Check an anemia panel. Follow H&H. Transfusion threshold hemoglobin < 7.  8. Chronic constipation Continue current bowel regimen.   DVT prophylaxis: Lovenox Code Status: Full Family Communication: Updated patient and sister at bedside. Disposition:   Status is: Inpatient    Dispo: The patient is from: Home              Anticipated d/c is to: Home              Anticipated d/c date is: 2 to 3 days.              Patient currently on IV antibiotics, cultures pending, not stable for discharge.       Consultants:   General surgery: Dr. Donne Hazel 02/24/2020  Procedures:   CT abdomen and pelvis 02/24/2020  Chest x-ray 02/24/2020  Incision and drainage of abdominal wound infection 02/24/2020 per Dr. Donne Hazel  Antimicrobials:  IV Rocephin 12/30/ 2021x1 dose  IV Zosyn 02/24/2020>>>  IV Flagyl 02/24/2020 X 1 dose  IV vancomycin 02/24/2020>>>> 02/26/2020   Subjective: Patient sitting up in chair. Denies any chest pain or shortness of breath. Improvement with abdominal pain. Feeling better than on admission. Sister at bedside.  Objective: Vitals:   02/25/20 0509 02/25/20 1319 02/25/20 2059 02/26/20 0513  BP: 120/77 (!) 148/98  139/82 136/83  Pulse: 92 86 91 83  Resp: 17  18 16   Temp: 97.9 F (36.6 C) 98.3 F (36.8 C) 98.9 F (37.2 C) 97.7 F (36.5 C)  TempSrc:   Oral Oral  SpO2: 96% 100% 97% 100%  Weight:      Height:        Intake/Output Summary (Last 24 hours) at 02/26/2020  1152 Last data filed at 02/26/2020 1000 Gross per 24 hour  Intake 4009.88 ml  Output 2100 ml  Net 1909.88 ml   Filed Weights   02/24/20 0212 02/24/20 0244  Weight: 124.7 kg 129.3 kg    Examination:  General exam: NAD Respiratory system: Clear to auscultation. Respiratory effort normal. Cardiovascular system: S1 & S2 heard, RRR. No JVD, murmurs, rubs, gallops or clicks. No pedal edema. Gastrointestinal system: Abdomen is nondistended, soft and nontender. Bandage over abdominal wound. Ostomy with brown stool. No rebound. No guarding.  Central nervous system: Alert and oriented. No focal neurological deficits. Extremities: Symmetric 5 x 5 power. Skin: No rashes, lesions or ulcers Psychiatry: Judgement and insight appear normal. Mood & affect appropriate.     Data Reviewed: I have personally reviewed following labs and imaging studies  CBC: Recent Labs  Lab 02/24/20 0223 02/24/20 1008 02/25/20 0507 02/26/20 0840  WBC 11.7*  --  8.7 6.6  NEUTROABS 8.2*  --   --  3.2  HGB 9.3* 9.5* 8.6* 8.9*  HCT 29.5* 28.0* 28.1* 29.2*  MCV 84.5  --  86.5 85.6  PLT 544*  --  528* 591*    Basic Metabolic Panel: Recent Labs  Lab 02/24/20 0223 02/24/20 0748 02/24/20 1008 02/25/20 0507 02/26/20 0334 02/26/20 0840  NA 139  --  141 142  --  140  K 2.5*  --  2.7* 3.2*  --  3.1*  CL 98  --  100 105  --  102  CO2 31  --   --  26  --  28  GLUCOSE 107*  --  90 114*  --  113*  BUN 6  --  <3* 7  --  9  CREATININE 0.58  --  0.40* 0.53 0.69 0.68  CALCIUM 8.2*  --   --  8.0*  --  8.2*  MG  --  1.8  --   --   --  1.9  PHOS  --   --   --   --   --  3.5    GFR: Estimated Creatinine Clearance: 108 mL/min (by C-G formula based on SCr of 0.68 mg/dL).  Liver Function Tests: Recent Labs  Lab 02/24/20 0223 02/26/20 0840  AST 17  --   ALT 13  --   ALKPHOS 98  --   BILITOT 0.5  --   PROT 7.2  --   ALBUMIN 3.0* 2.7*    CBG: Recent Labs  Lab 02/24/20 2131 02/25/20 1149 02/25/20 1629  02/25/20 2103 02/26/20 0803  GLUCAP 138* 145* 110* 126* 111*     Recent Results (from the past 240 hour(s))  Resp Panel by RT-PCR (Flu A&B, Covid) Nasopharyngeal Swab     Status: None   Collection Time: 02/24/20  1:54 AM   Specimen: Nasopharyngeal Swab; Nasopharyngeal(NP) swabs in vial transport medium  Result Value Ref Range Status   SARS Coronavirus 2 by RT PCR NEGATIVE NEGATIVE Final    Comment: (NOTE) SARS-CoV-2 target nucleic acids are NOT DETECTED.  The SARS-CoV-2 RNA is generally detectable in upper respiratory specimens during the acute  phase of infection. The lowest concentration of SARS-CoV-2 viral copies this assay can detect is 138 copies/mL. A negative result does not preclude SARS-Cov-2 infection and should not be used as the sole basis for treatment or other patient management decisions. A negative result may occur with  improper specimen collection/handling, submission of specimen other than nasopharyngeal swab, presence of viral mutation(s) within the areas targeted by this assay, and inadequate number of viral copies(<138 copies/mL). A negative result must be combined with clinical observations, patient history, and epidemiological information. The expected result is Negative.  Fact Sheet for Patients:  BloggerCourse.com  Fact Sheet for Healthcare Providers:  SeriousBroker.it  This test is no t yet approved or cleared by the [place] FDA and  has been authorized for detection and/or diagnosis of SARS-CoV-2 by FDA under an Emergency Use Authorization (EUA). This EUA will remain  in effect (meaning this test can be used) for the duration of the COVID-19 declaration under Section 564(b)(1) of the Act, 21 U.S.C.section 360bbb-3(b)(1), unless the authorization is terminated  or revoked sooner.       Influenza A by PCR NEGATIVE NEGATIVE Final   Influenza B by PCR NEGATIVE NEGATIVE Final    Comment:  (NOTE) The Xpert Xpress SARS-CoV-2/FLU/RSV plus assay is intended as an aid in the diagnosis of influenza from Nasopharyngeal swab specimens and should not be used as a sole basis for treatment. Nasal washings and aspirates are unacceptable for Xpert Xpress SARS-CoV-2/FLU/RSV testing.  Fact Sheet for Patients: BloggerCourse.com  Fact Sheet for Healthcare Providers: SeriousBroker.it  This test is not yet approved or cleared by the [place] FDA and has been authorized for detection and/or diagnosis of SARS-CoV-2 by FDA under an Emergency Use Authorization (EUA). This EUA will remain in effect (meaning this test can be used) for the duration of the COVID-19 declaration under Section 564(b)(1) of the Act, 21 U.S.C. section 360bbb-3(b)(1), unless the authorization is terminated or revoked.  Performed at [facility], [address], [place], [place] [address]   Culture, blood (Routine x 2)     Status: None (Preliminary result)   Collection Time: 02/24/20  2:32 AM   Specimen: BLOOD  Result Value Ref Range Status   Specimen Description   Final    BLOOD LEFT ANTECUBITAL Performed at [facility], [address] [place], [place] [address]    Special Requests   Final    BOTTLES DRAWN AEROBIC AND ANAEROBIC Blood Culture results may not be optimal due to an excessive volume of blood received in culture bottles Performed at [facility], [address] [place], [place] [address]    Culture   Final    NO GROWTH 2 DAYS Performed at [facility], [address] [place], [place] [address]    Report Status PENDING  Incomplete  Culture, blood (Routine x 2)     Status: None (Preliminary result)   Collection Time: 02/24/20  2:47 AM   Specimen: BLOOD  Result Value Ref Range Status   Specimen Description   Final    BLOOD RIGHT ANTECUBITAL Performed at [facility], [address], [place], [place] [address]    Special Requests   Final    BOTTLES DRAWN AEROBIC AND ANAEROBIC Blood Culture adequate volume Performed at [facility], [address], [place], [place] [address]    Culture   Final    NO GROWTH 2 DAYS Performed at [facility],  [address] [place], [place] [address]    Report Status PENDING  Incomplete  MRSA PCR Screening     Status: None   Collection Time: 02/24/20  9:31 AM   Specimen: Nasopharyngeal  Result Value Ref Range Status   MRSA by PCR NEGATIVE NEGATIVE Final    Comment:        The GeneXpert MRSA Assay (FDA approved for NASAL specimens only), is one component of a comprehensive MRSA colonization surveillance program. It is not intended to diagnose MRSA infection nor to guide or monitor treatment for MRSA infections. Performed at [facility], [place] [address], [place], [place] [address]   Aerobic/Anaerobic Culture (surgical/deep wound)     Status: None (Preliminary result)   Collection Time: 02/24/20 11:05 AM   Specimen: PATH Other; Tissue  Result Value Ref Range Status   Specimen Description   Final    ABSCESS ABDOMEN Performed at [place] [address], [place], [place] [address]    Special Requests   Final    NONE Performed at [facility], [place] [address], [place], [place] [address]    Gram Stain   Final    ABUNDANT WBC PRESENT,BOTH PMN AND MONONUCLEAR FEW GRAM NEGATIVE RODS RARE GRAM POSITIVE COCCI RARE GRAM VARIABLE ROD    Culture   Final    FEW ENTEROCOCCUS FAECALIS HOLDING FOR POSSIBLE ANAEROBE Performed at [facility], [address], [place],  [address]    Report Status PENDING  Incomplete   Organism ID, Bacteria ENTEROCOCCUS FAECALIS  Final      Susceptibility   Enterococcus faecalis - MIC*    AMPICILLIN <=2 SENSITIVE Sensitive     VANCOMYCIN 1 SENSITIVE Sensitive     GENTAMICIN  SYNERGY SENSITIVE Sensitive     * FEW ENTEROCOCCUS FAECALIS         Radiology Studies: No results found.      Scheduled Meds: . enoxaparin (LOVENOX) injection  60 mg Subcutaneous Q24H  . gabapentin  300 mg Oral TID  . losartan  50 mg Oral Daily   And  . hydrochlorothiazide  12.5 mg Oral Daily  . insulin aspart  0-20 Units Subcutaneous TID WC  . insulin aspart  0-5 Units Subcutaneous QHS  . methocarbamol  1,000 mg Oral TID  . polyethylene glycol  17 g Oral Daily  . potassium chloride  40 mEq Oral Q4H  . scopolamine  1 patch Transdermal Q72H   Continuous Infusions: . lactated ringers 100 mL/hr at 02/26/20 1000  . piperacillin-tazobactam 3.375 g (02/26/20 0507)  . vancomycin 1,000 mg (02/26/20 0506)     LOS: 2 days    Time spent: 35 minutes    Irine Seal, MD [facility]   To contact the attending provider between 7A-7P or the covering provider during after hours 7P-7A, please log into the web site [email or web address] and access using universal [place] password for that web site. If you do not have the password, please call the hospital operator.  02/26/2020, 11:52 AM

## 2020-02-26 NOTE — Progress Notes (Signed)
Sister in this afternoon, instructed sister on how to do pt's dressing change. She watched intently and asked questions. Will need to repeat this again tomorrow and watch sister actually perform the packing for the dressing change. Pt tolerated well, pain med given

## 2020-02-26 NOTE — Progress Notes (Signed)
2 Days Post-Op   Subjective/Chief Complaint: Feels fine, needs o2 at night (states she is due a sleep study), having bowel function, tol diet cx now with few e faecalis, susc pending  Objective: Vital signs in last 24 hours: Temp:  [97.7 F (36.5 C)-98.9 F (37.2 C)] 97.7 F (36.5 C) (01/01 0513) Pulse Rate:  [83-91] 83 (01/01 0513) Resp:  [16-18] 16 (01/01 0513) BP: (136-148)/(82-98) 136/83 (01/01 0513) SpO2:  [97 %-100 %] 100 % (01/01 0513) Last BM Date: 02/25/20  Intake/Output from previous day: 12/31 0701 - 01/01 0700 In: 4154.3 [P.O.:1800; I.V.:1800.6; IV Piggyback:553.7] Out: 1450 [Urine:1150; Stool:300] Intake/Output this shift: No intake/output data recorded.  GI: soft nontender wound granulating, I cannot see mesh at base right now, stoma pink and functional  Lab Results:  Recent Labs    02/24/20 0223 02/24/20 1008 02/25/20 0507  WBC 11.7*  --  8.7  HGB 9.3* 9.5* 8.6*  HCT 29.5* 28.0* 28.1*  PLT 544*  --  528*   BMET Recent Labs    02/24/20 0223 02/24/20 1008 02/25/20 0507 02/26/20 0334  NA 139 141 142  --   K 2.5* 2.7* 3.2*  --   CL 98 100 105  --   CO2 31  --  26  --   GLUCOSE 107* 90 114*  --   BUN 6 <3* 7  --   CREATININE 0.58 0.40* 0.53 0.69  CALCIUM 8.2*  --  8.0*  --    PT/INR Recent Labs    02/24/20 0223  LABPROT 13.4  INR 1.1   ABG No results for input(s): PHART, HCO3 in the last 72 hours.  Invalid input(s): PCO2, PO2  Studies/Results: No results found.  Anti-infectives: Anti-infectives (From admission, onward)   Start     Dose/Rate Route Frequency Ordered Stop   02/25/20 0100  vancomycin (VANCOCIN) IVPB 1000 mg/200 mL premix        1,000 mg 200 mL/hr over 60 Minutes Intravenous Every 12 hours 02/24/20 1218     02/24/20 1400  piperacillin-tazobactam (ZOSYN) IVPB 3.375 g        3.375 g 12.5 mL/hr over 4 Hours Intravenous Every 8 hours 02/24/20 1226     02/24/20 1145  vancomycin (VANCOREADY) IVPB 2000 mg/400 mL         2,000 mg 200 mL/hr over 120 Minutes Intravenous STAT 02/24/20 1135 02/24/20 1610   02/24/20 0230  metroNIDAZOLE (FLAGYL) IVPB 500 mg        500 mg 100 mL/hr over 60 Minutes Intravenous  Once 02/24/20 0218 02/24/20 0443   02/24/20 0230  cefTRIAXone (ROCEPHIN) 1 g in sodium chloride 0.9 % 100 mL IVPB        1 g 200 mL/hr over 30 Minutes Intravenous  Once 02/24/20 4008 02/24/20 0308      Assessment/Plan: S/p incisional and parastomal hernia repair with mesh 12/6-PS S/p incision and drainage of abdominal wound infection 12/30 - bid dr changes -have asked toc to see if vac would be option to send home with-they have not seen yet -continue current abx- gs with wbcs, few gnr, rare gpc, rare gvr, cx with e faecalis Lovenox, scds Heart healthy diet dispo pending culture results, vac decision  Cindy Peterson 02/26/2020

## 2020-02-27 DIAGNOSIS — L02211 Cutaneous abscess of abdominal wall: Secondary | ICD-10-CM | POA: Diagnosis not present

## 2020-02-27 LAB — BASIC METABOLIC PANEL
Anion gap: 10 (ref 5–15)
BUN: 7 mg/dL (ref 6–20)
CO2: 27 mmol/L (ref 22–32)
Calcium: 8.5 mg/dL — ABNORMAL LOW (ref 8.9–10.3)
Chloride: 106 mmol/L (ref 98–111)
Creatinine, Ser: 0.61 mg/dL (ref 0.44–1.00)
GFR, Estimated: >60 mL/min (ref >60)
Glucose, Bld: 121 mg/dL — ABNORMAL HIGH (ref 70–99)
Potassium: 3.7 mmol/L (ref 3.5–5.1)
Sodium: 143 mmol/L (ref 135–145)

## 2020-02-27 LAB — AEROBIC/ANAEROBIC CULTURE W GRAM STAIN (SURGICAL/DEEP WOUND)

## 2020-02-27 LAB — CBC WITH DIFFERENTIAL/PLATELET
Abs Immature Granulocytes: 0.23 10*3/uL — ABNORMAL HIGH (ref 0.00–0.07)
Basophils Absolute: 0 10*3/uL (ref 0.0–0.1)
Basophils Relative: 0 %
Eosinophils Absolute: 0.1 10*3/uL (ref 0.0–0.5)
Eosinophils Relative: 2 %
HCT: 27.6 % — ABNORMAL LOW (ref 36.0–46.0)
Hemoglobin: 8.4 g/dL — ABNORMAL LOW (ref 12.0–15.0)
Immature Granulocytes: 4 %
Lymphocytes Relative: 44 %
Lymphs Abs: 2.4 10*3/uL (ref 0.7–4.0)
MCH: 26.3 pg (ref 26.0–34.0)
MCHC: 30.4 g/dL (ref 30.0–36.0)
MCV: 86.5 fL (ref 80.0–100.0)
Monocytes Absolute: 0.5 10*3/uL (ref 0.1–1.0)
Monocytes Relative: 8 %
Neutro Abs: 2.4 10*3/uL (ref 1.7–7.7)
Neutrophils Relative %: 42 %
Platelets: 549 10*3/uL — ABNORMAL HIGH (ref 150–400)
RBC: 3.19 MIL/uL — ABNORMAL LOW (ref 3.87–5.11)
RDW: 16 % — ABNORMAL HIGH (ref 11.5–15.5)
WBC: 5.6 10*3/uL (ref 4.0–10.5)
nRBC: 0 % (ref 0.0–0.2)

## 2020-02-27 LAB — MAGNESIUM: Magnesium: 1.8 mg/dL (ref 1.7–2.4)

## 2020-02-27 LAB — GLUCOSE, CAPILLARY: Glucose-Capillary: 114 mg/dL — ABNORMAL HIGH (ref 70–99)

## 2020-02-27 MED ORDER — AMOXICILLIN-POT CLAVULANATE 875-125 MG PO TABS: 1.0000 | ORAL_TABLET | Freq: Two times a day (BID) | ORAL | 0 refills | Status: AC

## 2020-02-27 MED ORDER — AMOXICILLIN-POT CLAVULANATE 875-125 MG PO TABS
1.0000 | ORAL_TABLET | Freq: Two times a day (BID) | ORAL | Status: DC
  Administered 2020-02-27: 1 via ORAL
  Filled 2020-02-27: qty 1

## 2020-02-27 MED ORDER — OXYCODONE HCL 5 MG PO TABS: 5.0000 mg | ORAL_TABLET | Freq: Every evening | ORAL | 0 refills | Status: AC | PRN

## 2020-02-27 NOTE — Discharge Summary (Signed)
Physician Discharge Summary  [name] [id number] DOB: 18-Aug-1965 DOA: 02/24/2020  PCP: Karlene Einstein, MD  Admit date: 02/24/2020 Discharge date: 02/27/2020  Admitted From: Home  Disposition:  Home   Recommendations for Outpatient Follow-up:  1. Follow up with Dr. Donne Hazel in 4 days     Home Health: Not able to procure [facility] RN  Equipment/Devices: Gauze, saline  Discharge Condition: Fair  CODE STATUS: FULL Diet recommendation: Regular  Brief/Interim Summary: Cindy Peterson is a 55 y.o. F with hx colon cancer s/p colectomy with colostomy in place who had recent bowel obstruction requiring operative intervention.  Post op, developed purulent drainage from wound and presented with increased drainage, pain and chills.  CT scan showed nonloculated fluid collection in the abdominal wall.       PRINCIPAL HOSPITAL DIAGNOSIS: Abdominal wound infection    Discharge Diagnoses:  Abdominal wound infection Patient taken to OR for debridement on 12/30.  Cultures from OR growing enterococcus.  Anaerobes still pending.  Treated with IV antibiotics and transitioned to Augmentin at discharge with close Gen Surg follow up.  Patient and sister trained for wet-to-dry dressings, no HHRN available.  Hypokalemia Resolved  Hypertension Anemia of chronic disease Iron replete           Discharge Instructions  Discharge Instructions    Discharge instructions   Complete by: As directed    From Dr. Loleta Books: You were admitted for infection in your wound This was cleaned out in the operating room, and the sample collected from the wound was growing some bacteria. Augmentin 875-125 mg is the perfect antibiotic to kill that particular type of infection, so take this twice daily and follow up with Dr. Cristal Generous office on Jan 6 If you don't know the time of the appointment, call his office (listed below in To Do section) to ask   FOr pain, take acetaminophen 1000 mg or ibuprofen 400  mg.  Use oxycodone sparingly, and dispose of excess.  Do NOT take oxycodone with Ambien  Resume your other home medicines   Discharge wound care:   Complete by: As directed    Pack the wound twice daily with "wet to dry" dressings Place saline moistened gauze at the base, covered with more dry gauze as [name] taught you Cover with a simple covering, taped down   Increase activity slowly   Complete by: As directed      Allergies as of 02/27/2020      Reactions   Morphine And Related Nausea And Vomiting   Latex Rash   itching   Tape Rash      Medication List    TAKE these medications   acetaminophen 325 MG tablet Commonly known as: TYLENOL Take 2 tablets (650 mg total) by mouth every 6 (six) hours as needed for mild pain or fever.   albuterol 108 (90 Base) MCG/ACT inhaler Commonly known as: VENTOLIN HFA Inhale 2 puffs into the lungs every 4 (four) hours as needed for wheezing or shortness of breath.   amoxicillin-clavulanate 875-125 MG tablet Commonly known as: AUGMENTIN Take 1 tablet by mouth every 12 (twelve) hours.   Flovent Diskus 250 MCG/BLIST Aepb Generic drug: Fluticasone Propionate (Inhal) Inhale 1 puff into the lungs in the morning and at bedtime.   gabapentin 300 MG capsule Commonly known as: NEURONTIN Take 1 capsule (300 mg total) by mouth 3 (three) times daily.   guaiFENesin-dextromethorphan 100-10 MG/5ML syrup Commonly known as: ROBITUSSIN DM Take 5 mLs by mouth every 4 (four) hours  as needed for cough.   losartan-hydrochlorothiazide 50-12.5 MG tablet Commonly known as: HYZAAR Take 1 tablet by mouth daily.   metFORMIN 500 MG tablet Commonly known as: GLUCOPHAGE Take by mouth 2 (two) times daily with a meal.   methocarbamol 500 MG tablet Commonly known as: ROBAXIN Take 2 tablets (1,000 mg total) by mouth 3 (three) times daily.   montelukast 10 MG tablet Commonly known as: SINGULAIR Take 10 mg by mouth at bedtime.   ondansetron 4 MG  tablet Commonly known as: ZOFRAN Take 1 tablet (4 mg total) by mouth every 6 (six) hours as needed for nausea.   oxyCODONE 5 MG immediate release tablet Commonly known as: Oxy IR/ROXICODONE Take 1 tablet (5 mg total) by mouth at bedtime as needed for moderate pain or severe pain.   polyethylene glycol 17 g packet Commonly known as: MIRALAX / GLYCOLAX Take 17 g by mouth daily.   zolpidem 5 MG tablet Commonly known as: AMBIEN Take 5 mg by mouth at bedtime as needed for sleep.            Discharge Care Instructions  (From admission, onward)         Start     Ordered   02/27/20 0000  Discharge wound care:       Comments: Pack the wound twice daily with "wet to dry" dressings Place saline moistened gauze at the base, covered with more dry gauze as [name] taught you Cover with a simple covering, taped down   02/27/20 1357          Follow-up Information    Haimes, Youlanda Roys, MD. Schedule an appointment as soon as possible for a visit in 2 week(s).   Specialty: Family Medicine Contact information: [address] [place] [address] [phone]        Rolm Bookbinder, MD. Go to.   Specialty: General Surgery Why: On Jan 6 Contact information: [address] [place]  [address] [phone]              Allergies  Allergen Reactions  . Morphine And Related Nausea And Vomiting  . Latex Rash    itching  . Tape Rash    Consultations:  General Surgery   Procedures/Studies: DG Chest 2 View  Result Date: 02/24/2020 CLINICAL DATA:  Fever EXAM: CHEST - 2 VIEW COMPARISON:  None. FINDINGS: The heart size and mediastinal contours are within normal limits. Both lungs are clear. The visualized skeletal structures are unremarkable. IMPRESSION: No active cardiopulmonary disease. Electronically Signed   By: Ulyses Jarred M.D.   On: 02/24/2020 03:06   DG Abd 1 View  Result Date: 02/05/2020 CLINICAL DATA:  Nausea and vomiting. EXAM: ABDOMEN - 1  VIEW COMPARISON:  01/31/2020 and prior FINDINGS: Gas is seen within nondilated bowel loops. Moderate colorectal stool burden. No evidence of pneumoperitoneum. Right upper quadrant surgical clips. Multilevel spondylosis. IMPRESSION: Nonobstructive bowel gas pattern.  Moderate stool burden. Electronically Signed   By: Primitivo Gauze M.D.   On: 02/05/2020 07:57   DG Abd 1 View  Result Date: 01/31/2020 CLINICAL DATA:  Nasogastric tube placement. EXAM: ABDOMEN - 1 VIEW COMPARISON:  January 31, 2020 (5:18 p.m.) FINDINGS: A nasogastric tube is seen with its distal tip overlying the body of the stomach. This is stable in position when compared to the prior study. The bowel gas pattern is normal. No radio-opaque calculi or other significant radiographic abnormality are seen. Radiopaque surgical clips are seen overlying the right upper  quadrant. IMPRESSION: Nasogastric tube positioning, as described above. Electronically Signed   By: Virgina Norfolk M.D.   On: 01/31/2020 20:40   X-ray abdomen AP  Result Date: 01/31/2020 CLINICAL DATA:  Status post nasogastric tube placement. EXAM: ABDOMEN - 1 VIEW COMPARISON:  January 30, 2020. FINDINGS: The bowel gas pattern is normal. Distal tip of nasogastric tube is seen in proximal small bowel. No radio-opaque calculi or other significant radiographic abnormality are seen. IMPRESSION: Distal tip of nasogastric tube seen in proximal small bowel. Electronically Signed   By: Marijo Conception M.D.   On: 01/31/2020 17:55   CT ABDOMEN PELVIS W CONTRAST  Result Date: 02/24/2020 CLINICAL DATA:  Abdominal pain and fever EXAM: CT ABDOMEN AND PELVIS WITH CONTRAST TECHNIQUE: Multidetector CT imaging of the abdomen and pelvis was performed using the standard protocol following bolus administration of intravenous contrast. CONTRAST:  157mL OMNIPAQUE IOHEXOL 300 MG/ML  SOLN COMPARISON:  None. FINDINGS: Lower chest: The visualized heart size within normal limits. No pericardial  fluid/thickening. No hiatal hernia. The visualized portions of the lungs are clear. Hepatobiliary: The liver is normal in density without focal abnormality.The main portal vein is patent. The patient is status post cholecystectomy. Pancreas: Unremarkable. No pancreatic ductal dilatation or surrounding inflammatory changes. Spleen: Normal in size without focal abnormality. Adrenals/Urinary Tract: Both adrenal glands appear normal. The kidneys and collecting system appear normal without evidence of urinary tract calculus or hydronephrosis. Bladder is unremarkable. Stomach/Bowel: The stomach is unremarkable. Within the mid abdomen there is mesenteric edema which surrounds several loops of small bowel. There is a left lower anterior abdominal wall hernia which contains a loop of colon. No definite evidence of strangulation is seen. There is a moderate amount of colonic stool which is grossly unremarkable. Vascular/Lymphatic: There are no enlarged mesenteric, retroperitoneal, or pelvic lymph nodes. No significant vascular findings are present. Reproductive: The patient is status post hysterectomy. No adnexal masses or collections seen. Other: Along the anterior abdominal wall postsurgical changes with stranding. There is non loculated fluid within the anterior abdominal wall extending to the lower anterior abdominal wall deep fascial layers. Overlying skin thickening is seen. Musculoskeletal: No acute or significant osseous findings. IMPRESSION: 1. Along the anterior abdominal wall postsurgical changes with non loculated fluid which extend to the deep fascial layers and abdominal wall musculature. This could represent postsurgical seroma, infected or sterile. 2. Edema within the mid mesentery. Electronically Signed   By: Prudencio Pair M.D.   On: 02/24/2020 03:30   DG Abd 2 Views  Result Date: 01/30/2020 CLINICAL DATA:  Constipation.  Small bowel obstruction. EXAM: ABDOMEN - 2 VIEW COMPARISON:  January 27, 2020  FINDINGS: A mildly dilated loop of small bowel seen in the left lateral abdomen with apparent associated wall thickening. An air-fluid level is identified in these loops of bowel on the upright imaging. There is contrast in the ascending colon and air in the descending colon. An NG tube terminates in the region of the distal stomach or proximal duodenum. No other acute abnormalities. No free air, portal venous gas, or pneumatosis. IMPRESSION: Continued small bowel obstruction. A dilated loop of small bowel left abdomen appears to demonstrate some wall thickening. The NG tube distal tip is either in the distal stomach or proximal duodenum. Electronically Signed   By: Dorise Bullion III M.D   On: 01/30/2020 13:25       Subjective: Feeling well.  Moderate pain at wound.  No fever, no vomiting.  Appetite good.  Drainage  resolved.  Discharge Exam: Vitals:   02/27/20 0557 02/27/20 1353  BP: 130/89 (!) 148/87  Pulse: 89 88  Resp: 18 16  Temp: 98.1 F (36.7 C) 98.4 F (36.9 C)  SpO2: 100% 99%   Vitals:   02/26/20 1536 02/26/20 2108 02/27/20 0557 02/27/20 1353  BP: (!) 145/106 (!) 151/92 130/89 (!) 148/87  Pulse: 96 93 89 88  Resp: 16 18 18 16   Temp: 98.1 F (36.7 C) 98.3 F (36.8 C) 98.1 F (36.7 C) 98.4 F (36.9 C)  TempSrc: Oral Oral Oral Oral  SpO2: 100% 100% 100% 99%  Weight:      Height:        General: Pt is alert, awake, not in acute distress, siting in chair Cardiovascular: RRR, nl S1-S2, no murmurs appreciated.   No LE edema.   Respiratory: Normal respiratory rate and rhythm.  CTAB without rales or wheezes. Abdominal: Abdomen soft and non-tender.  No distension or HSM.   Neuro/Psych: Strength symmetric in upper and lower extremities.  Judgment and insight appear normal.   The results of significant diagnostics from this hospitalization (including imaging, microbiology, ancillary and laboratory) are listed below for reference.     Microbiology: Recent Results (from the  past 240 hour(s))  Resp Panel by RT-PCR (Flu A&B, Covid) Nasopharyngeal Swab     Status: None   Collection Time: 02/24/20  1:54 AM   Specimen: Nasopharyngeal Swab; Nasopharyngeal(NP) swabs in vial transport medium  Result Value Ref Range Status   SARS Coronavirus 2 by RT PCR NEGATIVE NEGATIVE Final    Comment: (NOTE) SARS-CoV-2 target nucleic acids are NOT DETECTED.  The SARS-CoV-2 RNA is generally detectable in upper respiratory specimens during the acute phase of infection. The lowest concentration of SARS-CoV-2 viral copies this assay can detect is 138 copies/mL. A negative result does not preclude SARS-Cov-2 infection and should not be used as the sole basis for treatment or other patient management decisions. A negative result may occur with  improper specimen collection/handling, submission of specimen other than nasopharyngeal swab, presence of viral mutation(s) within the areas targeted by this assay, and inadequate number of viral copies(<138 copies/mL). A negative result must be combined with clinical observations, patient history, and epidemiological information. The expected result is Negative.  Fact Sheet for Patients:  EntrepreneurPulse.com.au  Fact Sheet for Healthcare Providers:  IncredibleEmployment.be  This test is no t yet approved or cleared by the [place] FDA and  has been authorized for detection and/or diagnosis of SARS-CoV-2 by FDA under an Emergency Use Authorization (EUA). This EUA will remain  in effect (meaning this test can be used) for the duration of the COVID-19 declaration under Section 564(b)(1) of the Act, 21 U.S.C.section 360bbb-3(b)(1), unless the authorization is terminated  or revoked sooner.       Influenza A by PCR NEGATIVE NEGATIVE Final   Influenza B by PCR NEGATIVE NEGATIVE Final    Comment: (NOTE) The Xpert Xpress SARS-CoV-2/FLU/RSV plus assay is intended as an aid in the diagnosis of  influenza from Nasopharyngeal swab specimens and should not be used as a sole basis for treatment. Nasal washings and aspirates are unacceptable for Xpert Xpress SARS-CoV-2/FLU/RSV testing.  Fact Sheet for Patients: EntrepreneurPulse.com.au  Fact Sheet for Healthcare Providers: IncredibleEmployment.be  This test is not yet approved or cleared by the [place] FDA and has been authorized for detection and/or diagnosis of SARS-CoV-2 by FDA under an Emergency Use Authorization (EUA). This EUA will remain in effect (meaning this test can  be used) for the duration of the COVID-19 declaration under Section 564(b)(1) of the Act, 21 U.S.C. section 360bbb-3(b)(1), unless the authorization is terminated or revoked.  Performed at [facility], [address] [place], [place] [address]   Culture, blood (Routine x 2)     Status: None (Preliminary result)   Collection Time: 02/24/20  2:32 AM   Specimen: BLOOD  Result Value Ref Range Status   Specimen Description   Final    BLOOD LEFT ANTECUBITAL Performed at [facility], [address] [place], [place] [address]    Special Requests   Final    BOTTLES DRAWN AEROBIC AND ANAEROBIC Blood Culture results may not be optimal due to an excessive volume of blood received in culture bottles Performed at [facility] [place] [facility], [address] [place], [place] [address]    Culture   Final    NO GROWTH 3 DAYS Performed at [facility], [address], [place], [place] [address]    Report Status PENDING  Incomplete  Culture, blood (Routine x 2)     Status: None (Preliminary result)   Collection Time: 02/24/20  2:47 AM   Specimen: BLOOD  Result Value Ref Range Status   Specimen Description   Final    BLOOD RIGHT ANTECUBITAL Performed at [facility], [address] [place], [place] [address]    Special Requests   Final    BOTTLES  DRAWN AEROBIC AND ANAEROBIC Blood Culture adequate volume Performed at [facility], [address] [place], [place] [address]    Culture   Final    NO GROWTH 3 DAYS Performed at [facility], [address] [place], [place] [address]    Report Status PENDING  Incomplete  MRSA PCR Screening     Status: None   Collection Time: 02/24/20  9:31 AM   Specimen: Nasopharyngeal  Result Value Ref Range Status   MRSA by PCR NEGATIVE NEGATIVE Final    Comment:        The GeneXpert MRSA Assay (FDA approved for NASAL specimens only), is one component of a comprehensive MRSA colonization surveillance program. It is not intended to diagnose MRSA infection nor to guide or monitor treatment for MRSA infections. Performed at [facility], [address], [place], [place] [address]   Aerobic/Anaerobic Culture (surgical/deep wound)     Status: None   Collection Time: 02/24/20 11:05 AM   Specimen: PATH Other; Tissue  Result Value Ref Range Status   Specimen Description   Final    ABSCESS ABDOMEN Performed at [facility], [address] [place], [place] [address]    Special Requests   Final    NONE Performed at [facility], [address] [place], [place] [address]    Gram Stain   Final    ABUNDANT WBC PRESENT,BOTH PMN AND MONONUCLEAR FEW GRAM NEGATIVE RODS RARE GRAM POSITIVE COCCI RARE GRAM VARIABLE ROD    Culture   Final    FEW ENTEROCOCCUS FAECALIS MODERATE BACTEROIDES FRAGILIS BETA LACTAMASE POSITIVE Performed at [facility], [address] [place], [place] [address]    Report Status 02/27/2020 FINAL  Final   Organism ID, Bacteria ENTEROCOCCUS FAECALIS  Final      Susceptibility   Enterococcus faecalis - MIC*    AMPICILLIN <=2 SENSITIVE Sensitive     VANCOMYCIN 1 SENSITIVE Sensitive     GENTAMICIN SYNERGY SENSITIVE Sensitive     *  FEW ENTEROCOCCUS FAECALIS     Labs: BNP (last 3  results) No results for input(s): BNP in the last 8760 hours. Basic Metabolic Panel: Recent Labs  Lab 02/24/20 0223 02/24/20 0748 02/24/20 1008 02/25/20 0507 02/26/20 0334 02/26/20 0840 02/27/20 0509  NA 139  --  141 142  --  140 143  K 2.5*  --  2.7* 3.2*  --  3.1* 3.7  CL 98  --  100 105  --  102 106  CO2 31  --   --  26  --  28 27  GLUCOSE 107*  --  90 114*  --  113* 121*  BUN 6  --  <3* 7  --  9 7  CREATININE 0.58  --  0.40* 0.53 0.69 0.68 0.61  CALCIUM 8.2*  --   --  8.0*  --  8.2* 8.5*  MG  --  1.8  --   --   --  1.9 1.8  PHOS  --   --   --   --   --  3.5  --    Liver Function Tests: Recent Labs  Lab 02/24/20 0223 02/26/20 0840  AST 17  --   ALT 13  --   ALKPHOS 98  --   BILITOT 0.5  --   PROT 7.2  --   ALBUMIN 3.0* 2.7*   No results for input(s): LIPASE, AMYLASE in the last 168 hours. No results for input(s): AMMONIA in the last 168 hours. CBC: Recent Labs  Lab 02/24/20 0223 02/24/20 1008 02/25/20 0507 02/26/20 0840 02/27/20 0509  WBC 11.7*  --  8.7 6.6 5.6  NEUTROABS 8.2*  --   --  3.2 2.4  HGB 9.3* 9.5* 8.6* 8.9* 8.4*  HCT 29.5* 28.0* 28.1* 29.2* 27.6*  MCV 84.5  --  86.5 85.6 86.5  PLT 544*  --  528* 591* 549*   Cardiac Enzymes: No results for input(s): CKTOTAL, CKMB, CKMBINDEX, TROPONINI in the last 168 hours. BNP: Invalid input(s): POCBNP CBG: Recent Labs  Lab 02/25/20 1629 02/25/20 2103 02/26/20 0803 02/26/20 2059 02/27/20 0724  GLUCAP 110* 126* 111* 108* 114*   D-Dimer No results for input(s): DDIMER in the last 72 hours. Hgb A1c No results for input(s): HGBA1C in the last 72 hours. Lipid Profile No results for input(s): CHOL, HDL, LDLCALC, TRIG, CHOLHDL, LDLDIRECT in the last 72 hours. Thyroid function studies No results for input(s): TSH, T4TOTAL, T3FREE, THYROIDAB in the last 72 hours.  Invalid input(s): FREET3 Anemia work up Recent Labs    02/26/20 0840  VITAMINB12 662  FOLATE 7.7  FERRITIN 137  TIBC 227*  IRON 65    Urinalysis    Component Value Date/Time   COLORURINE YELLOW 01/24/2020 [place] 01/24/2020 1958   LABSPEC 1.020 01/24/2020 1958   PHURINE 6.0 01/24/2020 [place] NEGATIVE 01/24/2020 1958   HGBUR NEGATIVE 01/24/2020 [place] NEGATIVE 01/24/2020 1958   KETONESUR 40 (A) 01/24/2020 1958   PROTEINUR NEGATIVE 01/24/2020 1958   NITRITE NEGATIVE 01/24/2020 1958   LEUKOCYTESUR TRACE (A) 01/24/2020 1958   Sepsis Labs Invalid input(s): PROCALCITONIN,  WBC,  LACTICIDVEN Microbiology Recent Results (from the past 240 hour(s))  Resp Panel by RT-PCR (Flu A&B, Covid) Nasopharyngeal Swab     Status: None   Collection Time: 02/24/20  1:54 AM   Specimen: Nasopharyngeal Swab; Nasopharyngeal(NP) swabs in vial transport medium  Result Value Ref Range Status   SARS Coronavirus 2 by RT  PCR NEGATIVE NEGATIVE Final    Comment: (NOTE) SARS-CoV-2 target nucleic acids are NOT DETECTED.  The SARS-CoV-2 RNA is generally detectable in upper respiratory specimens during the acute phase of infection. The lowest concentration of SARS-CoV-2 viral copies this assay can detect is 138 copies/mL. A negative result does not preclude SARS-Cov-2 infection and should not be used as the sole basis for treatment or other patient management decisions. A negative result may occur with  improper specimen collection/handling, submission of specimen other than nasopharyngeal swab, presence of viral mutation(s) within the areas targeted by this assay, and inadequate number of viral copies(<138 copies/mL). A negative result must be combined with clinical observations, patient history, and epidemiological information. The expected result is Negative.  Fact Sheet for Patients:  BloggerCourse.com  Fact Sheet for Healthcare Providers:  SeriousBroker.it  This test is no t yet approved or cleared by the [place] FDA and  has been  authorized for detection and/or diagnosis of SARS-CoV-2 by FDA under an Emergency Use Authorization (EUA). This EUA will remain  in effect (meaning this test can be used) for the duration of the COVID-19 declaration under Section 564(b)(1) of the Act, 21 U.S.C.section 360bbb-3(b)(1), unless the authorization is terminated  or revoked sooner.       Influenza A by PCR NEGATIVE NEGATIVE Final   Influenza B by PCR NEGATIVE NEGATIVE Final    Comment: (NOTE) The Xpert Xpress SARS-CoV-2/FLU/RSV plus assay is intended as an aid in the diagnosis of influenza from Nasopharyngeal swab specimens and should not be used as a sole basis for treatment. Nasal washings and aspirates are unacceptable for Xpert Xpress SARS-CoV-2/FLU/RSV testing.  Fact Sheet for Patients: BloggerCourse.com  Fact Sheet for Healthcare Providers: SeriousBroker.it  This test is not yet approved or cleared by the [place] FDA and has been authorized for detection and/or diagnosis of SARS-CoV-2 by FDA under an Emergency Use Authorization (EUA). This EUA will remain in effect (meaning this test can be used) for the duration of the COVID-19 declaration under Section 564(b)(1) of the Act, 21 U.S.C. section 360bbb-3(b)(1), unless the authorization is terminated or revoked.  Performed at [facility], [address], [place], [place] [address]   Culture, blood (Routine x 2)     Status: None (Preliminary result)   Collection Time: 02/24/20  2:32 AM   Specimen: BLOOD  Result Value Ref Range Status   Specimen Description   Final    BLOOD LEFT ANTECUBITAL Performed at [facility], [address] [place], [place] [address]    Special Requests   Final    BOTTLES DRAWN AEROBIC AND ANAEROBIC Blood Culture results may not be optimal due to an excessive volume of blood received in culture bottles Performed at [facility], [address] [place], [place] [address]    Culture   Final    NO GROWTH 3 DAYS Performed at [facility], [address] [place], [place] [address]    Report Status PENDING  Incomplete  Culture, blood (Routine x 2)     Status: None (Preliminary result)   Collection Time: 02/24/20  2:47 AM   Specimen: BLOOD  Result Value Ref Range Status   Specimen Description   Final    BLOOD RIGHT ANTECUBITAL Performed at [facility], [address], [place], [place] [address]    Special Requests   Final    BOTTLES DRAWN AEROBIC AND ANAEROBIC Blood Culture adequate volume Performed at [facility]  [place] [address], [place], [place] [address]    Culture   Final    NO GROWTH 3 DAYS Performed at [facility], [place] [address], [place], [place] [address]    Report Status PENDING  Incomplete  MRSA PCR Screening     Status: None   Collection Time: 02/24/20  9:31 AM   Specimen: Nasopharyngeal  Result Value Ref Range Status   MRSA by PCR NEGATIVE NEGATIVE Final    Comment:        The GeneXpert MRSA Assay (FDA approved for NASAL specimens only), is one component of a comprehensive MRSA colonization surveillance program. It is not intended to diagnose MRSA infection nor to guide or monitor treatment for MRSA infections. Performed at [facility], [place] [address], [place], [place] [address]   Aerobic/Anaerobic Culture (surgical/deep wound)     Status: None   Collection Time: 02/24/20 11:05 AM   Specimen: PATH Other; Tissue  Result Value Ref Range Status   Specimen Description   Final    ABSCESS ABDOMEN Performed at [place] [address] [place], [place] [address]    Special Requests   Final    NONE Performed at [facility], [place] [address], [place], [place] [address]    Gram Stain   Final    ABUNDANT WBC PRESENT,BOTH PMN AND MONONUCLEAR FEW GRAM NEGATIVE  RODS RARE GRAM POSITIVE COCCI RARE GRAM VARIABLE ROD    Culture   Final    FEW ENTEROCOCCUS FAECALIS MODERATE BACTEROIDES FRAGILIS BETA LACTAMASE POSITIVE Performed at [facility], [place] [address], [place], [place] [address]    Report Status 02/27/2020 FINAL  Final   Organism ID, Bacteria ENTEROCOCCUS FAECALIS  Final      Susceptibility   Enterococcus faecalis - MIC*    AMPICILLIN <=2 SENSITIVE Sensitive     VANCOMYCIN 1 SENSITIVE Sensitive     GENTAMICIN SYNERGY SENSITIVE Sensitive     * FEW ENTEROCOCCUS FAECALIS     Time coordinating discharge: 25 minutes The [place] controlled substances registry was reviewed for this patient prior to filling the <5 days supply controlled substances script.      SIGNED:   Edwin Dada, MD  [facility] 02/27/2020, 6:20 PM

## 2020-02-27 NOTE — Progress Notes (Signed)
3 Days Post-Op   Subjective/Chief Complaint: Feels fine, sister saw dr change yesterday, cx with susc enterococcus (maybe anaerobe also)   Objective: Vital signs in last 24 hours: Temp:  [98.1 F (36.7 C)-98.3 F (36.8 C)] 98.1 F (36.7 C) (01/02 0557) Pulse Rate:  [89-96] 89 (01/02 0557) Resp:  [16-18] 18 (01/02 0557) BP: (130-151)/(89-106) 130/89 (01/02 0557) SpO2:  [100 %] 100 % (01/02 0557) Last BM Date: 02/26/20  Intake/Output from previous day: 01/01 0701 - 01/02 0700 In: 5355 [P.O.:2880; I.V.:2374.3; IV Piggyback:100.8] Out: 5550 [Urine:4400; Stool:1150] Intake/Output this shift: No intake/output data recorded.  Ab soft nontender stoma pink and functional wound granulating no infection currently  Lab Results:  Recent Labs    02/26/20 0840 02/27/20 0509  WBC 6.6 5.6  HGB 8.9* 8.4*  HCT 29.2* 27.6*  PLT 591* 549*   BMET Recent Labs    02/26/20 0840 02/27/20 0509  NA 140 143  K 3.1* 3.7  CL 102 106  CO2 28 27  GLUCOSE 113* 121*  BUN 9 7  CREATININE 0.68 0.61  CALCIUM 8.2* 8.5*   PT/INR No results for input(s): LABPROT, INR in the last 72 hours. ABG No results for input(s): PHART, HCO3 in the last 72 hours.  Invalid input(s): PCO2, PO2  Studies/Results: No results found.  Anti-infectives: Anti-infectives (From admission, onward)   Start     Dose/Rate Route Frequency Ordered Stop   02/27/20 1000  amoxicillin-clavulanate (AUGMENTIN) 875-125 MG per tablet 1 tablet        1 tablet Oral Every 12 hours 02/27/20 0813     02/25/20 0100  vancomycin (VANCOCIN) IVPB 1000 mg/200 mL premix  Status:  Discontinued        1,000 mg 200 mL/hr over 60 Minutes Intravenous Every 12 hours 02/24/20 1218 02/26/20 1155   02/24/20 1400  piperacillin-tazobactam (ZOSYN) IVPB 3.375 g  Status:  Discontinued        3.375 g 12.5 mL/hr over 4 Hours Intravenous Every 8 hours 02/24/20 1226 02/27/20 0812   02/24/20 1145  vancomycin (VANCOREADY) IVPB 2000 mg/400 mL        2,000  mg 200 mL/hr over 120 Minutes Intravenous STAT 02/24/20 1135 02/24/20 1610   02/24/20 0230  metroNIDAZOLE (FLAGYL) IVPB 500 mg        500 mg 100 mL/hr over 60 Minutes Intravenous  Once 02/24/20 0218 02/24/20 0443   02/24/20 0230  cefTRIAXone (ROCEPHIN) 1 g in sodium chloride 0.9 % 100 mL IVPB        1 g 200 mL/hr over 30 Minutes Intravenous  Once 02/24/20 1607 02/24/20 0308      Assessment/Plan: S/p incisional and parastomal hernia repair with mesh 12/6-PS S/p incision and drainage of abdominal wound infection 12/30 - bid dr changes -I think reasonable to send home today with wet to dry dr changes that have become quite easy and I think family can do this -will change to augmentin today and I will write for dc rx Lovenox, scds Heart healthy diet dispo f/u Jan 6 with [facility] as planned, can go home today from my standpoint  Emelia Loron 02/27/2020

## 2020-02-27 NOTE — TOC Progression Note (Signed)
Transition of Care [facility]) - Progression Note    Patient Details  Name: Cindy Peterson MRN: [id number] Date of Birth: [birth year]  [facility] CM/SW Contact  Armanda Heritage, RN Phone Number: 02/27/2020, 2:19 PM  Clinical Narrative:    CM contacted evry home health agency servicing in patient's area for [facility] services. All agencies have declined due to staffing issues and some were out of network with patient's insurance.  At this time, no HH agency is available to provide services.  MD and bedside RN were made aware of this.  Expected Discharge Plan: Home w Home Health Services Barriers to Discharge: No Barriers Identified  Expected Discharge Plan and Services Expected Discharge Plan: Home w Home Health Services         Expected Discharge Date: 02/27/20                                     Social Determinants of Health (SDOH) Interventions    Readmission Risk Interventions No flowsheet data found.

## 2020-02-27 NOTE — Progress Notes (Signed)
Assessment unchanged. Pt and sister verbalized understanding of dc instructions including medications to resume, follow up care, when to call the doctor and wound care. Sister is going to do dressing changes and received teaching yesterday. She feels confidant about preforming wound care. Pt aware that CM unable to find HHRN. Wound supplies provided. Discharged via wc to front entrance accompanied by sister and NT.

## 2020-02-27 NOTE — Discharge Instructions (Signed)
Change dressing twice daily, use saline and one 4x4 gauze, cover You may shower Take antibiotics until complete Keep current appt with [facility] on January 6

## 2020-02-29 LAB — CULTURE, BLOOD (ROUTINE X 2)
Culture: NO GROWTH
Culture: NO GROWTH
Special Requests: ADEQUATE

## 2020-03-01 LAB — GLUCOSE, CAPILLARY
Glucose-Capillary: 117 mg/dL — ABNORMAL HIGH (ref 70–99)
Glucose-Capillary: 126 mg/dL — ABNORMAL HIGH (ref 70–99)
Glucose-Capillary: 111 mg/dL — ABNORMAL HIGH (ref 70–99)
Glucose-Capillary: 128 mg/dL — ABNORMAL HIGH (ref 70–99)

## 2024-01-29 ENCOUNTER — Other Ambulatory Visit: Payer: Self-pay | Admitting: Medical Genetics

## 2024-02-03 ENCOUNTER — Other Ambulatory Visit: Payer: Self-pay | Admitting: Medical Genetics

## 2024-02-03 DIAGNOSIS — Z006 Encounter for examination for normal comparison and control in clinical research program: Secondary | ICD-10-CM

## 2024-02-13 LAB — GENECONNECT MOLECULAR SCREEN: Genetic Analysis Overall Interpretation: NEGATIVE
